# Patient Record
Sex: Female | Born: 1980 | Race: Black or African American | Hispanic: No | Marital: Married | Smoking: Never smoker
Health system: Southern US, Community
[De-identification: ages and names within clinical notes are randomized; demographics above are authoritative.]

## PROBLEM LIST (undated history)

## (undated) ENCOUNTER — Inpatient Hospital Stay (HOSPITAL_COMMUNITY): Payer: Self-pay

## (undated) DIAGNOSIS — R51 Headache: Secondary | ICD-10-CM

## (undated) DIAGNOSIS — F419 Anxiety disorder, unspecified: Secondary | ICD-10-CM

## (undated) DIAGNOSIS — F329 Major depressive disorder, single episode, unspecified: Secondary | ICD-10-CM

## (undated) DIAGNOSIS — I10 Essential (primary) hypertension: Secondary | ICD-10-CM

## (undated) DIAGNOSIS — N159 Renal tubulo-interstitial disease, unspecified: Secondary | ICD-10-CM

## (undated) DIAGNOSIS — F32A Depression, unspecified: Secondary | ICD-10-CM

## (undated) DIAGNOSIS — F909 Attention-deficit hyperactivity disorder, unspecified type: Secondary | ICD-10-CM

## (undated) DIAGNOSIS — Z87442 Personal history of urinary calculi: Secondary | ICD-10-CM

## (undated) DIAGNOSIS — D649 Anemia, unspecified: Secondary | ICD-10-CM

## (undated) DIAGNOSIS — R519 Headache, unspecified: Secondary | ICD-10-CM

## (undated) DIAGNOSIS — N2889 Other specified disorders of kidney and ureter: Secondary | ICD-10-CM

## (undated) DIAGNOSIS — O99340 Other mental disorders complicating pregnancy, unspecified trimester: Secondary | ICD-10-CM

## (undated) HISTORY — DX: Other mental disorders complicating pregnancy, unspecified trimester: O99.340

## (undated) HISTORY — DX: Depression, unspecified: F32.A

## (undated) HISTORY — PX: FEMUR FRACTURE SURGERY: SHX633

---
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
## 2001-09-07 ENCOUNTER — Emergency Department (HOSPITAL_COMMUNITY): Admission: EM | Admit: 2001-09-07 | Discharge: 2001-09-07 | Payer: Self-pay | Admitting: Emergency Medicine

## 2003-04-11 ENCOUNTER — Emergency Department (HOSPITAL_COMMUNITY): Admission: EM | Admit: 2003-04-11 | Discharge: 2003-04-11 | Payer: Self-pay | Admitting: Emergency Medicine

## 2004-09-01 ENCOUNTER — Emergency Department (HOSPITAL_COMMUNITY): Admission: EM | Admit: 2004-09-01 | Discharge: 2004-09-01 | Payer: Self-pay | Admitting: Emergency Medicine

## 2004-09-03 ENCOUNTER — Inpatient Hospital Stay (HOSPITAL_COMMUNITY): Admission: EM | Admit: 2004-09-03 | Discharge: 2004-09-05 | Payer: Self-pay | Admitting: Emergency Medicine

## 2006-02-18 ENCOUNTER — Ambulatory Visit: Payer: Self-pay | Admitting: Internal Medicine

## 2006-02-25 ENCOUNTER — Ambulatory Visit: Payer: Self-pay | Admitting: Internal Medicine

## 2006-02-27 ENCOUNTER — Ambulatory Visit: Payer: Self-pay | Admitting: Internal Medicine

## 2006-03-06 ENCOUNTER — Ambulatory Visit: Payer: Self-pay | Admitting: *Deleted

## 2006-04-10 ENCOUNTER — Ambulatory Visit: Payer: Self-pay | Admitting: Obstetrics & Gynecology

## 2006-04-14 ENCOUNTER — Ambulatory Visit (HOSPITAL_COMMUNITY): Admission: RE | Admit: 2006-04-14 | Discharge: 2006-04-14 | Payer: Self-pay | Admitting: Obstetrics and Gynecology

## 2006-04-24 ENCOUNTER — Ambulatory Visit: Payer: Self-pay | Admitting: Internal Medicine

## 2006-05-16 ENCOUNTER — Ambulatory Visit: Payer: Self-pay | Admitting: Family Medicine

## 2006-05-16 ENCOUNTER — Other Ambulatory Visit: Admission: RE | Admit: 2006-05-16 | Discharge: 2006-05-16 | Payer: Self-pay | Admitting: Obstetrics and Gynecology

## 2006-05-16 ENCOUNTER — Encounter (INDEPENDENT_AMBULATORY_CARE_PROVIDER_SITE_OTHER): Payer: Self-pay | Admitting: Specialist

## 2006-05-30 ENCOUNTER — Ambulatory Visit: Payer: Self-pay | Admitting: Obstetrics and Gynecology

## 2006-10-15 ENCOUNTER — Ambulatory Visit: Payer: Self-pay | Admitting: Internal Medicine

## 2006-10-16 ENCOUNTER — Encounter (INDEPENDENT_AMBULATORY_CARE_PROVIDER_SITE_OTHER): Payer: Self-pay | Admitting: Internal Medicine

## 2006-12-03 ENCOUNTER — Encounter (INDEPENDENT_AMBULATORY_CARE_PROVIDER_SITE_OTHER): Payer: Self-pay | Admitting: *Deleted

## 2007-03-19 DIAGNOSIS — N159 Renal tubulo-interstitial disease, unspecified: Secondary | ICD-10-CM

## 2007-03-19 HISTORY — DX: Renal tubulo-interstitial disease, unspecified: N15.9

## 2010-08-03 NOTE — Consult Note (Signed)
NAME:  [name], [name]                ACCOUNT NO.:  [id number]   MEDICAL RECORD NO.:  [id number]          PATIENT TYPE:  INP   LOCATION:  1429                         FACILITY:  [facility]   PHYSICIAN:  Karol T. Lazarus Salines, M.D. DATE OF BIRTH:  March 20, 1980   DATE OF CONSULTATION:  09/03/2004  DATE OF DISCHARGE:                                   CONSULTATION   CHIEF COMPLAINT:  Severe sore throat.   HISTORY OF PRESENT ILLNESS:  A 30 year old black female presented to the  emergency room Friday, Sunday and again today with severe progressive sore  throat.  She was recommended for ibuprofen and hydration, was given  steroids, but today when she could no longer swallow anything, she was  finally admitted to the hospital and given IV Unasyn.  She has had no prior  antibiotic treatment. She has never had tonsillitis before to her knowledge.  The diagnosis of tonsillitis was not made.  Monospot was negative.  Strep  screen negative x2.  White blood cell count was 19,000 today.  She is  breathing with slight difficulty.  Voice is basically clear.  Swallowing is  very painful and she basically is spitting out all of her secretions.  She  denies referred otalgia.  She does have some tender swelling in her right  upper neck.  She has had fevers as high as 102 degrees F.  No known immune  compromise including diabetes, HIV.  She is not pregnant.   ALLERGIES:  BACTRIM.   PAST MEDICAL HISTORY:  She had a broken nose and a broken femur with a rod  placed.  No current active medical conditions.   SOCIAL HISTORY:  Not reviewed.   FAMILY HISTORY:  Noncontributory.   REVIEW OF SYSTEMS:  Noncontributory.   PHYSICAL EXAMINATION:  GENERAL:  This is a tired, mildly distressed-  appearing, young adult, black female.  Mental status is appropriate.  She is  speaking reluctantly, apparently secondary to discomfort.  HEENT:  The head is atraumatic and neck is supple. Cranial nerves grossly  intact. Ear canals are  clear with normal aerated drums.  Anterior nose is  clear and noncongested.  Oral cavity is reasonably moist with teeth in good  repair.  Oropharynx shows 2+ tonsils with confluent exudate on both sides.  There was no obvious asymmetry of the soft palate or tonsils.  The uvula is  non-edematous and in the midline.  Did not exam the nasopharynx of  hypopharynx.  NECK:  Remarkable for 1-2 cm, tender, right jugular and digastric nodes.   X-RAY DATA:  A CT scan of the neck obtained earlier today shows an irregular  enhancing rim/lucent-centered area in the right paratonsillar pharynx.   IMPRESSION:  Necrotizing tonsillitis with a CT scan lucency but no clinical  evidence of a peritonsillar abscess.   PLAN:  I discussed this with the patient and her family.  I would like her  to continue on IV hydration and Unasyn.  We will recheck a CBC tomorrow.  I  have added Chloraseptic spray for discomfort.  If she develops asymmetry,  she  may require incision and drainage.  The patient understands and agrees.       KTW/MEDQ  D:  09/03/2004  T:  09/04/2004  Job:  [id number]   cc:   Ricki Rodriguez, M.D.  [address]  [place] [address]  Fax: [phone]   Sherin Quarry, MD

## 2010-08-03 NOTE — Group Therapy Note (Signed)
NAME:  [name] [name], [name] [name]                ACCOUNT NO.:  [id number]   MEDICAL RECORD NO.:  [id number]          PATIENT TYPE:  WOC   LOCATION:  [facility]                   FACILITY:  [facility]   PHYSICIAN:  Dorthula Perfect, MD     DATE OF BIRTH:  August 22, 1980   DATE OF SERVICE:  04/10/2006                                  CLINIC NOTE   A 30 year old black female, nulliparous, last menstrual period January  13th, is referred by [facility].  She was seen in [facility] in  December with a history of irregular bleeding and pelvic pain.  Their  evaluation included a negative culture for GC and Chlamydia, a Pap smear  that showed atypical squamous cells of undetermined significance  (azygos) and positive HPV high risk.   The patient has had cyclic menstrual periods.  Each period lasts  approximately 6 days and is described as always being heavy.  She does  stain her underwear.  Since November her periods have been really  painful.  In addition, she feels what sounds like pelvic pressure  pretty much every day of the month.  She last had sexual intercourse in  October (condoms were used) and believes that that encounter was  uncomfortable.   This past year she was treated for a kidney infection in January and  later in the year for a urinary tract infection.  She does have urinary  infrequency.  She, however, has no dysuria or nocturia.  She has a  slight brown vaginal discharge.   Her previous Pap smear was 2 years ago and was abnormal.  She did not  follow up.   PHYSICAL EXAM:  Height 5 feet 1 inch, weight 128, blood pressure 115/69.  ABDOMEN:  Flat and soft and nontender.  No masses are felt.  PELVIC:  External genitalia and BUS glands were normal.  VAGINAL:  Normal.  Wall was epithelialized.  CERVIX:  Epithelialized but there was an area from 4-7 o'clock that  appeared to be somewhat inflamed.  UTERUS:  Slightly retroflexed.  It is nodular and what is thought to be  subserous small fibroids  on midline and left side are tender.  OVARIES:  Normal.  No masses were felt.   1. 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  2. Urinary frequency.  3. Pelvic pain.   DISPOSITION:  1. Clean catch urine for routine and culture.  2. Vaginal ultrasound to assess the uterus.  3. To return in the next few weeks for both result and for colposcopy.  4. She is advised to use ibuprofen or Aleve for the cramping.  5. She is advised, if for some reason she does not follow up with this      visit, that she needs Pap smears every 6 months in the next couple      years.  This surprised her.  I stressed the importance.           ______________________________  Dorthula Perfect, MD     ER/MEDQ  D:  04/10/2006  T:  04/10/2006  Job:  [id number] 

## 2010-08-03 NOTE — H&P (Signed)
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  Her past history is quite  unremarkable. She has some chronic seasonal allergies.  About 3 days ago,  she began to experience an illness characterized by sore throat and  headache. Initially, there was some problems with low back and pelvic pain,  but this seems to have resolved. She first came to the emergency room on  Saturday.  At that time, she had a rapid strep screen done which was  negative and a urine pregnancy test which was negative. No treatment was  actually prescribed at that time.  She then returned to the emergency room  on Sunday with persistent complaints of sore throat.  At that time, she was  given 125 mg of IV Solu-Medrol, 4 mg of Zofran, and 2 mg of morphine and was  discharged on oral Lortabs.  Unfortunately, she has continued to have severe  sore throat and is unable to swallow liquids. She is quite lethargic and  feels dehydrated.  As she now has returned to the emergency room the third  time and states that she cannot take anything by mouth, we have decided to  admit her at this time.  Temperature has been as high as 102. She continues  to describe a dull headache. She has not been having any coughing or  wheezing. There is been no vomiting. She has lost her appetite.  There has  been no diarrhea or dysuria. She has had a normal menstrual period about 2  weeks ago, and has had some vaginal spotting during this illness.  Workup  today, in addition to those studies mentioned above, has also included  a  monospot which was negative, a CBC which shows a white count 19,300 with a  left shift, a BMET that is normal, and a second rapid strep screen which was  negative.   PAST MEDICAL HISTORY:   ALLERGIES:  1.  She is allergic to BACTRIM.  2.  She also has a number of seasonal allergies to MOLDS, DUST, ETC.   MEDICATIONS:  Allegra p.r.n.   OPERATIONS:  At the age of 74, she had a femur fracture and required an  intramedullary rod.   MEDICAL ILLNESSES:  Otherwise none.   FAMILY HISTORY:  Her mother has hypertension. The rest the family seems to  be in good health.   SOCIAL HISTORY:  She does not smoke or abuse alcohol or drugs.   REVIEW OF SYSTEMS:  HEAD:  See above. Ear, nose and throat. She denies  earache or sinus pain. Otherwise see above. CHEST:  See above.  CARDIOVASCULAR:  She denies orthopnea, PND, ankle edema or exertional chest  pain. GI:  See above. GU:  See above. GYN see above. NEURO:  There is no  history of seizure or stroke.   PHYSICAL EXAMINATION:  VITAL SIGNS:  Temperature currently is 99.3, blood  pressure is 130/73, pulse 88, respirations 20.  GENERAL:  She is an acutely ill-appearing woman who is quite tired and  speaks with a somewhat hoarse voice.  HEENT:  The pupils were equal and reactive. Tympanic membranes were clear.  Nares were patent.  NECK:  On examination the pharynx and neck, there is diffuse adenopathy  noted, particularly in the right posterior cervical triangle.  The largest  lymph node is probably about a centimeter in diameter. The pharynx has  exudative changes bilaterally.  There is no obvious swelling suggestive of a  abscess.  CHEST:  Clear.  BACK:  No CVA or point tenderness.  CARDIOVASCULAR:  Reveals normal S1-S2 without rubs, murmurs, or gallops.  ABDOMEN:  Benign. Normal bowel sounds without masses, tenderness,  organomegaly.  NEUROLOGIC TESTING:  Examination of the extremities is normal.   IMPRESSION:  1.  Persistent  pharyngitis.  Differential diagnosis would include      mononucleosis secondary to Epstein-Barr virus or cytomegalovirus or a      streptococcal pharyngitis.  It is noteworthy, however, that she has had      two negative strep screens. Although there is no obvious evidence of      epiglottitis or abscess, I think it would be reasonable to obtain a CT      scan of the neck just to be sure that there is no process like this      going on.  2.  Dehydration secondary to inability to swallow.  3.  History of femur fracture with intramedullary rod.  4.  Seasonal allergies.   PLAN:  The plan will be to obtain a real throat culture. Will repeat  monospot and CMV serology.  Will obtain a CT scan of the neck.  We are going  to empirically place her on IV antibiotics with Unasyn.  Will give her pain  medicine, IV fluids, and continue a program of steroids see if this will  help with the swelling.       SY/MEDQ  D:  09/03/2004  T:  09/03/2004  Job:  [id number]

## 2015-03-19 DIAGNOSIS — O139 Gestational [pregnancy-induced] hypertension without significant proteinuria, unspecified trimester: Secondary | ICD-10-CM

## 2015-03-19 HISTORY — DX: Gestational (pregnancy-induced) hypertension without significant proteinuria, unspecified trimester: O13.9

## 2015-03-19 NOTE — L&D Delivery Note (Signed)
Delivery Note At 12:13 AM a viable female was delivered via Vaginal, Spontaneous Delivery (LOA  ).  APGAR: 8,9 ; weight Pending.   Placenta status: Intact, Shultz, . 3V Cord:  Anesthesia:  None Episiotomy:  None Lacerations:  2nd degree vaginal and right labial Suture Repair: 3.0 vicryl Est. Blood Loss (mL):  150  Mom to postpartum.  Baby to Couplet care / Skin to Skin.  Stacie Diette 11/16/2015, 12:53 AM  Patient is a G3P0020 at [redacted]w[redacted]d who was admitted for IOL due to [place].  She progressed with augmentation via cytotec x 1 dose.  I was gloved and present for delivery in its entirety.  Second stage of labor progressed, baby delivered to SVD;  mild decels during second stage noted.  Complications: none  Lacerations: 2nd deg vag; rt labial  EBL: 150cc  SHAW, KIMBERLY, CNM 11:57 PM

## 2015-05-02 ENCOUNTER — Encounter: Payer: Self-pay | Admitting: Obstetrics and Gynecology

## 2015-05-02 ENCOUNTER — Ambulatory Visit (INDEPENDENT_AMBULATORY_CARE_PROVIDER_SITE_OTHER): Payer: BLUE CROSS/BLUE SHIELD | Admitting: Obstetrics and Gynecology

## 2015-05-02 VITALS — BP 111/71 | HR 68 | Ht 61.0 in | Wt 134.0 lb

## 2015-05-02 DIAGNOSIS — Z36 Encounter for antenatal screening of mother: Secondary | ICD-10-CM | POA: Diagnosis not present

## 2015-05-02 DIAGNOSIS — Z124 Encounter for screening for malignant neoplasm of cervix: Secondary | ICD-10-CM | POA: Diagnosis not present

## 2015-05-02 DIAGNOSIS — Z113 Encounter for screening for infections with a predominantly sexual mode of transmission: Secondary | ICD-10-CM

## 2015-05-02 DIAGNOSIS — Z1151 Encounter for screening for human papillomavirus (HPV): Secondary | ICD-10-CM | POA: Diagnosis not present

## 2015-05-02 DIAGNOSIS — Z3480 Encounter for supervision of other normal pregnancy, unspecified trimester: Secondary | ICD-10-CM | POA: Insufficient documentation

## 2015-05-02 DIAGNOSIS — Z3481 Encounter for supervision of other normal pregnancy, first trimester: Secondary | ICD-10-CM

## 2015-05-02 DIAGNOSIS — Z3490 Encounter for supervision of normal pregnancy, unspecified, unspecified trimester: Secondary | ICD-10-CM

## 2015-05-02 NOTE — Patient Instructions (Signed)
First Trimester of Pregnancy The first trimester of pregnancy is from week 1 until the end of week 12 (months 1 through 3). A week after a sperm fertilizes an egg, the egg will implant on the wall of the uterus. This embryo will begin to develop into a baby. Genes from you and your partner are forming the baby. The female genes determine whether the baby is a boy or a girl. At 6-8 weeks, the eyes and face are formed, and the heartbeat can be seen on ultrasound. At the end of 12 weeks, all the baby's organs are formed.  Now that you are pregnant, you will want to do everything you can to have a healthy baby. Two of the most important things are to get good prenatal care and to follow your health care provider's instructions. Prenatal care is all the medical care you receive before the baby's birth. This care will help prevent, find, and treat any problems during the pregnancy and childbirth. BODY CHANGES Your body goes through many changes during pregnancy. The changes vary from woman to woman.   You may gain or lose a couple of pounds at first.  You may feel sick to your stomach (nauseous) and throw up (vomit). If the vomiting is uncontrollable, call your health care provider.  You may tire easily.  You may develop headaches that can be relieved by medicines approved by your health care provider.  You may urinate more often. Painful urination may mean you have a bladder infection.  You may develop heartburn as a result of your pregnancy.  You may develop constipation because certain hormones are causing the muscles that push waste through your intestines to slow down.  You may develop hemorrhoids or swollen, bulging veins (varicose veins).  Your breasts may begin to grow larger and become tender. Your nipples may stick out more, and the tissue that surrounds them (areola) may become darker.  Your gums may bleed and may be sensitive to brushing and flossing.  Dark spots or blotches  (chloasma, mask of pregnancy) may develop on your face. This will likely fade after the baby is born.  Your menstrual periods will stop.  You may have a loss of appetite.  You may develop cravings for certain kinds of food.  You may have changes in your emotions from day to day, such as being excited to be pregnant or being concerned that something may go wrong with the pregnancy and baby.  You may have more vivid and strange dreams.  You may have changes in your hair. These can include thickening of your hair, rapid growth, and changes in texture. Some women also have hair loss during or after pregnancy, or hair that feels dry or thin. Your hair will most likely return to normal after your baby is born. WHAT TO EXPECT AT YOUR PRENATAL VISITS During a routine prenatal visit:  You will be weighed to make sure you and the baby are growing normally.  Your blood pressure will be taken.  Your abdomen will be measured to track your baby's growth.  The fetal heartbeat will be listened to starting around week 10 or 12 of your pregnancy.  Test results from any previous visits will be discussed. Your health care provider may ask you:  How you are feeling.  If you are feeling the baby move.  If you have had any abnormal symptoms, such as leaking fluid, bleeding, severe headaches, or abdominal cramping.  If you are using any tobacco products,   including cigarettes, chewing tobacco, and electronic cigarettes.  If you have any questions. Other tests that may be performed during your first trimester include:  Blood tests to find your blood type and to check for the presence of any previous infections. They will also be used to check for low iron levels (anemia) and Rh antibodies. Later in the pregnancy, blood tests for diabetes will be done along with other tests if problems develop.  Urine tests to check for infections, diabetes, or protein in the urine.  An ultrasound to confirm the  proper growth and development of the baby.  An amniocentesis to check for possible genetic problems.  Fetal screens for spina bifida and Down syndrome.  You may need other tests to make sure you and the baby are doing well.  HIV (human immunodeficiency virus) testing. Routine prenatal testing includes screening for HIV, unless you choose not to have this test. HOME CARE INSTRUCTIONS  Medicines  Follow your health care provider's instructions regarding medicine use. Specific medicines may be either safe or unsafe to take during pregnancy.  Take your prenatal vitamins as directed.  If you develop constipation, try taking a stool softener if your health care provider approves. Diet  Eat regular, well-balanced meals. Choose a variety of foods, such as meat or vegetable-based protein, fish, milk and low-fat dairy products, vegetables, fruits, and whole grain breads and cereals. Your health care provider will help you determine the amount of weight gain that is right for you.  Avoid raw meat and uncooked cheese. These carry germs that can cause birth defects in the baby.  Eating four or five small meals rather than three large meals a day may help relieve nausea and vomiting. If you start to feel nauseous, eating a few soda crackers can be helpful. Drinking liquids between meals instead of during meals also seems to help nausea and vomiting.  If you develop constipation, eat more high-fiber foods, such as fresh vegetables or fruit and whole grains. Drink enough fluids to keep your urine clear or pale yellow. Activity and Exercise  Exercise only as directed by your health care provider. Exercising will help you:  Control your weight.  Stay in shape.  Be prepared for labor and delivery.  Experiencing pain or cramping in the lower abdomen or low back is a good sign that you should stop exercising. Check with your health care provider before continuing normal exercises.  Try to avoid  standing for long periods of time. Move your legs often if you must stand in one place for a long time.  Avoid heavy lifting.  Wear low-heeled shoes, and practice good posture.  You may continue to have sex unless your health care provider directs you otherwise. Relief of Pain or Discomfort  Wear a good support bra for breast tenderness.   Take warm sitz baths to soothe any pain or discomfort caused by hemorrhoids. Use hemorrhoid cream if your health care provider approves.   Rest with your legs elevated if you have leg cramps or low back pain.  If you develop varicose veins in your legs, wear support hose. Elevate your feet for 15 minutes, 3-4 times a day. Limit salt in your diet. Prenatal Care  Schedule your prenatal visits by the twelfth week of pregnancy. They are usually scheduled monthly at first, then more often in the last 2 months before delivery.  Write down your questions. Take them to your prenatal visits.  Keep all your prenatal visits as directed by your   health care provider. Safety  Wear your seat belt at all times when driving.  Make a list of emergency phone numbers, including numbers for family, friends, the hospital, and police and fire departments. General Tips  Ask your health care provider for a referral to a local prenatal education class. Begin classes no later than at the beginning of month 6 of your pregnancy.  Ask for help if you have counseling or nutritional needs during pregnancy. Your health care provider can offer advice or refer you to specialists for help with various needs.  Do not use hot tubs, steam rooms, or saunas.  Do not douche or use tampons or scented sanitary pads.  Do not cross your legs for long periods of time.  Avoid cat litter boxes and soil used by cats. These carry germs that can cause birth defects in the baby and possibly loss of the fetus by miscarriage or stillbirth.  Avoid all smoking, herbs, alcohol, and medicines  not prescribed by your health care provider. Chemicals in these affect the formation and growth of the baby.  Do not use any tobacco products, including cigarettes, chewing tobacco, and electronic cigarettes. If you need help quitting, ask your health care provider. You may receive counseling support and other resources to help you quit.  Schedule a dentist appointment. At home, brush your teeth with a soft toothbrush and be gentle when you floss. SEEK MEDICAL CARE IF:   You have dizziness.  You have mild pelvic cramps, pelvic pressure, or nagging pain in the abdominal area.  You have persistent nausea, vomiting, or diarrhea.  You have a bad smelling vaginal discharge.  You have pain with urination.  You notice increased swelling in your face, hands, legs, or ankles. SEEK IMMEDIATE MEDICAL CARE IF:   You have a fever.  You are leaking fluid from your vagina.  You have spotting or bleeding from your vagina.  You have severe abdominal cramping or pain.  You have rapid weight gain or loss.  You vomit blood or material that looks like coffee grounds.  You are exposed to German measles and have never had them.  You are exposed to fifth disease or chickenpox.  You develop a severe headache.  You have shortness of breath.  You have any kind of trauma, such as from a fall or a car accident.   This information is not intended to replace advice given to you by your health care provider. Make sure you discuss any questions you have with your health care provider.   Document Released: 02/26/2001 Document Revised: 03/25/2014 Document Reviewed: 01/12/2013 Elsevier Interactive Patient Education 2016 [employer]  Contraception Choices Contraception (birth control) is the use of any methods or devices to prevent pregnancy. Below are some methods to help avoid pregnancy. HORMONAL METHODS   Contraceptive implant. This is a thin, plastic tube containing progesterone hormone. It does  not contain estrogen hormone. Your health care provider inserts the tube in the inner part of the upper arm. The tube can remain in place for up to 3 years. After 3 years, the implant must be removed. The implant prevents the ovaries from releasing an egg (ovulation), thickens the cervical mucus to prevent sperm from entering the uterus, and thins the lining of the inside of the uterus.  Progesterone-only injections. These injections are given every 3 months by your health care provider to prevent pregnancy. This synthetic progesterone hormone stops the ovaries from releasing eggs. It also thickens cervical mucus and changes the   uterine lining. This makes it harder for sperm to survive in the uterus.  Birth control pills. These pills contain estrogen and progesterone hormone. They work by preventing the ovaries from releasing eggs (ovulation). They also cause the cervical mucus to thicken, preventing the sperm from entering the uterus. Birth control pills are prescribed by a health care provider.Birth control pills can also be used to treat heavy periods.  Minipill. This type of birth control pill contains only the progesterone hormone. They are taken every day of each month and must be prescribed by your health care provider.  Birth control patch. The patch contains hormones similar to those in birth control pills. It must be changed once a week and is prescribed by a health care provider.  Vaginal ring. The ring contains hormones similar to those in birth control pills. It is left in the vagina for 3 weeks, removed for 1 week, and then a new one is put back in place. The patient must be comfortable inserting and removing the ring from the vagina.A health care provider's prescription is necessary.  Emergency contraception. Emergency contraceptives prevent pregnancy after unprotected sexual intercourse. This pill can be taken right after sex or up to 5 days after unprotected sex. It is most effective  the sooner you take the pills after having sexual intercourse. Most emergency contraceptive pills are available without a prescription. Check with your pharmacist. Do not use emergency contraception as your only form of birth control. BARRIER METHODS   Female condom. This is a thin sheath (latex or rubber) that is worn over the penis during sexual intercourse. It can be used with spermicide to increase effectiveness.  Female condom. This is a soft, loose-fitting sheath that is put into the vagina before sexual intercourse.  Diaphragm. This is a soft, latex, dome-shaped barrier that must be fitted by a health care provider. It is inserted into the vagina, along with a spermicidal jelly. It is inserted before intercourse. The diaphragm should be left in the vagina for 6 to 8 hours after intercourse.  Cervical cap. This is a round, soft, latex or plastic cup that fits over the cervix and must be fitted by a health care provider. The cap can be left in place for up to 48 hours after intercourse.  Sponge. This is a soft, circular piece of polyurethane foam. The sponge has spermicide in it. It is inserted into the vagina after wetting it and before sexual intercourse.  Spermicides. These are chemicals that kill or block sperm from entering the cervix and uterus. They come in the form of creams, jellies, suppositories, foam, or tablets. They do not require a prescription. They are inserted into the vagina with an applicator before having sexual intercourse. The process must be repeated every time you have sexual intercourse. INTRAUTERINE CONTRACEPTION  Intrauterine device (IUD). This is a T-shaped device that is put in a woman's uterus during a menstrual period to prevent pregnancy. There are 2 types:  Copper IUD. This type of IUD is wrapped in copper wire and is placed inside the uterus. Copper makes the uterus and fallopian tubes produce a fluid that kills sperm. It can stay in place for 10  years.  Hormone IUD. This type of IUD contains the hormone progestin (synthetic progesterone). The hormone thickens the cervical mucus and prevents sperm from entering the uterus, and it also thins the uterine lining to prevent implantation of a fertilized egg. The hormone can weaken or kill the sperm that get into the   uterus. It can stay in place for 3-5 years, depending on which type of IUD is used. PERMANENT METHODS OF CONTRACEPTION  Female tubal ligation. This is when the woman's fallopian tubes are surgically sealed, tied, or blocked to prevent the egg from traveling to the uterus.  Hysteroscopic sterilization. This involves placing a small coil or insert into each fallopian tube. Your doctor uses a technique called hysteroscopy to do the procedure. The device causes scar tissue to form. This results in permanent blockage of the fallopian tubes, so the sperm cannot fertilize the egg. It takes about 3 months after the procedure for the tubes to become blocked. You must use another form of birth control for these 3 months.  Female sterilization. This is when the female has the tubes that carry sperm tied off (vasectomy).This blocks sperm from entering the vagina during sexual intercourse. After the procedure, the man can still ejaculate fluid (semen). NATURAL PLANNING METHODS  Natural family planning. This is not having sexual intercourse or using a barrier method (condom, diaphragm, cervical cap) on days the woman could become pregnant.  Calendar method. This is keeping track of the length of each menstrual cycle and identifying when you are fertile.  Ovulation method. This is avoiding sexual intercourse during ovulation.  Symptothermal method. This is avoiding sexual intercourse during ovulation, using a thermometer and ovulation symptoms.  Post-ovulation method. This is timing sexual intercourse after you have ovulated. Regardless of which type or method of contraception you choose, it is  important that you use condoms to protect against the transmission of sexually transmitted infections (STIs). Talk with your health care provider about which form of contraception is most appropriate for you.   This information is not intended to replace advice given to you by your health care provider. Make sure you discuss any questions you have with your health care provider.   Document Released: 03/04/2005 Document Revised: 03/09/2013 Document Reviewed: 08/27/2012 Elsevier Interactive Patient Education 2016 [employer]  Breastfeeding Deciding to breastfeed is one of the best choices you can make for you and your baby. A change in hormones during pregnancy causes your breast tissue to grow and increases the number and size of your milk ducts. These hormones also allow proteins, sugars, and fats from your blood supply to make breast milk in your milk-producing glands. Hormones prevent breast milk from being released before your baby is born as well as prompt milk flow after birth. Once breastfeeding has begun, thoughts of your baby, as well as his or her sucking or crying, can stimulate the release of milk from your milk-producing glands.  BENEFITS OF BREASTFEEDING For Your Baby  Your first milk (colostrum) helps your baby's digestive system function better.  There are antibodies in your milk that help your baby fight off infections.  Your baby has a lower incidence of asthma, allergies, and sudden infant death syndrome.  The nutrients in breast milk are better for your baby than infant formulas and are designed uniquely for your baby's needs.  Breast milk improves your baby's brain development.  Your baby is less likely to develop other conditions, such as childhood obesity, asthma, or type 2 diabetes mellitus. For You  Breastfeeding helps to create a very special bond between you and your baby.  Breastfeeding is convenient. Breast milk is always available at the correct temperature  and costs nothing.  Breastfeeding helps to burn calories and helps you lose the weight gained during pregnancy.  Breastfeeding makes your uterus contract to its   prepregnancy size faster and slows bleeding (lochia) after you give birth.   Breastfeeding helps to lower your risk of developing type 2 diabetes mellitus, osteoporosis, and breast or ovarian cancer later in life. SIGNS THAT YOUR BABY IS HUNGRY Early Signs of Hunger  Increased alertness or activity.  Stretching.  Movement of the head from side to side.  Movement of the head and opening of the mouth when the corner of the mouth or cheek is stroked (rooting).  Increased sucking sounds, smacking lips, cooing, sighing, or squeaking.  Hand-to-mouth movements.  Increased sucking of fingers or hands. Late Signs of Hunger  Fussing.  Intermittent crying. Extreme Signs of Hunger Signs of extreme hunger will require calming and consoling before your baby will be able to breastfeed successfully. Do not wait for the following signs of extreme hunger to occur before you initiate breastfeeding:  Restlessness.  A loud, strong cry.  Screaming. BREASTFEEDING BASICS Breastfeeding Initiation  Find a comfortable place to sit or lie down, with your neck and back well supported.  Place a pillow or rolled up blanket under your baby to bring him or her to the level of your breast (if you are seated). Nursing pillows are specially designed to help support your arms and your baby while you breastfeed.  Make sure that your baby's abdomen is facing your abdomen.  Gently massage your breast. With your fingertips, massage from your chest wall toward your nipple in a circular motion. This encourages milk flow. You may need to continue this action during the feeding if your milk flows slowly.  Support your breast with 4 fingers underneath and your thumb above your nipple. Make sure your fingers are well away from your nipple and your baby's  mouth.  Stroke your baby's lips gently with your finger or nipple.  When your baby's mouth is open wide enough, quickly bring your baby to your breast, placing your entire nipple and as much of the colored area around your nipple (areola) as possible into your baby's mouth.  More areola should be visible above your baby's upper lip than below the lower lip.  Your baby's tongue should be between his or her lower gum and your breast.  Ensure that your baby's mouth is correctly positioned around your nipple (latched). Your baby's lips should create a seal on your breast and be turned out (everted).  It is common for your baby to suck about 2-3 minutes in order to start the flow of breast milk. Latching Teaching your baby how to latch on to your breast properly is very important. An improper latch can cause nipple pain and decreased milk supply for you and poor weight gain in your baby. Also, if your baby is not latched onto your nipple properly, he or she may swallow some air during feeding. This can make your baby fussy. Burping your baby when you switch breasts during the feeding can help to get rid of the air. However, teaching your baby to latch on properly is still the best way to prevent fussiness from swallowing air while breastfeeding. Signs that your baby has successfully latched on to your nipple:  Silent tugging or silent sucking, without causing you pain.  Swallowing heard between every 3-4 sucks.  Muscle movement above and in front of his or her ears while sucking. Signs that your baby has not successfully latched on to nipple:  Sucking sounds or smacking sounds from your baby while breastfeeding.  Nipple pain. If you think your baby has   not latched on correctly, slip your finger into the corner of your baby's mouth to break the suction and place it between your baby's gums. Attempt breastfeeding initiation again. Signs of Successful Breastfeeding Signs from your baby:  A  gradual decrease in the number of sucks or complete cessation of sucking.  Falling asleep.  Relaxation of his or her body.  Retention of a small amount of milk in his or her mouth.  Letting go of your breast by himself or herself. Signs from you:  Breasts that have increased in firmness, weight, and size 1-3 hours after feeding.  Breasts that are softer immediately after breastfeeding.  Increased milk volume, as well as a change in milk consistency and color by the fifth day of breastfeeding.  Nipples that are not sore, cracked, or bleeding. Signs That Your Baby is Getting Enough Milk  Wetting at least 3 diapers in a 24-hour period. The urine should be clear and pale yellow by age 5 days.  At least 3 stools in a 24-hour period by age 5 days. The stool should be soft and yellow.  At least 3 stools in a 24-hour period by age 7 days. The stool should be seedy and yellow.  No loss of weight greater than 10% of birth weight during the first 3 days of age.  Average weight gain of 4-7 ounces (113-198 g) per week after age 4 days.  Consistent daily weight gain by age 5 days, without weight loss after the age of 2 weeks. After a feeding, your baby may spit up a small amount. This is common. BREASTFEEDING FREQUENCY AND DURATION Frequent feeding will help you make more milk and can prevent sore nipples and breast engorgement. Breastfeed when you feel the need to reduce the fullness of your breasts or when your baby shows signs of hunger. This is called "breastfeeding on demand." Avoid introducing a pacifier to your baby while you are working to establish breastfeeding (the first 4-6 weeks after your baby is born). After this time you may choose to use a pacifier. Research has shown that pacifier use during the first year of a baby's life decreases the risk of sudden infant death syndrome (SIDS). Allow your baby to feed on each breast as long as he or she wants. Breastfeed until your baby is  finished feeding. When your baby unlatches or falls asleep while feeding from the first breast, offer the second breast. Because newborns are often sleepy in the first few weeks of life, you may need to awaken your baby to get him or her to feed. Breastfeeding times will vary from baby to baby. However, the following rules can serve as a guide to help you ensure that your baby is properly fed:  Newborns (babies 4 weeks of age or younger) may breastfeed every 1-3 hours.  Newborns should not go longer than 3 hours during the day or 5 hours during the night without breastfeeding.  You should breastfeed your baby a minimum of 8 times in a 24-hour period until you begin to introduce solid foods to your baby at around 6 months of age. BREAST MILK PUMPING Pumping and storing breast milk allows you to ensure that your baby is exclusively fed your breast milk, even at times when you are unable to breastfeed. This is especially important if you are going back to work while you are still breastfeeding or when you are not able to be present during feedings. Your lactation consultant can give you guidelines on   how long it is safe to store breast milk. A breast pump is a machine that allows you to pump milk from your breast into a sterile bottle. The pumped breast milk can then be stored in a refrigerator or freezer. Some breast pumps are operated by hand, while others use electricity. Ask your lactation consultant which type will work best for you. Breast pumps can be purchased, but some hospitals and breastfeeding support groups lease breast pumps on a monthly basis. A lactation consultant can teach you how to hand express breast milk, if you prefer not to use a pump. CARING FOR YOUR BREASTS WHILE YOU BREASTFEED Nipples can become dry, cracked, and sore while breastfeeding. The following recommendations can help keep your breasts moisturized and healthy:  Avoid using soap on your nipples.  Wear a supportive bra.  Although not required, special nursing bras and tank tops are designed to allow access to your breasts for breastfeeding without taking off your entire bra or top. Avoid wearing underwire-style bras or extremely tight bras.  Air dry your nipples for 3-4minutes after each feeding.  Use only cotton bra pads to absorb leaked breast milk. Leaking of breast milk between feedings is normal.  Use lanolin on your nipples after breastfeeding. Lanolin helps to maintain your skin's normal moisture barrier. If you use pure lanolin, you do not need to wash it off before feeding your baby again. Pure lanolin is not toxic to your baby. You may also hand express a few drops of breast milk and gently massage that milk into your nipples and allow the milk to air dry. In the first few weeks after giving birth, some women experience extremely full breasts (engorgement). Engorgement can make your breasts feel heavy, warm, and tender to the touch. Engorgement peaks within 3-5 days after you give birth. The following recommendations can help ease engorgement:  Completely empty your breasts while breastfeeding or pumping. You may want to start by applying warm, moist heat (in the shower or with warm water-soaked hand towels) just before feeding or pumping. This increases circulation and helps the milk flow. If your baby does not completely empty your breasts while breastfeeding, pump any extra milk after he or she is finished.  Wear a snug bra (nursing or regular) or tank top for 1-2 days to signal your body to slightly decrease milk production.  Apply ice packs to your breasts, unless this is too uncomfortable for you.  Make sure that your baby is latched on and positioned properly while breastfeeding. If engorgement persists after 48 hours of following these recommendations, contact your health care provider or a lactation consultant. OVERALL HEALTH CARE RECOMMENDATIONS WHILE BREASTFEEDING  Eat healthy foods.  Alternate between meals and snacks, eating 3 of each per day. Because what you eat affects your breast milk, some of the foods may make your baby more irritable than usual. Avoid eating these foods if you are sure that they are negatively affecting your baby.  Drink milk, fruit juice, and water to satisfy your thirst (about 10 glasses a day).  Rest often, relax, and continue to take your prenatal vitamins to prevent fatigue, stress, and anemia.  Continue breast self-awareness checks.  Avoid chewing and smoking tobacco. Chemicals from cigarettes that pass into breast milk and exposure to secondhand smoke may harm your baby.  Avoid alcohol and drug use, including marijuana. Some medicines that may be harmful to your baby can pass through breast milk. It is important to ask your health care provider   before taking any medicine, including all over-the-counter and prescription medicine as well as vitamin and herbal supplements. It is possible to become pregnant while breastfeeding. If birth control is desired, ask your health care provider about options that will be safe for your baby. SEEK MEDICAL CARE IF:  You feel like you want to stop breastfeeding or have become frustrated with breastfeeding.  You have painful breasts or nipples.  Your nipples are cracked or bleeding.  Your breasts are red, tender, or warm.  You have a swollen area on either breast.  You have a fever or chills.  You have nausea or vomiting.  You have drainage other than breast milk from your nipples.  Your breasts do not become full before feedings by the fifth day after you give birth.  You feel sad and depressed.  Your baby is too sleepy to eat well.  Your baby is having trouble sleeping.   Your baby is wetting less than 3 diapers in a 24-hour period.  Your baby has less than 3 stools in a 24-hour period.  Your baby's skin or the white part of his or her eyes becomes yellow.   Your baby is not gaining  weight by 5 days of age. SEEK IMMEDIATE MEDICAL CARE IF:  Your baby is overly tired (lethargic) and does not want to wake up and feed.  Your baby develops an unexplained fever.   This information is not intended to replace advice given to you by your health care provider. Make sure you discuss any questions you have with your health care provider.   Document Released: 03/04/2005 Document Revised: 11/23/2014 Document Reviewed: 08/26/2012 Elsevier Interactive Patient Education 2016 [employer]  

## 2015-05-02 NOTE — Progress Notes (Signed)
   Subjective:    [name] [name] is a G3P0020 [redacted]w[redacted]d being seen today for her first obstetrical visit.  Her obstetrical history is significant for 2 first trimester miscarriage. Patient does intend to breast feed. Pregnancy history fully reviewed.  Patient reports nausea and vomiting.  Filed Vitals:   05/02/15 0935 05/02/15 0936  BP: 111/71   Pulse: 68   Height:  5\' 1"  (1.549 m)  Weight: 134 lb (60.782 kg)     HISTORY: OB History  Gravida Para Term Preterm AB SAB TAB Ectopic Multiple Living  3    2 2     0    # Outcome Date GA Lbr Len/2nd Weight Sex Delivery Anes PTL Lv  3 Current           2 SAB           1 SAB              History reviewed. No pertinent past medical history. Past Surgical History  Procedure Laterality Date  . Leg surgery Left 1999    steel rod in left femur bone    Family History  Problem Relation Age of Onset  . Hyperlipidemia Mother   . Heart disease Maternal Aunt   . Heart disease Maternal Uncle   . Heart disease Paternal Aunt   . Heart disease Maternal Grandfather   . Heart disease Paternal Grandfather      Exam    Uterus:     Pelvic Exam:    Perineum: Normal Perineum   Vulva: normal   Vagina:  normal mucosa, normal discharge   pH:    Cervix: closed and long   Adnexa: normal adnexa and no mass, fullness, tenderness   Bony Pelvis: gynecoid  System: Breast:  normal appearance, no masses or tenderness   Skin: normal coloration and turgor, no rashes    Neurologic: oriented, no focal deficits   Extremities: normal strength, tone, and muscle mass   HEENT extra ocular movement intact   Mouth/Teeth mucous membranes moist, pharynx normal without lesions and dental hygiene good   Neck supple and no masses   Cardiovascular: regular rate and rhythm   Respiratory:  chest clear, no wheezing, crepitations, rhonchi, normal symmetric air entry   Abdomen: soft, non-tender; bowel sounds normal; no masses,  no organomegaly   Urinary:        Assessment:    Pregnancy: [id number] Patient Active Problem List   Diagnosis Date Noted  . Supervision of normal pregnancy, antepartum 05/02/2015        Plan:     Initial labs drawn. Prenatal vitamins. Problem list reviewed and updated. Genetic Screening discussed First Screen: declined.  Ultrasound discussed; fetal survey: requested.  Follow up in 4 weeks. 50% of 30 min visit spent on counseling and coordination of care.     [name] [name] 05/02/2015

## 2015-05-02 NOTE — Progress Notes (Signed)
Bedside ultrasound today measures [redacted]w[redacted]d fetus with heartbeat.

## 2015-05-02 NOTE — Addendum Note (Signed)
Addended by: Lin Landsman C on: 05/02/2015 02:54 PM   Modules accepted: Orders

## 2015-05-03 LAB — CYTOLOGY - PAP

## 2015-05-05 ENCOUNTER — Other Ambulatory Visit (INDEPENDENT_AMBULATORY_CARE_PROVIDER_SITE_OTHER): Payer: BLUE CROSS/BLUE SHIELD | Admitting: *Deleted

## 2015-05-05 DIAGNOSIS — Z36 Encounter for antenatal screening of mother: Secondary | ICD-10-CM

## 2015-05-05 LAB — CULTURE, OB URINE: Colony Count: 2000

## 2015-05-05 NOTE — Progress Notes (Signed)
Pt here today for Prenatal Labs.

## 2015-05-06 ENCOUNTER — Encounter: Payer: Self-pay | Admitting: Obstetrics and Gynecology

## 2015-05-06 DIAGNOSIS — R8271 Bacteriuria: Secondary | ICD-10-CM | POA: Insufficient documentation

## 2015-05-06 LAB — SICKLE CELL SCREEN: Sickle Cell Screen: NEGATIVE

## 2015-05-08 LAB — PRENATAL PROFILE (SOLSTAS)
Antibody Screen: NEGATIVE
Basophils Absolute: 0 10*3/uL (ref 0.0–0.1)
Basophils Relative: 0 % (ref 0–1)
Eosinophils Absolute: 0.1 10*3/uL (ref 0.0–0.7)
Eosinophils Relative: 1 % (ref 0–5)
HCT: 33.2 % — ABNORMAL LOW (ref 36.0–46.0)
HIV 1&2 Ab, 4th Generation: NONREACTIVE
Hemoglobin: 11.1 g/dL — ABNORMAL LOW (ref 12.0–15.0)
Hepatitis B Surface Ag: NEGATIVE
Lymphocytes Relative: 21 % (ref 12–46)
Lymphs Abs: 1.3 10*3/uL (ref 0.7–4.0)
MCH: 29.8 pg (ref 26.0–34.0)
MCHC: 33.4 g/dL (ref 30.0–36.0)
MCV: 89 fL (ref 78.0–100.0)
MPV: 9.6 fL (ref 8.6–12.4)
Monocytes Absolute: 0.4 10*3/uL (ref 0.1–1.0)
Monocytes Relative: 7 % (ref 3–12)
Neutro Abs: 4.5 10*3/uL (ref 1.7–7.7)
Neutrophils Relative %: 71 % (ref 43–77)
Platelets: 287 10*3/uL (ref 150–400)
RBC: 3.73 MIL/uL — ABNORMAL LOW (ref 3.87–5.11)
RDW: 13.1 % (ref 11.5–15.5)
Rh Type: POSITIVE
Rubella: 3.04 Index — ABNORMAL HIGH (ref <0.90)
WBC: 6.3 10*3/uL (ref 4.0–10.5)

## 2015-05-09 LAB — CYSTIC FIBROSIS DIAGNOSTIC STUDY

## 2015-05-24 ENCOUNTER — Ambulatory Visit (INDEPENDENT_AMBULATORY_CARE_PROVIDER_SITE_OTHER): Payer: Medicaid Other | Admitting: Certified Nurse Midwife

## 2015-05-24 VITALS — BP 130/78 | HR 93 | Wt 135.0 lb

## 2015-05-24 DIAGNOSIS — G43909 Migraine, unspecified, not intractable, without status migrainosus: Secondary | ICD-10-CM | POA: Insufficient documentation

## 2015-05-24 DIAGNOSIS — Z3481 Encounter for supervision of other normal pregnancy, first trimester: Secondary | ICD-10-CM

## 2015-05-24 DIAGNOSIS — G43911 Migraine, unspecified, intractable, with status migrainosus: Secondary | ICD-10-CM

## 2015-05-24 DIAGNOSIS — R350 Frequency of micturition: Secondary | ICD-10-CM | POA: Diagnosis not present

## 2015-05-24 DIAGNOSIS — R319 Hematuria, unspecified: Secondary | ICD-10-CM

## 2015-05-24 DIAGNOSIS — Z3482 Encounter for supervision of other normal pregnancy, second trimester: Secondary | ICD-10-CM

## 2015-05-24 LAB — POCT URINALYSIS DIPSTICK
Bilirubin, UA: NEGATIVE
Glucose, UA: NEGATIVE
Ketones, UA: NEGATIVE
Nitrite, UA: NEGATIVE
Spec Grav, UA: 1.015
Urobilinogen, UA: NEGATIVE
pH, UA: 7

## 2015-05-24 MED ORDER — BUTALBITAL-APAP-CAFF-COD 50-325-40-30 MG PO CAPS: 1.0000 | ORAL_CAPSULE | ORAL | Status: DC | PRN

## 2015-05-24 NOTE — Progress Notes (Signed)
Pt has h/o migraines and feels lots of urinary pressure

## 2015-05-24 NOTE — Progress Notes (Signed)
Subjective:  [name] [name] is a 35 y.o. G3P0020 at [redacted]w[redacted]d being seen today for ongoing prenatal care.  She is currently monitored for the following issues for this low-risk pregnancy and has Supervision of normal pregnancy, antepartum; GBS bacteriuria; Hematuria of undiagnosed cause; and Migraine headache on her problem list.  Patient reports headache.  Contractions: Not present. Vag. Bleeding: None.  Movement: Absent. Denies leaking of fluid.   The following portions of the patient's history were reviewed and updated as appropriate: allergies, current medications, past family history, past medical history, past social history, past surgical history and problem list. Problem list updated.  Objective:   Filed Vitals:   05/24/15 1423  BP: 130/78  Pulse: 93  Weight: 135 lb (61.236 kg)    Fetal Status: Fetal Heart Rate (bpm): 154   Movement: Absent     General:  Alert, oriented and cooperative. Patient is in no acute distress.  Skin: Skin is warm and dry. No rash noted.   Cardiovascular: Normal heart rate noted  Respiratory: Normal respiratory effort, no problems with respiration noted  Abdomen: Soft, gravid, appropriate for gestational age. Pain/Pressure: Absent     Pelvic: Vag. Bleeding: None Vag D/C Character: Thin   Cervical exam deferred        Extremities: Normal range of motion.  Edema: None  Mental Status: Normal mood and affect. Normal behavior. Normal judgment and thought content.   Urinalysis: Urine Protein: Negative Urine Glucose: Negative  Assessment and Plan:  Pregnancy: G3P0020 at [redacted]w[redacted]d  1. Urine frequency  - POCT urinalysis dipstick - CULTURE, URINE COMPREHENSIVE  2. Hematuria of undiagnosed cause   3. Supervision of normal pregnancy, antepartum, first trimester    4. Intractable migraine with status migrainosus, unspecified migraine type   Preterm labor symptoms and general obstetric precautions including but not limited to vaginal bleeding, contractions,  leaking of fluid and fetal movement were reviewed in detail with the patient. Please refer to After Visit Summary for other counseling recommendations.  Return in about 4 weeks (around 06/21/2015).   Larey Days, CNM

## 2015-05-24 NOTE — Patient Instructions (Signed)
General Headache Without Cause A headache is pain or discomfort felt around the head or neck area. There are many causes and types of headaches. In some cases, the cause may not be found.  HOME CARE  Managing Pain  Take over-the-counter and prescription medicines only as told by your doctor.  Lie down in a dark, quiet room when you have a headache.  If directed, apply ice to the head and neck area:  Put ice in a plastic bag.  Place a towel between your skin and the bag.  Leave the ice on for 20 minutes, 2-3 times per day.  Use a heating pad or hot shower to apply heat to the head and neck area as told by your doctor.  Keep lights dim if bright lights bother you or make your headaches worse. Eating and Drinking  Eat meals on a regular schedule.  Lessen how much alcohol you drink.  Lessen how much caffeine you drink, or stop drinking caffeine. General Instructions  Keep all follow-up visits as told by your doctor. This is important.  Keep a journal to find out if certain things bring on headaches. For example, write down:  What you eat and drink.  How much sleep you get.  Any change to your diet or medicines.  Relax by getting a massage or doing other relaxing activities.  Lessen stress.  Sit up straight. Do not tighten (tense) your muscles.  Do not use tobacco products. This includes cigarettes, chewing tobacco, or e-cigarettes. If you need help quitting, ask your doctor.  Exercise regularly as told by your doctor.  Get enough sleep. This often means 7-9 hours of sleep. GET HELP IF:  Your symptoms are not helped by medicine.  You have a headache that feels different than the other headaches.  You feel sick to your stomach (nauseous) or you throw up (vomit).  You have a fever. GET HELP RIGHT AWAY IF:   Your headache becomes really bad.  You keep throwing up.  You have a stiff neck.  You have trouble seeing.  You have trouble speaking.  You have  pain in the eye or ear.  Your muscles are weak or you lose muscle control.  You lose your balance or have trouble walking.  You feel like you will pass out (faint) or you pass out.  You have confusion.   This information is not intended to replace advice given to you by your health care provider. Make sure you discuss any questions you have with your health care provider.   Document Released: 12/12/2007 Document Revised: 11/23/2014 Document Reviewed: 06/27/2014 Elsevier Interactive Patient Education 2016 [place]. Pregnancy and Urinary Tract Infection A urinary tract infection (UTI) is a bacterial infection of the urinary tract. Infection of the urinary tract can include the ureters, kidneys (pyelonephritis), bladder (cystitis), and urethra (urethritis). All pregnant women should be screened for bacteria in the urinary tract. Identifying and treating a UTI will decrease the risk of preterm labor and developing more serious infections in both the mother and baby. CAUSES Bacteria germs cause almost all UTIs.  RISK FACTORS Many factors can increase your chances of getting a UTI during pregnancy. These include:  Having a short urethra.  Poor toilet and hygiene habits.  Sexual intercourse.  Blockage of urine along the urinary tract.  Problems with the pelvic muscles or nerves.  Diabetes.  Obesity.  Bladder problems after having several children.  Previous history of UTI. SIGNS AND SYMPTOMS   Pain, burning,  or a stinging feeling when urinating.  Suddenly feeling the need to urinate right away (urgency).  Loss of bladder control (urinary incontinence).  Frequent urination, more than is common with pregnancy.  Lower abdominal or back discomfort.  Cloudy urine.  Blood in the urine (hematuria).  Fever. When the kidneys are infected, the symptoms may be:  Back pain.  Flank pain on the right side more so than the  left.  Fever.  Chills.  Nausea.  Vomiting. DIAGNOSIS  A urinary tract infection is usually diagnosed through urine tests. Additional tests and procedures are sometimes done. These may include:  Ultrasound exam of the kidneys, ureters, bladder, and urethra.  Looking in the bladder with a lighted tube (cystoscopy). TREATMENT Typically, UTIs can be treated with antibiotic medicines.  HOME CARE INSTRUCTIONS   Only take over-the-counter or prescription medicines as directed by your health care provider. If you were prescribed antibiotics, take them as directed. Finish them even if you start to feel better.  Drink enough fluids to keep your urine clear or pale yellow.  Do not have sexual intercourse until the infection is gone and your health care provider says it is okay.  Make sure you are tested for UTIs throughout your pregnancy. These infections often come back. Preventing a UTI in the Future  Practice good toilet habits. Always wipe from front to back. Use the tissue only once.  Do not hold your urine. Empty your bladder as soon as possible when the urge comes.  Do not douche or use deodorant sprays.  Wash with soap and warm water around the genital area and the anus.  Empty your bladder before and after sexual intercourse.  Wear underwear with a cotton crotch.  Avoid caffeine and carbonated drinks. They can irritate the bladder.  Drink cranberry juice or take cranberry pills. This may decrease the risk of getting a UTI.  Do not drink alcohol.  Keep all your appointments and tests as scheduled. SEEK MEDICAL CARE IF:   Your symptoms get worse.  You are still having fevers 2 or more days after treatment begins.  You have a rash.  You feel that you are having problems with medicines prescribed.  You have abnormal vaginal discharge. SEEK IMMEDIATE MEDICAL CARE IF:   You have back or flank pain.  You have chills.  You have blood in your urine.  You have  nausea and vomiting.  You have contractions of your uterus.  You have a gush of fluid from the vagina. MAKE SURE YOU:  Understand these instructions.   Will watch your condition.   Will get help right away if you are not doing well or get worse.    This information is not intended to replace advice given to you by your health care provider. Make sure you discuss any questions you have with your health care provider.   Document Released: 06/29/2010 Document Revised: 12/23/2012 Document Reviewed: 10/01/2012 Elsevier Interactive Patient Education [employer].

## 2015-05-24 NOTE — Progress Notes (Signed)
Increase in headaches and feels like she has UTI

## 2015-05-26 LAB — CULTURE, URINE COMPREHENSIVE
Colony Count: NO GROWTH
Organism ID, Bacteria: NO GROWTH

## 2015-05-30 ENCOUNTER — Encounter: Payer: BLUE CROSS/BLUE SHIELD | Admitting: Family Medicine

## 2015-06-14 ENCOUNTER — Ambulatory Visit (INDEPENDENT_AMBULATORY_CARE_PROVIDER_SITE_OTHER): Payer: Self-pay | Admitting: Certified Nurse Midwife

## 2015-06-14 ENCOUNTER — Telehealth: Payer: Self-pay | Admitting: *Deleted

## 2015-06-14 VITALS — BP 103/62 | HR 103 | Wt 132.0 lb

## 2015-06-14 DIAGNOSIS — N898 Other specified noninflammatory disorders of vagina: Secondary | ICD-10-CM

## 2015-06-14 DIAGNOSIS — Z3482 Encounter for supervision of other normal pregnancy, second trimester: Secondary | ICD-10-CM

## 2015-06-14 NOTE — Telephone Encounter (Signed)
Pt is [redacted] wks pregnant, c/o dark brown discharge and abdominal discomfort, not really sure how the pain feels and is very anxious and sounded tearful over the phone.  Reassured her that brown discharge is usually fine as long as she is not experiencing any bright red bleeding and the pain could be associated with RLP or a growth spurt.  Pt is going to come in to be seen by the provider to make sure there is no problems at this time.

## 2015-06-14 NOTE — Patient Instructions (Signed)

## 2015-06-14 NOTE — Progress Notes (Signed)
Pt experienced a vomiting episode at home and at the same time stated a brownish liquid discharge expelled, having some cramping as well.

## 2015-06-15 LAB — WET PREP, GENITAL
Trich, Wet Prep: NONE SEEN
Yeast Wet Prep HPF POC: NONE SEEN

## 2015-06-20 ENCOUNTER — Encounter: Payer: Self-pay | Admitting: Obstetrics and Gynecology

## 2015-06-29 ENCOUNTER — Ambulatory Visit (HOSPITAL_COMMUNITY)
Admission: RE | Admit: 2015-06-29 | Discharge: 2015-06-29 | Disposition: A | Discharge status: Home / Self Care | Payer: Medicaid Other | Source: Ambulatory Visit | Attending: Certified Nurse Midwife | Admitting: Certified Nurse Midwife

## 2015-06-29 ENCOUNTER — Other Ambulatory Visit: Payer: Self-pay | Admitting: Certified Nurse Midwife

## 2015-06-29 DIAGNOSIS — O09522 Supervision of elderly multigravida, second trimester: Secondary | ICD-10-CM | POA: Diagnosis not present

## 2015-06-29 DIAGNOSIS — Z36 Encounter for antenatal screening of mother: Secondary | ICD-10-CM | POA: Insufficient documentation

## 2015-06-29 DIAGNOSIS — Z3A18 18 weeks gestation of pregnancy: Secondary | ICD-10-CM | POA: Diagnosis not present

## 2015-06-29 DIAGNOSIS — Z3482 Encounter for supervision of other normal pregnancy, second trimester: Secondary | ICD-10-CM

## 2015-06-29 DIAGNOSIS — Z3689 Encounter for other specified antenatal screening: Secondary | ICD-10-CM

## 2015-07-04 ENCOUNTER — Telehealth: Payer: Self-pay | Admitting: *Deleted

## 2015-07-04 NOTE — Telephone Encounter (Signed)
Pt is currently [redacted] wks pregnant c/o abdominal discomfort on the right side of her abdomen.  Denies any vaginal bleeding or leaking. Informed pt that pain could be related to RLP or discomfort due to small uterine fibroid noted on [place] that was performed a few days ago.  Instructed to take Tylenol and drink some fluids to properly hydrate.  If pain continues to worsen or she experiences any vaginal bleeding to call the office or go to [facility] for evaluation.

## 2015-07-11 ENCOUNTER — Ambulatory Visit (INDEPENDENT_AMBULATORY_CARE_PROVIDER_SITE_OTHER): Payer: Medicaid Other | Admitting: Obstetrics & Gynecology

## 2015-07-11 VITALS — BP 110/58 | HR 81 | Wt 139.0 lb

## 2015-07-11 DIAGNOSIS — Z3482 Encounter for supervision of other normal pregnancy, second trimester: Secondary | ICD-10-CM

## 2015-07-11 NOTE — Patient Instructions (Signed)
Return to clinic for any scheduled appointments or obstetric concerns, or go to [facility] for evaluation  

## 2015-07-11 NOTE — Progress Notes (Signed)
Subjective:  [name] [name] is a 35 y.o. G3P0020 at [redacted]w[redacted]d being seen today for ongoing prenatal care.  She is currently monitored for the following issues for this low-risk pregnancy and has Supervision of normal pregnancy, antepartum; GBS bacteriuria; Hematuria of undiagnosed cause; and Migraine headache on her problem list.  Patient reports feeling wet. Denies leaking of fluid.   Contractions: Not present. Vag. Bleeding: None.  Movement: Absent.  Interested in Microbiologist.  The following portions of the patient's history were reviewed and updated as appropriate: allergies, current medications, past family history, past medical history, past social history, past surgical history and problem list. Problem list updated.  Objective:   Filed Vitals:   07/11/15 1438  BP: 110/58  Pulse: 81  Weight: 139 lb (63.05 kg)    Fetal Status: Fetal Heart Rate (bpm): 155 Fundal Height: 20 cm Movement: Absent     General:  Alert, oriented and cooperative. Patient is in no acute distress.  Skin: Skin is warm and dry. No rash noted.   Cardiovascular: Normal heart rate noted  Respiratory: Normal respiratory effort, no problems with respiration noted  Abdomen: Soft, gravid, appropriate for gestational age. Pain/Pressure: Absent     Pelvic: Vag. Bleeding: None Vag D/C Character: Thin Nitrazine negative Cervical exam deferred        Extremities: Normal range of motion.  Edema: None  Mental Status: Normal mood and affect. Normal behavior. Normal judgment and thought content.   Urinalysis: Urine Protein: Negative Urine Glucose: Negative  Assessment and Plan:  Pregnancy: G3P0020 at [redacted]w[redacted]d  1. Supervision of normal pregnancy, antepartum, second trimester Normal anatomy scan. Reassured about lack of ROM, likely discharge. Preterm labor symptoms and general obstetric precautions including but not limited to vaginal bleeding, contractions, leaking of fluid and fetal movement were reviewed in detail with the  patient. Please refer to After Visit Summary for other counseling recommendations.  Return in about 4 weeks (around 08/08/2015) for OB Visit.  Will meet with CNM to discuss waterbirth.    Osborne Oman, MD

## 2015-07-17 ENCOUNTER — Encounter: Payer: Self-pay | Admitting: *Deleted

## 2015-07-20 ENCOUNTER — Telehealth: Payer: Self-pay | Admitting: *Deleted

## 2015-07-20 NOTE — Telephone Encounter (Signed)
Pt c/o of sore throat and small red bumps on top of mouth and back of tongue similar to a rash.  Pt states she can eat and drink with no difficulty and denies any fever at this time.  Instructed to take Tylenol and do salt water gargles multiple times throughout the day.  Pt instructed to call back if she starts to run a fever or has any other symptoms arise.

## 2015-07-21 NOTE — Telephone Encounter (Signed)
Any exposure to any sick contacts? Rash on anywhere else on her body?  If symptoms persist; she can come in to office or ER for reevaluation.

## 2015-08-10 ENCOUNTER — Encounter: Payer: Medicaid Other | Admitting: Certified Nurse Midwife

## 2015-08-17 ENCOUNTER — Ambulatory Visit (INDEPENDENT_AMBULATORY_CARE_PROVIDER_SITE_OTHER): Payer: Medicaid Other | Admitting: Advanced Practice Midwife

## 2015-08-17 VITALS — BP 109/66 | HR 78 | Wt 144.0 lb

## 2015-08-17 DIAGNOSIS — Z3482 Encounter for supervision of other normal pregnancy, second trimester: Secondary | ICD-10-CM

## 2015-08-17 DIAGNOSIS — K029 Dental caries, unspecified: Secondary | ICD-10-CM

## 2015-08-17 NOTE — Patient Instructions (Addendum)
Considering Waterbirth? Guide for patients at [facility]  Why consider waterbirth?  . Gentle birth for babies . Less pain medicine used in labor . May allow for passive descent/less pushing . May reduce perineal tears  . More mobility and instinctive maternal position changes . Increased maternal relaxation . Reduced blood pressure in labor  Is waterbirth safe? What are the risks of infection, drowning or other complications?  . Infection: o Very low risk (3.7 % for tub vs 4.8% for bed) o 7 in 8000 waterbirths with documented infection o Poorly cleaned equipment most common cause o Slightly lower group B strep transmission rate  . Drowning o Maternal:  - Very low risk   - Related to seizures or fainting o Newborn:  - Very low risk. No evidence of increased risk of respiratory problems in multiple large studies - Physiological protection from breathing under water - Avoid underwater birth if there are any fetal complications - Once baby's head is out of the water, keep it out.  . Birth complication o Some reports of cord trauma, but risk decreased by bringing baby to surface gradually o No evidence of increased risk of shoulder dystocia. Mothers can usually change positions faster in water than in a bed, possibly aiding the maneuvers to free the shoulder.   Am I a candidate for waterbirth?  Yes, if you are: . Full-term (37 weeks or greater)  . Have had an uncomplicated pregnancy and labor  No, if you have: Marland Kitchen Preterm birth less than 37 weeks . Thick, particulate meconium stained fluid . Maternal fever over 101 . Heavy bleeding or signs of placental abruption . Pre-eclampsia  . Any abnormal fetal heart rate pattern . Breech presentation . Twins  . Very large baby . Active communicable infection (this does NOT include group B strep) . Significant limitation to mobility  Please remember that birth is unpredictable. Under certain unforeseeable  circumstances your provider may advise against giving birth in the tub. These decisions will be made on a case-by-case basis and with the safety of you and your baby as our highest priority.  Requirements for patients planning waterbirth  . Ask your midwife if you will be a candidate for waterbirth. . Attend the Barney Drain at [employer] at [phone] or [phone] for dates and times. The class is free and we strongly encourage you to bring your support person. You will receive a certificate of participation to show to your midwife or doctor. . Supplies needed for [facility] and [facility] patients: o Single-use disposable tub liner (birthpoolinabox.com  REGULAR size) o New garden hose labeled "lead-free", "suitable for drinking water", "non-toxic" OR "water potable" o Garden hose to remove the dirty water o Faucet adaptor to attach hose to faucet         o Electric drain pump to remove water (We recommend 792 gallon per hour or greater pump.)  o Fish net o Bathing suit top (optional) o Long-handled mirror (optional)  [employer] sells tubs for $120 if you would rather purchase your own tub   Thinking About Waterbirth???  You must attend a Doren Custard class at [facility]  3rd Wednesday of every month from 7-9pm  [employer] by calling [phone] or online at [email or web address]  Bring [place] the certificate from the class  Waterbirth supplies needed for [facility] patients:  Our practice has a Heritage manager in a Box tub at the hospital that you can  borrow  You will need to purchase an accessory kit that has all needed supplies through [facility] [phone]) or online $175.00  Or you can purchase the supplies separately: o Single-use disposable tub liner for Birth Pool in a Box (REGULAR size) o New garden hose labeled "lead-free",  "suitable for drinking water", o Electric drain pump to remove water (We recommend 792 gallon per hour or greater pump.)  o  "non-toxic" OR "water potable" o Garden hose to remove the dirty water o Fish net o Bathing suit top (optional) o Long-handled mirror (optional)  [employer] sells tubs for ~ $120 if you would rather purchase your own tub.  They also sell accessories, liners.    Www.waterbirthsolutions.com for tub purchases and supplies  The Labor Ladies (www.thelaborladies.com) $275 for tub rental/set-up & take down/kit   [employer] information regarding doulas (labor support) who provide pool rentals:  IdentityList.se.htm   The Labor Ladies (www.thelaborladies.com)  IdentityList.se.htm   Cline Cools, Doula ([facility]) [phone]  Things that would prevent you from having a waterbirth:  Premature, <37wks  Previous cesarean birth  Presence of thick meconium-stained fluid  Multiple gestation (Twins, triplets, etc.)  Uncontrolled diabetes or gestational diabetes requiring medication  Hypertension  Heavy vaginal bleeding  Non-reassuring fetal heart rate  Active infection (MRSA, etc.)  If your labor has to be induced and induction method requires continuous monitoring of the baby's heart rate  Other risks/issues identified by your obstetrical provider       Tdap Vaccine (Tetanus, Diphtheria and Pertussis): What You Need to Know 1. Why get vaccinated? Tetanus, diphtheria and pertussis are very serious diseases. Tdap vaccine can protect [place] from these diseases. And, Tdap vaccine given to pregnant women can protect newborn babies against pertussis. TETANUS (Lockjaw) is rare in the [place] today. It causes painful muscle tightening and stiffness, usually all over the body.  It can lead to tightening of muscles in the head and neck so you can't open your mouth, swallow, or sometimes even  breathe. Tetanus kills about 1 out of 10 people who are infected even after receiving the best medical care. DIPHTHERIA is also rare in the [place] today. It can cause a thick coating to form in the back of the throat.  It can lead to breathing problems, heart failure, paralysis, and death. PERTUSSIS (Whooping Cough) causes severe coughing spells, which can cause difficulty breathing, vomiting and disturbed sleep.  It can also lead to weight loss, incontinence, and rib fractures. Up to 2 in 100 adolescents and 5 in 100 adults with pertussis are hospitalized or have complications, which could include pneumonia or death. These diseases are caused by bacteria. Diphtheria and pertussis are spread from person to person through secretions from coughing or sneezing. Tetanus enters the body through cuts, scratches, or wounds. Before vaccines, as many as 200,000 cases of diphtheria, 200,000 cases of pertussis, and hundreds of cases of tetanus, were reported in the [place] each year. Since vaccination began, reports of cases for tetanus and diphtheria have dropped by about 99% and for pertussis by about 80%. 2. Tdap vaccine Tdap vaccine can protect adolescents and adults from tetanus, diphtheria, and pertussis. One dose of Tdap is routinely given at age 8 or 77. People who did not get Tdap at that age should get it as soon as possible. Tdap is especially important for healthcare professionals and anyone having close contact with a baby younger than 12 months. Pregnant women should get a  dose of Tdap during every pregnancy, to protect the newborn from pertussis. Infants are most at risk for severe, life-threatening complications from pertussis. Another vaccine, called Td, protects against tetanus and diphtheria, but not pertussis. A Td booster should be given every 10 years. Tdap may be given as one of these boosters if you have never gotten Tdap before. Tdap may also be given after a severe cut or  burn to prevent tetanus infection. Your doctor or the person giving you the vaccine can give you more information. Tdap may safely be given at the same time as other vaccines. 3. Some people should not get this vaccine  A person who has ever had a life-threatening allergic reaction after a previous dose of any diphtheria, tetanus or pertussis containing vaccine, OR has a severe allergy to any part of this vaccine, should not get Tdap vaccine. Tell the person giving the vaccine about any severe allergies.  Anyone who had coma or long repeated seizures within 7 days after a childhood dose of DTP or DTaP, or a previous dose of Tdap, should not get Tdap, unless a cause other than the vaccine was found. They can still get Td.  Talk to your doctor if you:  have seizures or another nervous system problem,  had severe pain or swelling after any vaccine containing diphtheria, tetanus or pertussis,  ever had a condition called Guillain-Barr Syndrome (GBS),  aren't feeling well on the day the shot is scheduled. 4. Risks With any medicine, including vaccines, there is a chance of side effects. These are usually mild and go away on their own. Serious reactions are also possible but are rare. Most people who get Tdap vaccine do not have any problems with it. Mild problems following Tdap (Did not interfere with activities)  Pain where the shot was given (about 3 in 4 adolescents or 2 in 3 adults)  Redness or swelling where the shot was given (about 1 person in 5)  Mild fever of at least 100.24F (up to about 1 in 25 adolescents or 1 in 100 adults)  Headache (about 3 or 4 people in 10)  Tiredness (about 1 person in 3 or 4)  Nausea, vomiting, diarrhea, stomach ache (up to 1 in 4 adolescents or 1 in 10 adults)  Chills, sore joints (about 1 person in 10)  Body aches (about 1 person in 3 or 4)  Rash, swollen glands (uncommon) Moderate problems following Tdap (Interfered with activities, but did  not require medical attention)  Pain where the shot was given (up to 1 in 5 or 6)  Redness or swelling where the shot was given (up to about 1 in 16 adolescents or 1 in 12 adults)  Fever over 102F (about 1 in 100 adolescents or 1 in 250 adults)  Headache (about 1 in 7 adolescents or 1 in 10 adults)  Nausea, vomiting, diarrhea, stomach ache (up to 1 or 3 people in 100)  Swelling of the entire arm where the shot was given (up to about 1 in 500). Severe problems following Tdap (Unable to perform usual activities; required medical attention)  Swelling, severe pain, bleeding and redness in the arm where the shot was given (rare). Problems that could happen after any vaccine:  People sometimes faint after a medical procedure, including vaccination. Sitting or lying down for about 15 minutes can help prevent fainting, and injuries caused by a fall. Tell your doctor if you feel dizzy, or have vision changes or ringing in the ears.  Some people get severe pain in the shoulder and have difficulty moving the arm where a shot was given. This happens very rarely.  Any medication can cause a severe allergic reaction. Such reactions from a vaccine are very rare, estimated at fewer than 1 in a million doses, and would happen within a few minutes to a few hours after the vaccination. As with any medicine, there is a very remote chance of a vaccine causing a serious injury or death. The safety of vaccines is always being monitored. For more information, visit: http://www.aguilar.org/ 5. What if there is a serious problem? What should I look for?  Look for anything that concerns you, such as signs of a severe allergic reaction, very high fever, or unusual behavior.  Signs of a severe allergic reaction can include hives, swelling of the face and throat, difficulty breathing, a fast heartbeat, dizziness, and weakness. These would usually start a few minutes to a few hours after the vaccination. What  should I do?  If you think it is a severe allergic reaction or other emergency that can't wait, call 9-1-1 or get the person to the nearest hospital. Otherwise, call your doctor.  Afterward, the reaction should be reported to the Vaccine Adverse Event Reporting System (VAERS). Your doctor might file this report, or you can do it yourself through the VAERS web site at www.vaers.SamedayNews.es, or by calling 223-082-0232. VAERS does not give medical advice.  6. The National Vaccine Injury Compensation Program The [employer] Vaccine Injury Compensation Program (VICP) is a federal program that was created to compensate people who may have been injured by certain vaccines. Persons who believe they may have been injured by a vaccine can learn about the program and about filing a claim by calling [phone] or visiting the [place] website at GoldCloset.com.ee. There is a time limit to file a claim for compensation. 7. How can I learn more?  Ask your doctor. He or she can give you the vaccine package insert or suggest other sources of information.  Call your local or state health department.  Contact the Centers for Disease Control and Prevention (CDC):  Call 845-479-0097 (1-800-CDC-INFO) or  Visit CDC's website at http://hunter.com/ CDC Tdap Vaccine VIS (05/11/13)   This information is not intended to replace advice given to you by your health care provider. Make sure you discuss any questions you have with your health care provider.   Document Released: 09/03/2011 Document Revised: 03/25/2014 Document Reviewed: 06/16/2013 Elsevier Interactive Patient Education [employer].

## 2015-08-17 NOTE — Progress Notes (Signed)
Subjective:  [name] [name] is a 35 y.o. G3P0020 at [redacted]w[redacted]d being seen today for ongoing prenatal care.  She is currently monitored for the following issues for this low-risk pregnancy and has Supervision of normal pregnancy, antepartum; GBS bacteriuria; Hematuria of undiagnosed cause; and Migraine headache on her problem list.  Patient reports no complaints.  Contractions: Not present. Vag. Bleeding: None.  Movement: Present. Denies leaking of fluid.   The following portions of the patient's history were reviewed and updated as appropriate: allergies, current medications, past family history, past medical history, past social history, past surgical history and problem list. Problem list updated.  Objective:   Filed Vitals:   08/17/15 1427  BP: 109/66  Pulse: 78  Weight: 144 lb (65.318 kg)    Fetal Status: Fetal Heart Rate (bpm): 156   Movement: Present     General:  Alert, oriented and cooperative. Patient is in no acute distress.  Skin: Skin is warm and dry. No rash noted.   Cardiovascular: Normal heart rate noted  Respiratory: Normal respiratory effort, no problems with respiration noted  Abdomen: Soft, gravid, appropriate for gestational age. Pain/Pressure: Absent     Pelvic: Vag. Bleeding: None Vag D/C Character: Thin   Cervical exam deferred        Extremities: Normal range of motion.  Edema: None  Mental Status: Normal mood and affect. Normal behavior. Normal judgment and thought content.   Urinalysis: Urine Protein: Trace Urine Glucose: Negative  Assessment and Plan:  Pregnancy: G3P0020 at [redacted]w[redacted]d  1. Supervision of normal pregnancy, antepartum, second trimester   Preterm labor symptoms and general obstetric precautions including but not limited to vaginal bleeding, contractions, leaking of fluid and fetal movement were reviewed in detail with the patient. Please refer to After Visit Summary for other counseling recommendations.  Return in about 3 weeks (around 09/07/2015) for  ROB/GTT.   Manya Silvas, CNM

## 2015-08-17 NOTE — Addendum Note (Signed)
Addended by: Ricka Burdock on: 08/17/2015 03:37 PM   Modules accepted: Orders

## 2015-08-18 ENCOUNTER — Encounter: Payer: Medicaid Other | Admitting: Obstetrics and Gynecology

## 2015-09-05 ENCOUNTER — Ambulatory Visit (INDEPENDENT_AMBULATORY_CARE_PROVIDER_SITE_OTHER): Payer: Medicaid Other | Admitting: Obstetrics and Gynecology

## 2015-09-05 VITALS — BP 110/68 | HR 65 | Wt 144.0 lb

## 2015-09-05 DIAGNOSIS — Z3482 Encounter for supervision of other normal pregnancy, second trimester: Secondary | ICD-10-CM

## 2015-09-05 DIAGNOSIS — Z36 Encounter for antenatal screening of mother: Secondary | ICD-10-CM | POA: Diagnosis not present

## 2015-09-05 NOTE — Progress Notes (Signed)
Subjective:  [name] [name] is a 35 y.o. G3P0020 at [redacted]w[redacted]d being seen today for ongoing prenatal care.  She is currently monitored for the following issues for this low-risk pregnancy and has Supervision of normal pregnancy, antepartum; GBS bacteriuria; Hematuria of undiagnosed cause; and Migraine headache on her problem list.  Patient reports no complaints.  Contractions: Not present. Vag. Bleeding: None.  Movement: Present. Denies leaking of fluid.   The following portions of the patient's history were reviewed and updated as appropriate: allergies, current medications, past family history, past medical history, past social history, past surgical history and problem list. Problem list updated.  Objective:   Filed Vitals:   09/05/15 1040  BP: 110/68  Pulse: 65  Weight: 144 lb (65.318 kg)    Fetal Status: Fetal Heart Rate (bpm): 140 Fundal Height: 28 cm Movement: Present     General:  Alert, oriented and cooperative. Patient is in no acute distress.  Skin: Skin is warm and dry. No rash noted.   Cardiovascular: Normal heart rate noted  Respiratory: Normal respiratory effort, no problems with respiration noted  Abdomen: Soft, gravid, appropriate for gestational age. Pain/Pressure: Present     Pelvic: Cervical exam deferred        Extremities: Normal range of motion.  Edema: Trace  Mental Status: Normal mood and affect. Normal behavior. Normal judgment and thought content.   Urinalysis: Urine Protein: Trace Urine Glucose: Negative  Assessment and Plan:  Pregnancy: G3P0020 at [redacted]w[redacted]d  1. Supervision of normal pregnancy, antepartum, second trimester Third trimester labs today - CBC - HIV antibody - Glucose Tolerance, 1 HR (50g) - RPR - Tdap vaccine greater than or equal to 7yo IM- undecided  Preterm labor symptoms and general obstetric precautions including but not limited to vaginal bleeding, contractions, leaking of fluid and fetal movement were reviewed in detail with the  patient. Please refer to After Visit Summary for other counseling recommendations.  No Follow-up on file.   Mora Bellman, MD

## 2015-09-06 LAB — GLUCOSE TOLERANCE, 1 HOUR (50G) W/O FASTING: Glucose, 1 Hr, gestational: 95 mg/dL (ref <140)

## 2015-09-06 LAB — CBC
HEMATOCRIT: 31.5 % — AB (ref 35.0–45.0)
Hemoglobin: 10.4 g/dL — ABNORMAL LOW (ref 11.7–15.5)
MCH: 30.3 pg (ref 27.0–33.0)
MCHC: 33 g/dL (ref 32.0–36.0)
MCV: 91.8 fL (ref 80.0–100.0)
MPV: 10.1 fL (ref 7.5–12.5)
Platelets: 196 10*3/uL (ref 140–400)
RBC: 3.43 MIL/uL — ABNORMAL LOW (ref 3.80–5.10)
RDW: 14.2 % (ref 11.0–15.0)
WBC: 5.6 10*3/uL (ref 3.8–10.8)

## 2015-09-06 LAB — RPR

## 2015-09-06 LAB — HIV ANTIBODY (ROUTINE TESTING W REFLEX): HIV 1&2 Ab, 4th Generation: NONREACTIVE

## 2015-09-22 ENCOUNTER — Encounter (HOSPITAL_COMMUNITY): Payer: Self-pay | Admitting: *Deleted

## 2015-09-22 ENCOUNTER — Ambulatory Visit (INDEPENDENT_AMBULATORY_CARE_PROVIDER_SITE_OTHER): Payer: Medicaid Other | Admitting: Obstetrics & Gynecology

## 2015-09-22 ENCOUNTER — Inpatient Hospital Stay (HOSPITAL_COMMUNITY)
Admission: AD | Admit: 2015-09-22 | Discharge: 2015-09-22 | Disposition: A | Discharge status: Home / Self Care | Payer: Medicaid Other | Source: Ambulatory Visit | Attending: Obstetrics and Gynecology | Admitting: Obstetrics and Gynecology

## 2015-09-22 ENCOUNTER — Inpatient Hospital Stay (HOSPITAL_COMMUNITY): Payer: Medicaid Other

## 2015-09-22 VITALS — BP 120/80 | HR 62 | Wt 143.0 lb

## 2015-09-22 DIAGNOSIS — R8271 Bacteriuria: Secondary | ICD-10-CM

## 2015-09-22 DIAGNOSIS — O4703 False labor before 37 completed weeks of gestation, third trimester: Secondary | ICD-10-CM

## 2015-09-22 DIAGNOSIS — Z3A30 30 weeks gestation of pregnancy: Secondary | ICD-10-CM

## 2015-09-22 DIAGNOSIS — D259 Leiomyoma of uterus, unspecified: Secondary | ICD-10-CM

## 2015-09-22 DIAGNOSIS — O3413 Maternal care for benign tumor of corpus uteri, third trimester: Secondary | ICD-10-CM

## 2015-09-22 DIAGNOSIS — D219 Benign neoplasm of connective and other soft tissue, unspecified: Secondary | ICD-10-CM | POA: Insufficient documentation

## 2015-09-22 DIAGNOSIS — O479 False labor, unspecified: Secondary | ICD-10-CM

## 2015-09-22 DIAGNOSIS — N949 Unspecified condition associated with female genital organs and menstrual cycle: Secondary | ICD-10-CM

## 2015-09-22 DIAGNOSIS — R102 Pelvic and perineal pain: Secondary | ICD-10-CM

## 2015-09-22 DIAGNOSIS — Z3483 Encounter for supervision of other normal pregnancy, third trimester: Secondary | ICD-10-CM

## 2015-09-22 LAB — URINE MICROSCOPIC-ADD ON: RBC / HPF: NONE SEEN RBC/hpf (ref 0–5)

## 2015-09-22 LAB — URINALYSIS, ROUTINE W REFLEX MICROSCOPIC
Bilirubin Urine: NEGATIVE
GLUCOSE, UA: NEGATIVE mg/dL
Hgb urine dipstick: NEGATIVE
Ketones, ur: NEGATIVE mg/dL
Nitrite: NEGATIVE
PROTEIN: NEGATIVE mg/dL
Specific Gravity, Urine: 1.01 (ref 1.005–1.030)
pH: 6 (ref 5.0–8.0)

## 2015-09-22 LAB — FETAL FIBRONECTIN: Fetal Fibronectin: NEGATIVE

## 2015-09-22 MED ORDER — ACETAMINOPHEN 500 MG PO TABS: 1000.0000 mg | ORAL_TABLET | Freq: Four times a day (QID) | ORAL | Status: DC | PRN

## 2015-09-22 MED ORDER — OXYCODONE-ACETAMINOPHEN 5-325 MG PO TABS
1.0000 | ORAL_TABLET | Freq: Four times a day (QID) | ORAL | Status: DC | PRN
  Administered 2015-09-22: 1 via ORAL
  Filled 2015-09-22: qty 1

## 2015-09-22 NOTE — Discharge Instructions (Signed)
Abdominal Pain During Pregnancy Abdominal pain is common in pregnancy. Most of the time, it does not cause harm. There are many causes of abdominal pain. Some causes are more serious than others. Some of the causes of abdominal pain in pregnancy are easily diagnosed. Occasionally, the diagnosis takes time to understand. Other times, the cause is not determined. Abdominal pain can be a sign that something is very wrong with the pregnancy, or the pain may have nothing to do with the pregnancy at all. For this reason, always tell your health care provider if you have any abdominal discomfort. HOME CARE INSTRUCTIONS  Monitor your abdominal pain for any changes. The following actions may help to alleviate any discomfort you are experiencing:  Do not have sexual intercourse or put anything in your vagina until your symptoms go away completely.  Get plenty of rest until your pain improves.  Drink clear fluids if you feel nauseous. Avoid solid food as long as you are uncomfortable or nauseous.  Only take over-the-counter or prescription medicine as directed by your health care provider.  Keep all follow-up appointments with your health care provider. SEEK IMMEDIATE MEDICAL CARE IF:  You are bleeding, leaking fluid, or passing tissue from the vagina.  You have increasing pain or cramping.  You have persistent vomiting.  You have painful or bloody urination.  You have a fever.  You notice a decrease in your baby's movements.  You have extreme weakness or feel faint.  You have shortness of breath, with or without abdominal pain.  You develop a severe headache with abdominal pain.  You have abnormal vaginal discharge with abdominal pain.  You have persistent diarrhea.  You have abdominal pain that continues even after rest, or gets worse. MAKE SURE YOU:   Understand these instructions.  Will watch your condition.  Will get help right away if you are not doing well or get worse.     This information is not intended to replace advice given to you by your health care provider. Make sure you discuss any questions you have with your health care provider.   Document Released: 03/04/2005 Document Revised: 12/23/2012 Document Reviewed: 10/01/2012 Elsevier Interactive Patient Education 2016 Elsevier Inc.  [employer] of the uterus can occur throughout pregnancy. Contractions are not always a sign that you are in labor.  WHAT ARE BRAXTON HICKS CONTRACTIONS?  Contractions that occur before labor are called Braxton Hicks contractions, or false labor. Toward the end of pregnancy (32-34 weeks), these contractions can develop more often and may become more forceful. This is not true labor because these contractions do not result in opening (dilatation) and thinning of the cervix. They are sometimes difficult to tell apart from true labor because these contractions can be forceful and people have different pain tolerances. You should not feel embarrassed if you go to the hospital with false labor. Sometimes, the only way to tell if you are in true labor is for your health care provider to look for changes in the cervix. If there are no prenatal problems or other health problems associated with the pregnancy, it is completely safe to be sent home with false labor and await the onset of true labor. HOW CAN YOU TELL THE DIFFERENCE BETWEEN TRUE AND FALSE LABOR? False Labor  The contractions of false labor are usually shorter and not as hard as those of true labor.   The contractions are usually irregular.   The contractions are often felt in the front of  the lower abdomen and in the groin.   The contractions may go away when you walk around or change positions while lying down.   The contractions get weaker and are shorter lasting as time goes on.   The contractions do not usually become progressively stronger, regular, and closer together as with true  labor.  True Labor  Contractions in true labor last 30-70 seconds, become very regular, usually become more intense, and increase in frequency.   The contractions do not go away with walking.   The discomfort is usually felt in the top of the uterus and spreads to the lower abdomen and low back.   True labor can be determined by your health care provider with an exam. This will show that the cervix is dilating and getting thinner.  WHAT TO REMEMBER  Keep up with your usual exercises and follow other instructions given by your health care provider.   Take medicines as directed by your health care provider.   Keep your regular prenatal appointments.   Eat and drink lightly if you think you are going into labor.   If Braxton Hicks contractions are making you uncomfortable:   Change your position from lying down or resting to walking, or from walking to resting.   Sit and rest in a tub of warm water.   Drink 2-3 glasses of water. Dehydration may cause these contractions.   Do slow and deep breathing several times an hour.  WHEN SHOULD I SEEK IMMEDIATE MEDICAL CARE? Seek immediate medical care if:  Your contractions become stronger, more regular, and closer together.   You have fluid leaking or gushing from your vagina.   You have a fever.   You pass blood-tinged mucus.   You have vaginal bleeding.   You have continuous abdominal pain.   You have low back pain that you never had before.   You feel your baby's head pushing down and causing pelvic pressure.   Your baby is not moving as much as it used to.    This information is not intended to replace advice given to you by your health care provider. Make sure you discuss any questions you have with your health care provider.   Document Released: 03/04/2005 Document Revised: 03/09/2013 Document Reviewed: 12/14/2012 Elsevier Interactive Patient Education 2016 Elsevier Inc. Uterine  Fibroids Uterine fibroids are tissue masses (tumors) that can develop in the womb (uterus). They are also called leiomyomas. This type of tumor is not cancerous (benign) and does not spread to other parts of the body outside of the pelvic area, which is between the hip bones. Occasionally, fibroids may develop in the fallopian tubes, in the cervix, or on the support structures (ligaments) that surround the uterus. You can have one or many fibroids. Fibroids can vary in size, weight, and where they grow in the uterus. Some can become quite large. Most fibroids do not require medical treatment. CAUSES A fibroid can develop when a single uterine cell keeps growing (replicating). Most cells in the human body have a control mechanism that keeps them from replicating without control. SIGNS AND SYMPTOMS Symptoms may include:   Heavy bleeding during your period.  Bleeding or spotting between periods.  Pelvic pain and pressure.  Bladder problems, such as needing to urinate more often (urinary frequency) or urgently.  Inability to reproduce offspring (infertility).  Miscarriages. DIAGNOSIS Uterine fibroids are diagnosed through a physical exam. Your health care provider may feel the lumpy tumors during a pelvic exam. Ultrasonography  and an MRI may be done to determine the size, location, and number of fibroids. TREATMENT Treatment may include:  Watchful waiting. This involves getting the fibroid checked by your health care provider to see if it grows or shrinks. Follow your health care provider's recommendations for how often to have this checked.  Hormone medicines. These can be taken by mouth or given through an intrauterine device (IUD).  Surgery.  Removing the fibroids (myomectomy) or the uterus (hysterectomy).  Removing blood supply to the fibroids (uterine artery embolization). If fibroids interfere with your fertility and you want to become pregnant, your health care provider may  recommend having the fibroids removed.  HOME CARE INSTRUCTIONS  Keep all follow-up visits as directed by your health care provider. This is important.  Take medicines only as directed by your health care provider.  If you were prescribed a hormone treatment, take the hormone medicines exactly as directed.  Do not take aspirin, because it can cause bleeding.  Ask your health care provider about taking iron pills and increasing the amount of dark green, leafy vegetables in your diet. These actions can help to boost your blood iron levels, which may be affected by heavy menstrual bleeding.  Pay close attention to your period and tell your health care provider about any changes, such as:  Increased blood flow that requires you to use more pads or tampons than usual per month.  A change in the number of days that your period lasts per month.  A change in symptoms that are associated with your period, such as abdominal cramping or back pain. SEEK MEDICAL CARE IF:  You have pelvic pain, back pain, or abdominal cramps that cannot be controlled with medicines.  You have an increase in bleeding between and during periods.  You soak tampons or pads in a half hour or less.  You feel lightheaded, extra tired, or weak. SEEK IMMEDIATE MEDICAL CARE IF:  You faint.  You have a sudden increase in pelvic pain.   This information is not intended to replace advice given to you by your health care provider. Make sure you discuss any questions you have with your health care provider.   Document Released: 03/01/2000 Document Revised: 03/25/2014 Document Reviewed: 08/31/2013 Elsevier Interactive Patient Education [employer].

## 2015-09-22 NOTE — MAU Provider Note (Signed)
MAU HISTORY AND PHYSICAL  Chief Complaint:  Pelvic pain  [name] [name] is a 35 y.o.  [id number] with IUP at [redacted]w[redacted]d presenting for the above.  Began last night at 10 pm. crampy pain, sharp pain. Comes and goes. More on right side but throughout. Sometimes shoots to groin. Sometimes shoots back. No upper abdominal pain. No vaginal bleeding or lof. Positive fetal movement. No vomiting. No diarrhea. No fever. Nothing in vagina past 24 hours.   History reviewed. No pertinent past medical history.  Past Surgical History  Procedure Laterality Date  . Leg surgery Left 1999    steel rod in left femur bone     Family History  Problem Relation Age of Onset  . Hyperlipidemia Mother   . Heart disease Maternal Aunt   . Heart disease Maternal Uncle   . Heart disease Paternal Aunt   . Heart disease Maternal Grandfather   . Heart disease Paternal Grandfather     Social History  Substance Use Topics  . Smoking status: Never Smoker   . Smokeless tobacco: Never Used  . Alcohol Use: Yes     Comment: wine     Allergies  Allergen Reactions  . Bactrim [Sulfamethoxazole-Trimethoprim] Hives and Swelling  . Penicillins     fever    Prescriptions prior to admission  Medication Sig Dispense Refill Last Dose  . calcium carbonate (TUMS EX) 750 MG chewable tablet Chew 2 tablets by mouth 2 (two) times daily.     Marland Kitchen acetaminophen (TYLENOL) 500 MG tablet Take 1,000 mg by mouth every 8 (eight) hours as needed.   Taking  . butalbital-acetaminophen-caffeine (FIORICET/CODEINE) 50-325-40-30 MG capsule Take 1 capsule by mouth every 4 (four) hours as needed for headache. (Patient not taking: Reported on 07/11/2015) 30 capsule 0 Not Taking  . methylphenidate 36 MG PO CR tablet Take by mouth.     . Prenatal Vit-Fe Fumarate-FA (MULTIVITAMIN-PRENATAL) 27-0.8 MG TABS tablet Take 1 tablet by mouth daily at 12 noon.   Taking    Review of Systems - Negative except for what is mentioned in HPI.  Physical Exam  Blood  pressure 120/74, pulse 72, temperature 98.4 F (36.9 C), temperature source Oral, resp. rate 18, height 5' 0.5" (1.537 m), weight 144 lb (65.318 kg), last menstrual period 02/19/2015, SpO2 100 %. GENERAL: Well-developed, well-nourished female in no acute distress.  LUNGS: Clear to auscultation bilaterally.  HEART: Regular rate and rhythm. ABDOMEN: Soft, nontender, tender mobile nodule over LUS, gravid.  EXTREMITIES: Nontender, no edema, 2+ distal pulses. Cervical Exam: closed/thick/high FHT:  140/mod/+a/-d Contractions: Every 3 min   Labs: No results found for this or any previous visit (from the past 24 hour(s)).  Imaging Studies:  No results found.  Assessment: [name] [name] is  35 y.o. G3P0020 at [redacted]w[redacted]d presents with pelvic pain. Shooting pain sounds like round ligament pain. Is contracting, but cervix closed and ffn negative - think braxton hicks. No bleeding or uterine tenderness or fetal heart rate abnormalities to suggest abruption. Limited u/s obtained to eval tender nodule over LUS - this appears to be a fibroid. NST reactive for gestational age.  Plan: - d/c home with abdominal pain in pregnancy return precautions and ob f/u as scheduled - push hydration - preterm labor and abruption return precautions  [name] [name] 7/7/20175:50 AM

## 2015-09-22 NOTE — Progress Notes (Signed)
Subjective:  [name] [name] is a 35 y.o. G3P0020 at [redacted]w[redacted]d being seen today for ongoing prenatal care.  She is currently monitored for the following issues for this low-risk pregnancy and has Supervision of normal pregnancy, antepartum; GBS bacteriuria; Hematuria of undiagnosed cause; Migraine headache; and Uterine fibroids affecting pregnancy in third trimester on her problem list.  Patient reports fibroid pain and frequent contractions for which she was seen in MAU last night. Her cervix was closed, FFN was negative, RNST. Limited OB scan showed normal fluid, cephalic presentation, 5 cm degenerating LUS fibroid..  Contractions: Irregular. Vag. Bleeding: None.  Movement: Present. Denies leaking of fluid.   The following portions of the patient's history were reviewed and updated as appropriate: allergies, current medications, past family history, past medical history, past social history, past surgical history and problem list. Problem list updated.  Objective:   Filed Vitals:   09/22/15 1034  BP: 120/80  Pulse: 62  Weight: 143 lb (64.864 kg)    Fetal Status: Fetal Heart Rate (bpm): 115 Fundal Height: 31 cm Movement: Present     General:  Alert, oriented and cooperative. Patient is in no acute distress.  Skin: Skin is warm and dry. No rash noted.   Cardiovascular: Normal heart rate noted  Respiratory: Normal respiratory effort, no problems with respiration noted  Abdomen: Soft, gravid, appropriate for gestational age. Pain/Pressure: Present     Pelvic:  Cervical exam deferred        Extremities: Normal range of motion.  Edema: Trace  Mental Status: Normal mood and affect. Normal behavior. Normal judgment and thought content.   Urinalysis: Urine Protein: Negative Urine Glucose: Negative  Assessment and Plan:  Pregnancy: G3P0020 at [redacted]w[redacted]d  1. Uterine fibroids affecting pregnancy in third trimester Patient urged to take Tylenol prn for now. If pain worsen, may consider Tylenol #3 or  Tramadol.  Will continue to observe. - acetaminophen (TYLENOL) 500 MG tablet; Take 2 tablets (1,000 mg total) by mouth every 6 (six) hours as needed for moderate pain.  Dispense: 60 tablet; Refill: 2  2. Supervision of normal pregnancy, antepartum, third trimester Preterm labor symptoms and general obstetric precautions including but not limited to vaginal bleeding, contractions, leaking of fluid and fetal movement were reviewed in detail with the patient. Please refer to After Visit Summary for other counseling recommendations.  Return in about 2 weeks (around 10/06/2015) for OB Visit.   Osborne Oman, MD

## 2015-09-22 NOTE — Patient Instructions (Signed)
Return to clinic for any scheduled appointments or obstetric concerns, or go to [facility] for evaluation  

## 2015-09-22 NOTE — MAU Note (Signed)
Pt reports she has been in pain abd and radiates down into her vagina. Denies bleeding.

## 2015-10-06 ENCOUNTER — Ambulatory Visit (INDEPENDENT_AMBULATORY_CARE_PROVIDER_SITE_OTHER): Payer: Medicaid Other | Admitting: Obstetrics and Gynecology

## 2015-10-06 VITALS — BP 117/77 | HR 74 | Wt 146.0 lb

## 2015-10-06 DIAGNOSIS — O3413 Maternal care for benign tumor of corpus uteri, third trimester: Secondary | ICD-10-CM

## 2015-10-06 DIAGNOSIS — O09513 Supervision of elderly primigravida, third trimester: Secondary | ICD-10-CM

## 2015-10-06 DIAGNOSIS — Z3483 Encounter for supervision of other normal pregnancy, third trimester: Secondary | ICD-10-CM

## 2015-10-06 DIAGNOSIS — D259 Leiomyoma of uterus, unspecified: Secondary | ICD-10-CM

## 2015-10-06 DIAGNOSIS — O09519 Supervision of elderly primigravida, unspecified trimester: Secondary | ICD-10-CM | POA: Insufficient documentation

## 2015-10-06 DIAGNOSIS — R8271 Bacteriuria: Secondary | ICD-10-CM

## 2015-10-06 NOTE — Progress Notes (Signed)
Prenatal Visit Note Date: 10/06/2015 Clinic: [facility]  Subjective:  [name] [name] is a 35 y.o. [phone] at [redacted]w[redacted]d being seen today for ongoing prenatal care.  She is currently monitored for the following issues for this low-risk pregnancy and has Supervision of normal pregnancy, antepartum; GBS bacteriuria; Hematuria of undiagnosed cause; Migraine headache; Uterine fibroids affecting pregnancy in third trimester; and AMA (advanced maternal age) primigravida 35+ on her problem list.  Patient reports no complaints.   Contractions: Irregular. Vag. Bleeding: None.  Movement: Present. Denies leaking of fluid.   The following portions of the patient's history were reviewed and updated as appropriate: allergies, current medications, past family history, past medical history, past social history, past surgical history and problem list. Problem list updated.  Objective:   Filed Vitals:   10/06/15 1055  BP: 117/77  Pulse: 74  Weight: 146 lb (66.225 kg)    Fetal Status: Fetal Heart Rate (bpm): 127   Movement: Present     General:  Alert, oriented and cooperative. Patient is in no acute distress.  Skin: Skin is warm and dry. No rash noted.   Cardiovascular: Normal heart rate noted  Respiratory: Normal respiratory effort, no problems with respiration noted  Abdomen: Soft, gravid, appropriate for gestational age. Pain/Pressure: Present     Pelvic:  Cervical exam deferred        Extremities: Normal range of motion.  Edema: Trace  Mental Status: Normal mood and affect. Normal behavior. Normal judgment and thought content.   Urinalysis: Urine Protein: Negative Urine Glucose: Negative  Assessment and Plan:  Pregnancy: G3P0020 at [redacted]w[redacted]d  1. AMA (advanced maternal age) primigravida 52+, third trimester -pt declined testing  2. Uterine fibroids affecting pregnancy in third trimester -5cm in the LUS. Appears anterior with placenta being posterior on 7/7 MAU  visit  3. Supervision of normal pregnancy, antepartum, third trimester Routine care. Doesn't want a BTL  4. GBS bacteriuria tx in labor  Preterm labor symptoms and general obstetric precautions including but not limited to vaginal bleeding, contractions, leaking of fluid and fetal movement were reviewed in detail with the patient. Please refer to After Visit Summary for other counseling recommendations.   RTC 2wks   Aletha Halim, MD

## 2015-10-18 ENCOUNTER — Ambulatory Visit (INDEPENDENT_AMBULATORY_CARE_PROVIDER_SITE_OTHER): Payer: Medicaid Other | Admitting: Obstetrics and Gynecology

## 2015-10-18 VITALS — BP 130/80 | HR 58 | Wt 144.0 lb

## 2015-10-18 DIAGNOSIS — R8271 Bacteriuria: Secondary | ICD-10-CM

## 2015-10-18 DIAGNOSIS — Z3483 Encounter for supervision of other normal pregnancy, third trimester: Secondary | ICD-10-CM

## 2015-10-18 NOTE — Progress Notes (Signed)
Prenatal Visit Note Date: 10/18/2015 Clinic: [facility]  Subjective:  [name] [name] is a 35 y.o. G3P0020 at [redacted]w[redacted]d being seen today for ongoing prenatal care.  She is currently monitored for the following issues for this low-risk pregnancy and has Supervision of normal pregnancy, antepartum; GBS bacteriuria; Hematuria of undiagnosed cause; Migraine headache; Uterine fibroids affecting pregnancy in third trimester; and AMA (advanced maternal age) primigravida 35+ on her problem list.  Patient reports no complaints.   Contractions: Irritability. Vag. Bleeding: None.  Movement: Present. Denies leaking of fluid.   The following portions of the patient's history were reviewed and updated as appropriate: allergies, current medications, past family history, past medical history, past social history, past surgical history and problem list. Problem list updated.  Objective:   Vitals:   10/18/15 1019  BP: 130/80  Pulse: (!) 58  Weight: 144 lb (65.3 kg)    Fetal Status: Fetal Heart Rate (bpm): 132   Movement: Present     General:  Alert, oriented and cooperative. Patient is in no acute distress.  Skin: Skin is warm and dry. No rash noted.   Cardiovascular: Normal heart rate noted  Respiratory: Normal respiratory effort, no problems with respiration noted  Abdomen: Soft, gravid, appropriate for gestational age. Pain/Pressure: Present     Pelvic:  Cervical exam deferred        Extremities: Normal range of motion.  Edema: Trace  Mental Status: Normal mood and affect. Normal behavior. Normal judgment and thought content.   Urinalysis:      Assessment and Plan:  Pregnancy: G3P0020 at [redacted]w[redacted]d  1. AMA (advanced maternal age) primigravida 14+, third trimester -pt declined testing  2. Uterine fibroids affecting pregnancy in third trimester -5cm in the LUS. Appears anterior with placenta being posterior on 7/7 MAU visit  3. Supervision of normal pregnancy,  antepartum, third trimester Routine care. Doesn't want a BTL  4. GBS bacteriuria tx in labor. Forgot that she had bacteruria so patient will need treatment. [facility] tell [facility] at [facility] that if RV swab is + that can do clinda testing. If negative, then may have to do vanc (PCN she states caused hives and she said that she had ?throat swelling with some PCN drug in the past)  Preterm labor symptoms and general obstetric precautions including but not limited to vaginal bleeding, contractions, leaking of fluid and fetal movement were reviewed in detail with the patient. Please refer to After Visit Summary for other counseling recommendations.  Return in about 2 weeks (around 11/01/2015).   Aletha Halim, MD

## 2015-11-01 ENCOUNTER — Other Ambulatory Visit (HOSPITAL_COMMUNITY)
Admission: RE | Admit: 2015-11-01 | Discharge: 2015-11-01 | Disposition: A | Discharge status: Home / Self Care | Payer: Medicaid Other | Source: Ambulatory Visit | Attending: Family Medicine | Admitting: Family Medicine

## 2015-11-01 ENCOUNTER — Ambulatory Visit (INDEPENDENT_AMBULATORY_CARE_PROVIDER_SITE_OTHER): Payer: Medicaid Other | Admitting: Family Medicine

## 2015-11-01 VITALS — BP 121/69 | HR 54 | Wt 145.0 lb

## 2015-11-01 DIAGNOSIS — Z113 Encounter for screening for infections with a predominantly sexual mode of transmission: Secondary | ICD-10-CM

## 2015-11-01 DIAGNOSIS — Z3483 Encounter for supervision of other normal pregnancy, third trimester: Secondary | ICD-10-CM

## 2015-11-01 LAB — OB RESULTS CONSOLE GBS: STREP GROUP B AG: POSITIVE

## 2015-11-01 LAB — OB RESULTS CONSOLE GC/CHLAMYDIA: Gonorrhea: NEGATIVE

## 2015-11-01 NOTE — Progress Notes (Signed)
Subjective:  [name] [name] is a 35 y.o. G3P0020 at [redacted]w[redacted]d being seen today for ongoing prenatal care.  She is currently monitored for the following issues for this low-risk pregnancy and has Supervision of normal pregnancy, antepartum; GBS bacteriuria; Hematuria of undiagnosed cause; Migraine headache; Uterine fibroids affecting pregnancy in third trimester; and AMA (advanced maternal age) primigravida 35+ on her problem list.  Patient reports no complaints.  Contractions: Irritability. Vag. Bleeding: None.  Movement: Present. Denies leaking of fluid.   The following portions of the patient's history were reviewed and updated as appropriate: allergies, current medications, past family history, past medical history, past social history, past surgical history and problem list. Problem list updated.  Objective:   Vitals:   11/01/15 1118  BP: 121/69  Pulse: (!) 54  Weight: 145 lb (65.8 kg)    Fetal Status: Fetal Heart Rate (bpm): 128 Fundal Height: 34 cm Movement: Present  Presentation: Vertex  General:  Alert, oriented and cooperative. Patient is in no acute distress.  Skin: Skin is warm and dry. No rash noted.   Cardiovascular: Normal heart rate noted  Respiratory: Normal respiratory effort, no problems with respiration noted  Abdomen: Soft, gravid, appropriate for gestational age. Pain/Pressure: Present     Pelvic:  Cervical exam performed Dilation: Closed Effacement (%): Thick Station: -3  Extremities: Normal range of motion.  Edema: None  Mental Status: Normal mood and affect. Normal behavior. Normal judgment and thought content.   Urinalysis: Urine Protein: Negative Urine Glucose: Negative  Assessment and Plan:  Pregnancy: G3P0020 at [redacted]w[redacted]d  1. Supervision of normal pregnancy, antepartum, third trimester  - GC/Chlamydia probe amp ([place])not at [facility] - Culture, Grp B Strep w/Rflx Suscept  Term labor symptoms and general obstetric precautions including but not limited to  vaginal bleeding, contractions, leaking of fluid and fetal movement were reviewed in detail with the patient. Please refer to After Visit Summary for other counseling recommendations.  Return in 1 week (on 11/08/2015).   Donnamae Jude, MD

## 2015-11-01 NOTE — Patient Instructions (Signed)
Third Trimester of Pregnancy The third trimester is from week 29 through week 42, months 7 through 9. The third trimester is a time when the fetus is growing rapidly. At the end of the ninth month, the fetus is about 20 inches in length and weighs 6-10 pounds.  BODY CHANGES Your body goes through many changes during pregnancy. The changes vary from woman to woman.   Your weight will continue to increase. You can expect to gain 25-35 pounds (11-16 kg) by the end of the pregnancy.  You may begin to get stretch marks on your hips, abdomen, and breasts.  You may urinate more often because the fetus is moving lower into your pelvis and pressing on your bladder.  You may develop or continue to have heartburn as a result of your pregnancy.  You may develop constipation because certain hormones are causing the muscles that push waste through your intestines to slow down.  You may develop hemorrhoids or swollen, bulging veins (varicose veins).  You may have pelvic pain because of the weight gain and pregnancy hormones relaxing your joints between the bones in your pelvis. Backaches may result from overexertion of the muscles supporting your posture.  You may have changes in your hair. These can include thickening of your hair, rapid growth, and changes in texture. Some women also have hair loss during or after pregnancy, or hair that feels dry or thin. Your hair will most likely return to normal after your baby is born.  Your breasts will continue to grow and be tender. A yellow discharge may leak from your breasts called colostrum.  Your belly button may stick out.  You may feel short of breath because of your expanding uterus.  You may notice the fetus "dropping," or moving lower in your abdomen.  You may have a bloody mucus discharge. This usually occurs a few days to a week before labor begins.  Your cervix becomes thin and soft (effaced) near your due date. WHAT TO EXPECT AT YOUR  PRENATAL EXAMS  You will have prenatal exams every 2 weeks until week 36. Then, you will have weekly prenatal exams. During a routine prenatal visit:  You will be weighed to make sure you and the fetus are growing normally.  Your blood pressure is taken.  Your abdomen will be measured to track your baby's growth.  The fetal heartbeat will be listened to.  Any test results from the previous visit will be discussed.  You may have a cervical check near your due date to see if you have effaced. At around 36 weeks, your caregiver will check your cervix. At the same time, your caregiver will also perform a test on the secretions of the vaginal tissue. This test is to determine if a type of bacteria, Group B streptococcus, is present. Your caregiver will explain this further. Your caregiver may ask you:  What your birth plan is.  How you are feeling.  If you are feeling the baby move.  If you have had any abnormal symptoms, such as leaking fluid, bleeding, severe headaches, or abdominal cramping.  If you are using any tobacco products, including cigarettes, chewing tobacco, and electronic cigarettes.  If you have any questions. Other tests or screenings that may be performed during your third trimester include:  Blood tests that check for low iron levels (anemia).  Fetal testing to check the health, activity level, and growth of the fetus. Testing is done if you have certain medical conditions or if   there are problems during the pregnancy.  HIV (human immunodeficiency virus) testing. If you are at high risk, you may be screened for HIV during your third trimester of pregnancy. FALSE LABOR You may feel small, irregular contractions that eventually go away. These are called Braxton Hicks contractions, or false labor. Contractions may last for hours, days, or even weeks before true labor sets in. If contractions come at regular intervals, intensify, or become painful, it is best to be seen  by your caregiver.  SIGNS OF LABOR   Menstrual-like cramps.  Contractions that are 5 minutes apart or less.  Contractions that start on the top of the uterus and spread down to the lower abdomen and back.  A sense of increased pelvic pressure or back pain.  A watery or bloody mucus discharge that comes from the vagina. If you have any of these signs before the 37th week of pregnancy, call your caregiver right away. You need to go to the hospital to get checked immediately. HOME CARE INSTRUCTIONS   Avoid all smoking, herbs, alcohol, and unprescribed drugs. These chemicals affect the formation and growth of the baby.  Do not use any tobacco products, including cigarettes, chewing tobacco, and electronic cigarettes. If you need help quitting, ask your health care provider. You may receive counseling support and other resources to help you quit.  Follow your caregiver's instructions regarding medicine use. There are medicines that are either safe or unsafe to take during pregnancy.  Exercise only as directed by your caregiver. Experiencing uterine cramps is a good sign to stop exercising.  Continue to eat regular, healthy meals.  Wear a good support bra for breast tenderness.  Do not use hot tubs, steam rooms, or saunas.  Wear your seat belt at all times when driving.  Avoid raw meat, uncooked cheese, cat litter boxes, and soil used by cats. These carry germs that can cause birth defects in the baby.  Take your prenatal vitamins.  Take 1500-2000 mg of calcium daily starting at the 20th week of pregnancy until you deliver your baby.  Try taking a stool softener (if your caregiver approves) if you develop constipation. Eat more high-fiber foods, such as fresh vegetables or fruit and whole grains. Drink plenty of fluids to keep your urine clear or pale yellow.  Take warm sitz baths to soothe any pain or discomfort caused by hemorrhoids. Use hemorrhoid cream if your caregiver  approves.  If you develop varicose veins, wear support hose. Elevate your feet for 15 minutes, 3-4 times a day. Limit salt in your diet.  Avoid heavy lifting, wear low heal shoes, and practice good posture.  Rest a lot with your legs elevated if you have leg cramps or low back pain.  Visit your dentist if you have not gone during your pregnancy. Use a soft toothbrush to brush your teeth and be gentle when you floss.  A sexual relationship may be continued unless your caregiver directs you otherwise.  Do not travel far distances unless it is absolutely necessary and only with the approval of your caregiver.  Take prenatal classes to understand, practice, and ask questions about the labor and delivery.  Make a trial run to the hospital.  Pack your hospital bag.  Prepare the baby's nursery.  Continue to go to all your prenatal visits as directed by your caregiver. SEEK MEDICAL CARE IF:  You are unsure if you are in labor or if your water has broken.  You have dizziness.  You have   mild pelvic cramps, pelvic pressure, or nagging pain in your abdominal area.  You have persistent nausea, vomiting, or diarrhea.  You have a bad smelling vaginal discharge.  You have pain with urination. SEEK IMMEDIATE MEDICAL CARE IF:   You have a fever.  You are leaking fluid from your vagina.  You have spotting or bleeding from your vagina.  You have severe abdominal cramping or pain.  You have rapid weight loss or gain.  You have shortness of breath with chest pain.  You notice sudden or extreme swelling of your face, hands, ankles, feet, or legs.  You have not felt your baby move in over an hour.  You have severe headaches that do not go away with medicine.  You have vision changes.   This information is not intended to replace advice given to you by your health care provider. Make sure you discuss any questions you have with your health care provider.   Document Released:  02/26/2001 Document Revised: 03/25/2014 Document Reviewed: 05/05/2012 Elsevier Interactive Patient Education 2016 [employer]  Breastfeeding Deciding to breastfeed is one of the best choices you can make for you and your baby. A change in hormones during pregnancy causes your breast tissue to grow and increases the number and size of your milk ducts. These hormones also allow proteins, sugars, and fats from your blood supply to make breast milk in your milk-producing glands. Hormones prevent breast milk from being released before your baby is born as well as prompt milk flow after birth. Once breastfeeding has begun, thoughts of your baby, as well as his or her sucking or crying, can stimulate the release of milk from your milk-producing glands.  BENEFITS OF BREASTFEEDING For Your Baby  Your first milk (colostrum) helps your baby's digestive system function better.  There are antibodies in your milk that help your baby fight off infections.  Your baby has a lower incidence of asthma, allergies, and sudden infant death syndrome.  The nutrients in breast milk are better for your baby than infant formulas and are designed uniquely for your baby's needs.  Breast milk improves your baby's brain development.  Your baby is less likely to develop other conditions, such as childhood obesity, asthma, or type 2 diabetes mellitus. For You  Breastfeeding helps to create a very special bond between you and your baby.  Breastfeeding is convenient. Breast milk is always available at the correct temperature and costs nothing.  Breastfeeding helps to burn calories and helps you lose the weight gained during pregnancy.  Breastfeeding makes your uterus contract to its prepregnancy size faster and slows bleeding (lochia) after you give birth.   Breastfeeding helps to lower your risk of developing type 2 diabetes mellitus, osteoporosis, and breast or ovarian cancer later in life. SIGNS THAT YOUR BABY IS  HUNGRY Early Signs of Hunger  Increased alertness or activity.  Stretching.  Movement of the head from side to side.  Movement of the head and opening of the mouth when the corner of the mouth or cheek is stroked (rooting).  Increased sucking sounds, smacking lips, cooing, sighing, or squeaking.  Hand-to-mouth movements.  Increased sucking of fingers or hands. Late Signs of Hunger  Fussing.  Intermittent crying. Extreme Signs of Hunger Signs of extreme hunger will require calming and consoling before your baby will be able to breastfeed successfully. Do not wait for the following signs of extreme hunger to occur before you initiate breastfeeding:  Restlessness.  A loud, strong cry.  Screaming.   BREASTFEEDING BASICS Breastfeeding Initiation  Find a comfortable place to sit or lie down, with your neck and back well supported.  Place a pillow or rolled up blanket under your baby to bring him or her to the level of your breast (if you are seated). Nursing pillows are specially designed to help support your arms and your baby while you breastfeed.  Make sure that your baby's abdomen is facing your abdomen.  Gently massage your breast. With your fingertips, massage from your chest wall toward your nipple in a circular motion. This encourages milk flow. You may need to continue this action during the feeding if your milk flows slowly.  Support your breast with 4 fingers underneath and your thumb above your nipple. Make sure your fingers are well away from your nipple and your baby's mouth.  Stroke your baby's lips gently with your finger or nipple.  When your baby's mouth is open wide enough, quickly bring your baby to your breast, placing your entire nipple and as much of the colored area around your nipple (areola) as possible into your baby's mouth.  More areola should be visible above your baby's upper lip than below the lower lip.  Your baby's tongue should be between his  or her lower gum and your breast.  Ensure that your baby's mouth is correctly positioned around your nipple (latched). Your baby's lips should create a seal on your breast and be turned out (everted).  It is common for your baby to suck about 2-3 minutes in order to start the flow of breast milk. Latching Teaching your baby how to latch on to your breast properly is very important. An improper latch can cause nipple pain and decreased milk supply for you and poor weight gain in your baby. Also, if your baby is not latched onto your nipple properly, he or she may swallow some air during feeding. This can make your baby fussy. Burping your baby when you switch breasts during the feeding can help to get rid of the air. However, teaching your baby to latch on properly is still the best way to prevent fussiness from swallowing air while breastfeeding. Signs that your baby has successfully latched on to your nipple:  Silent tugging or silent sucking, without causing you pain.  Swallowing heard between every 3-4 sucks.  Muscle movement above and in front of his or her ears while sucking. Signs that your baby has not successfully latched on to nipple:  Sucking sounds or smacking sounds from your baby while breastfeeding.  Nipple pain. If you think your baby has not latched on correctly, slip your finger into the corner of your baby's mouth to break the suction and place it between your baby's gums. Attempt breastfeeding initiation again. Signs of Successful Breastfeeding Signs from your baby:  A gradual decrease in the number of sucks or complete cessation of sucking.  Falling asleep.  Relaxation of his or her body.  Retention of a small amount of milk in his or her mouth.  Letting go of your breast by himself or herself. Signs from you:  Breasts that have increased in firmness, weight, and size 1-3 hours after feeding.  Breasts that are softer immediately after  breastfeeding.  Increased milk volume, as well as a change in milk consistency and color by the fifth day of breastfeeding.  Nipples that are not sore, cracked, or bleeding. Signs That Your Baby is Getting Enough Milk  Wetting at least 3 diapers in a 24-hour period.   The urine should be clear and pale yellow by age 5 days.  At least 3 stools in a 24-hour period by age 5 days. The stool should be soft and yellow.  At least 3 stools in a 24-hour period by age 7 days. The stool should be seedy and yellow.  No loss of weight greater than 10% of birth weight during the first 3 days of age.  Average weight gain of 4-7 ounces (113-198 g) per week after age 4 days.  Consistent daily weight gain by age 5 days, without weight loss after the age of 2 weeks. After a feeding, your baby may spit up a small amount. This is common. BREASTFEEDING FREQUENCY AND DURATION Frequent feeding will help you make more milk and can prevent sore nipples and breast engorgement. Breastfeed when you feel the need to reduce the fullness of your breasts or when your baby shows signs of hunger. This is called "breastfeeding on demand." Avoid introducing a pacifier to your baby while you are working to establish breastfeeding (the first 4-6 weeks after your baby is born). After this time you may choose to use a pacifier. Research has shown that pacifier use during the first year of a baby's life decreases the risk of sudden infant death syndrome (SIDS). Allow your baby to feed on each breast as long as he or she wants. Breastfeed until your baby is finished feeding. When your baby unlatches or falls asleep while feeding from the first breast, offer the second breast. Because newborns are often sleepy in the first few weeks of life, you may need to awaken your baby to get him or her to feed. Breastfeeding times will vary from baby to baby. However, the following rules can serve as a guide to help you ensure that your baby is  properly fed:  Newborns (babies 4 weeks of age or younger) may breastfeed every 1-3 hours.  Newborns should not go longer than 3 hours during the day or 5 hours during the night without breastfeeding.  You should breastfeed your baby a minimum of 8 times in a 24-hour period until you begin to introduce solid foods to your baby at around 6 months of age. BREAST MILK PUMPING Pumping and storing breast milk allows you to ensure that your baby is exclusively fed your breast milk, even at times when you are unable to breastfeed. This is especially important if you are going back to work while you are still breastfeeding or when you are not able to be present during feedings. Your lactation consultant can give you guidelines on how long it is safe to store breast milk. A breast pump is a machine that allows you to pump milk from your breast into a sterile bottle. The pumped breast milk can then be stored in a refrigerator or freezer. Some breast pumps are operated by hand, while others use electricity. Ask your lactation consultant which type will work best for you. Breast pumps can be purchased, but some hospitals and breastfeeding support groups lease breast pumps on a monthly basis. A lactation consultant can teach you how to hand express breast milk, if you prefer not to use a pump. CARING FOR YOUR BREASTS WHILE YOU BREASTFEED Nipples can become dry, cracked, and sore while breastfeeding. The following recommendations can help keep your breasts moisturized and healthy:  Avoid using soap on your nipples.  Wear a supportive bra. Although not required, special nursing bras and tank tops are designed to allow access to your   breasts for breastfeeding without taking off your entire bra or top. Avoid wearing underwire-style bras or extremely tight bras.  Air dry your nipples for 3-4minutes after each feeding.  Use only cotton bra pads to absorb leaked breast milk. Leaking of breast milk between feedings  is normal.  Use lanolin on your nipples after breastfeeding. Lanolin helps to maintain your skin's normal moisture barrier. If you use pure lanolin, you do not need to wash it off before feeding your baby again. Pure lanolin is not toxic to your baby. You may also hand express a few drops of breast milk and gently massage that milk into your nipples and allow the milk to air dry. In the first few weeks after giving birth, some women experience extremely full breasts (engorgement). Engorgement can make your breasts feel heavy, warm, and tender to the touch. Engorgement peaks within 3-5 days after you give birth. The following recommendations can help ease engorgement:  Completely empty your breasts while breastfeeding or pumping. You may want to start by applying warm, moist heat (in the shower or with warm water-soaked hand towels) just before feeding or pumping. This increases circulation and helps the milk flow. If your baby does not completely empty your breasts while breastfeeding, pump any extra milk after he or she is finished.  Wear a snug bra (nursing or regular) or tank top for 1-2 days to signal your body to slightly decrease milk production.  Apply ice packs to your breasts, unless this is too uncomfortable for you.  Make sure that your baby is latched on and positioned properly while breastfeeding. If engorgement persists after 48 hours of following these recommendations, contact your health care provider or a lactation consultant. OVERALL HEALTH CARE RECOMMENDATIONS WHILE BREASTFEEDING  Eat healthy foods. Alternate between meals and snacks, eating 3 of each per day. Because what you eat affects your breast milk, some of the foods may make your baby more irritable than usual. Avoid eating these foods if you are sure that they are negatively affecting your baby.  Drink milk, fruit juice, and water to satisfy your thirst (about 10 glasses a day).  Rest often, relax, and continue to take  your prenatal vitamins to prevent fatigue, stress, and anemia.  Continue breast self-awareness checks.  Avoid chewing and smoking tobacco. Chemicals from cigarettes that pass into breast milk and exposure to secondhand smoke may harm your baby.  Avoid alcohol and drug use, including marijuana. Some medicines that may be harmful to your baby can pass through breast milk. It is important to ask your health care provider before taking any medicine, including all over-the-counter and prescription medicine as well as vitamin and herbal supplements. It is possible to become pregnant while breastfeeding. If birth control is desired, ask your health care provider about options that will be safe for your baby. SEEK MEDICAL CARE IF:  You feel like you want to stop breastfeeding or have become frustrated with breastfeeding.  You have painful breasts or nipples.  Your nipples are cracked or bleeding.  Your breasts are red, tender, or warm.  You have a swollen area on either breast.  You have a fever or chills.  You have nausea or vomiting.  You have drainage other than breast milk from your nipples.  Your breasts do not become full before feedings by the fifth day after you give birth.  You feel sad and depressed.  Your baby is too sleepy to eat well.  Your baby is having trouble sleeping.     Your baby is wetting less than 3 diapers in a 24-hour period.  Your baby has less than 3 stools in a 24-hour period.  Your baby's skin or the white part of his or her eyes becomes yellow.   Your baby is not gaining weight by 5 days of age. SEEK IMMEDIATE MEDICAL CARE IF:  Your baby is overly tired (lethargic) and does not want to wake up and feed.  Your baby develops an unexplained fever.   This information is not intended to replace advice given to you by your health care provider. Make sure you discuss any questions you have with your health care provider.   Document Released: 03/04/2005  Document Revised: 11/23/2014 Document Reviewed: 08/26/2012 Elsevier Interactive Patient Education 2016 [employer]  

## 2015-11-02 LAB — GC/CHLAMYDIA PROBE AMP (~~LOC~~) NOT AT ARMC
CHLAMYDIA, DNA PROBE: NEGATIVE
NEISSERIA GONORRHEA: NEGATIVE

## 2015-11-05 LAB — CULTURE, STREPTOCOCCUS GRP B W/SUSCEPT

## 2015-11-08 ENCOUNTER — Ambulatory Visit (INDEPENDENT_AMBULATORY_CARE_PROVIDER_SITE_OTHER): Payer: Medicaid Other | Admitting: Family Medicine

## 2015-11-08 VITALS — BP 122/76 | HR 61 | Wt 143.0 lb

## 2015-11-08 DIAGNOSIS — O09513 Supervision of elderly primigravida, third trimester: Secondary | ICD-10-CM

## 2015-11-08 DIAGNOSIS — R8271 Bacteriuria: Secondary | ICD-10-CM

## 2015-11-08 DIAGNOSIS — Z3483 Encounter for supervision of other normal pregnancy, third trimester: Secondary | ICD-10-CM

## 2015-11-08 DIAGNOSIS — O99820 Streptococcus B carrier state complicating pregnancy: Secondary | ICD-10-CM

## 2015-11-08 NOTE — Progress Notes (Signed)
Subjective:  [name] [name] is a 35 y.o. G3P0020 at [redacted]w[redacted]d being seen today for ongoing prenatal care.  She is currently monitored for the following issues for this high-risk pregnancy and has Supervision of normal pregnancy, antepartum; GBS bacteriuria; Hematuria of undiagnosed cause; Migraine headache; Uterine fibroids affecting pregnancy in third trimester; and AMA (advanced maternal age) primigravida 35+ on her problem list.  Patient reports no complaints.  Contractions: Irregular. Vag. Bleeding: None.  Movement: Present. Denies leaking of fluid.   The following portions of the patient's history were reviewed and updated as appropriate: allergies, current medications, past family history, past medical history, past social history, past surgical history and problem list. Problem list updated.  Objective:   Vitals:   11/08/15 1112  BP: 122/76  Pulse: 61  Weight: 143 lb (64.9 kg)    Fetal Status: Fetal Heart Rate (bpm): 132   Movement: Present     General:  Alert, oriented and cooperative. Patient is in no acute distress.  Skin: Skin is warm and dry. No rash noted.   Cardiovascular: Normal heart rate noted  Respiratory: Normal respiratory effort, no problems with respiration noted  Abdomen: Soft, gravid, appropriate for gestational age. Pain/Pressure: Present     Pelvic:  Cervical exam performed        Extremities: Normal range of motion.  Edema: None  Mental Status: Normal mood and affect. Normal behavior. Normal judgment and thought content.   Urinalysis:      Assessment and Plan:  Pregnancy: G3P0020 at [redacted]w[redacted]d  1. Supervision of normal pregnancy, antepartum, third trimester Up [name] date GBS results reviewed Signed water birth consent today, attended class, planning on labor ladies for tub  2. GBS bacteriuria Clinda-resistant  Will need vancomycin in labor  3. AMA (advanced maternal age) primigravida 4+, third trimester Declined screening  Term labor symptoms and general  obstetric precautions including but not limited [name] vaginal bleeding, contractions, leaking of fluid and fetal movement were reviewed in detail with the patient. Please refer [name] After Visit Summary for other counseling recommendations.   Return in about 1 week (around 11/15/2015) for Routine prenatal care.   Caren Macadam, MD

## 2015-11-08 NOTE — Patient Instructions (Addendum)
Start eating more vegetables/leafy greens Start Colace 100mg  BID IF you need it, start Miralax 1 capful daily.     Natural Childbirth Natural childbirth is going through labor and delivery without any drugs to relieve pain. You also do not use fetal monitors, have a cesarean delivery, or get a sugical cut to enlarge the vaginal opening (episiotomy). With the help of a birthing professional (midwife), you will direct your own labor and delivery as you choose. Many women chose natural childbirth because they feel more in control and in touch with their labor and delivery. They are also concerned about the medications affecting themselves and the baby. Pregnant women with a high risk pregnancy should not attempt natural childbirth. It is better to deliver the infant in a hospital if an emergency situation arises. Sometimes, the caregiver has to intervene for the health and safety of the mother and infant. TWO TECHNIQUES FOR NATURAL CHILDBIRTH:   The Lamaze method. This method teaches women that having a baby is normal, healthy, and natural. It also teaches the mother to take a neutral position regarding pain medication and anesthesia and to make an informed decision if and when it is right for them.  The Hulan Fray (also called husband coached birth). This method teaches the father to be the birth coach and stresses a natural approach. It also encourages exercise and a balanced diet with good nutrition. The exercises teach relaxation and deep breathing techniques. However, there are also classes to prepare the parents for an emergency situation that may occur. METHODS OF DEALING WITH LABOR PAIN AND DELIVERY:  Meditation.  Yoga.  Hypnosis.  Acupuncture.  Massage.  Changing positions (walking, rocking, showering, leaning on birth balls).  Lying in warm water or a jacuzzi.  Find an activity that keeps your mind off of the labor pain.  Listen to soft music.  Visual imagery (focus on  a particular object). BEFORE GOING INTO LABOR  Be sure you and your spouse/partner are in agreement to have natural childbirth.  Decide if your caregiver or a midwife will deliver your baby.  Decide if you will have your baby in the hospital, birthing center, or at home.  If you have children, make plans to have someone to take care of them when you go to the hospital.  Know the distance and the time it takes to go to the delivery center. Make a dry run to be sure.  Have a bag packed with a night gown, bathrobe, and toiletries ready to take when you go into labor.  Keep phone numbers of your family and friends handy if you need to call someone when you go into labor.  Your spouse or partner should go to all the teaching classes.  Talk with your caregiver about the possibility of a medical emergency and what will happen if that occurs. ADVANTAGES OF NATURAL CHILDBIRTH  You are in control of your labor and delivery.  It is safe.  There are no medications or anesthetics that may affect you and the fetus.  There are no invasive procedures such as an episiotomy.  You and your partner will work together, which can increase your bond.  Meditation, yoga, massage, and breathing exercises can be learned while pregnant and help you when you are in labor and at delivery.  In most delivery centers, the family and friends can be involved in the labor and delivery process. DISADVANTAGES OF NATURAL CHILDBIRTH  You will experience pain during your labor and delivery.  The  methods of helping relieve your labor pains may not work for you.  You may feel embarrassed, disappointed, and like a failure if you decide to change your mind during labor and not have natural childbirth. AFTER THE DELIVERY  You will be very tired.  You will be uncomfortable because of your uterus contracting. You will feel soreness around the vagina.  You may feel cold and shaky.This is a natural reaction.  You  will be excited, overwhelmed, accomplished, and proud to be a mother. HOME CARE INSTRUCTIONS   Follow the advice and instructions of your caregiver.  Follow the instructions of your natural childbirth instructor (Lamaze or [place]).   This information is not intended to replace advice given to you by your health care provider. Make sure you discuss any questions you have with your health care provider.   Document Released: 02/15/2008 Document Revised: 05/27/2011 Document Reviewed: 11/09/2012 Elsevier Interactive Patient Education [employer].

## 2015-11-14 ENCOUNTER — Ambulatory Visit (INDEPENDENT_AMBULATORY_CARE_PROVIDER_SITE_OTHER): Payer: Medicaid Other | Admitting: Obstetrics & Gynecology

## 2015-11-14 VITALS — BP 130/87 | HR 60 | Wt 148.0 lb

## 2015-11-14 DIAGNOSIS — O133 Gestational [pregnancy-induced] hypertension without significant proteinuria, third trimester: Secondary | ICD-10-CM | POA: Diagnosis not present

## 2015-11-14 DIAGNOSIS — Z3483 Encounter for supervision of other normal pregnancy, third trimester: Secondary | ICD-10-CM

## 2015-11-14 DIAGNOSIS — O163 Unspecified maternal hypertension, third trimester: Secondary | ICD-10-CM

## 2015-11-14 NOTE — Progress Notes (Signed)
   PRENATAL VISIT NOTE  Subjective:  [name] [name] is a 35 y.o. G3P0020 at [redacted]w[redacted]d being seen today for ongoing prenatal care.  She is currently monitored for the following issues for this low-risk pregnancy and has Supervision of normal pregnancy, antepartum; GBS bacteriuria; Hematuria of undiagnosed cause; Migraine headache; Uterine fibroids affecting pregnancy in third trimester; and AMA (advanced maternal age) primigravida 35+ on her problem list.  Patient reports no complaints.  Contractions: Irregular. Vag. Bleeding: None.  Movement: Present. Denies leaking of fluid.    The following portions of the patient's history were reviewed and updated as appropriate: allergies, current medications, past family history, past medical history, past social history, past surgical history and problem list. Problem list updated.  Objective:   Vitals:   11/14/15 1457 11/14/15 1500  BP: (!) 151/89 130/87  Pulse: (!) 57 60  Weight: 148 lb (67.1 kg)     Fetal Status: Fetal Heart Rate (bpm): 120 Fundal Height: 38 cm Movement: Present  Presentation: Vertex  General:  Alert, oriented and cooperative. Patient is in no acute distress.  Skin: Skin is warm and dry. No rash noted.   Cardiovascular: Normal heart rate noted  Respiratory: Normal respiratory effort, no problems with respiration noted  Abdomen: Soft, gravid, appropriate for gestational age. Pain/Pressure: Present     Pelvic:  Cervical exam performed Dilation: 1 Effacement (%): 50 Station: -3  Extremities: Normal range of motion.  Edema: Trace  Mental Status: Normal mood and affect. Normal behavior. Normal judgment and thought content.   Urinalysis:      Assessment and Plan:  Pregnancy: G3P0020 at [redacted]w[redacted]d  1. Elevated blood pressure complicating pregnancy, antepartum, third trimester Will follow up labs, also return tomorrow for repeat BP check. No headaches, visual symptoms, RUQ/epigastric pain.  GHTN/preeclampsia precautions reviewed. -  CBC - Comprehensive metabolic panel - Protein / creatinine ratio, urine - Fetal nonstress test  2. Supervision of normal pregnancy, antepartum, third trimester Term labor symptoms and general obstetric precautions including but not limited to vaginal bleeding, contractions, leaking of fluid and fetal movement were reviewed in detail with the patient. Please refer to After Visit Summary for other counseling recommendations.  Return in about 1 day (around 11/15/2015) for BP check.  Osborne Oman, MD

## 2015-11-14 NOTE — Patient Instructions (Signed)

## 2015-11-15 ENCOUNTER — Encounter (HOSPITAL_COMMUNITY): Payer: Self-pay | Admitting: *Deleted

## 2015-11-15 ENCOUNTER — Inpatient Hospital Stay (HOSPITAL_COMMUNITY)
Admission: AD | Admit: 2015-11-15 | Discharge: 2015-11-18 | DRG: 775 | Disposition: A | Discharge status: Home / Self Care | Payer: Medicaid Other | Source: Ambulatory Visit | Attending: Obstetrics & Gynecology | Admitting: Obstetrics & Gynecology

## 2015-11-15 ENCOUNTER — Ambulatory Visit: Payer: Medicaid Other | Admitting: *Deleted

## 2015-11-15 VITALS — BP 141/91 | HR 67

## 2015-11-15 DIAGNOSIS — D259 Leiomyoma of uterus, unspecified: Secondary | ICD-10-CM | POA: Diagnosis present

## 2015-11-15 DIAGNOSIS — R8271 Bacteriuria: Secondary | ICD-10-CM

## 2015-11-15 DIAGNOSIS — O99824 Streptococcus B carrier state complicating childbirth: Secondary | ICD-10-CM | POA: Diagnosis present

## 2015-11-15 DIAGNOSIS — Z8249 Family history of ischemic heart disease and other diseases of the circulatory system: Secondary | ICD-10-CM

## 2015-11-15 DIAGNOSIS — O134 Gestational [pregnancy-induced] hypertension without significant proteinuria, complicating childbirth: Principal | ICD-10-CM | POA: Diagnosis present

## 2015-11-15 DIAGNOSIS — Z880 Allergy status to penicillin: Secondary | ICD-10-CM | POA: Diagnosis not present

## 2015-11-15 DIAGNOSIS — O139 Gestational [pregnancy-induced] hypertension without significant proteinuria, unspecified trimester: Secondary | ICD-10-CM | POA: Diagnosis present

## 2015-11-15 DIAGNOSIS — O3413 Maternal care for benign tumor of corpus uteri, third trimester: Secondary | ICD-10-CM | POA: Diagnosis present

## 2015-11-15 DIAGNOSIS — Z3A38 38 weeks gestation of pregnancy: Secondary | ICD-10-CM

## 2015-11-15 DIAGNOSIS — O163 Unspecified maternal hypertension, third trimester: Secondary | ICD-10-CM

## 2015-11-15 DIAGNOSIS — R319 Hematuria, unspecified: Secondary | ICD-10-CM

## 2015-11-15 DIAGNOSIS — O133 Gestational [pregnancy-induced] hypertension without significant proteinuria, third trimester: Secondary | ICD-10-CM

## 2015-11-15 DIAGNOSIS — Z3483 Encounter for supervision of other normal pregnancy, third trimester: Secondary | ICD-10-CM

## 2015-11-15 HISTORY — DX: Headache, unspecified: R51.9

## 2015-11-15 HISTORY — DX: Headache: R51

## 2015-11-15 HISTORY — DX: Essential (primary) hypertension: I10

## 2015-11-15 LAB — CBC
HEMATOCRIT: 33.5 % — AB (ref 35.0–45.0)
HEMATOCRIT: 33.2 % — AB (ref 36.0–46.0)
HEMOGLOBIN: 11.4 g/dL — AB (ref 12.0–15.0)
Hemoglobin: 11.1 g/dL — ABNORMAL LOW (ref 11.7–15.5)
MCH: 30.2 pg (ref 27.0–33.0)
MCH: 30.1 pg (ref 26.0–34.0)
MCHC: 33.1 g/dL (ref 32.0–36.0)
MCHC: 34.3 g/dL (ref 30.0–36.0)
MCV: 91 fL (ref 80.0–100.0)
MCV: 87.6 fL (ref 78.0–100.0)
MPV: 11.4 fL (ref 7.5–12.5)
Platelets: 161 10*3/uL (ref 140–400)
Platelets: 161 10*3/uL (ref 150–400)
RBC: 3.68 MIL/uL — AB (ref 3.80–5.10)
RBC: 3.79 MIL/uL — AB (ref 3.87–5.11)
RDW: 13.6 % (ref 11.0–15.0)
RDW: 13.1 % (ref 11.5–15.5)
WBC: 6.7 10*3/uL (ref 3.8–10.8)
WBC: 7.1 10*3/uL (ref 4.0–10.5)

## 2015-11-15 LAB — COMPREHENSIVE METABOLIC PANEL
ALBUMIN: 3.4 g/dL — AB (ref 3.5–5.0)
ALK PHOS: 89 U/L (ref 33–115)
ALT: 8 U/L (ref 6–29)
ALT: 13 U/L — AB (ref 14–54)
AST: 18 U/L (ref 10–30)
AST: 22 U/L (ref 15–41)
Albumin: 3.4 g/dL — ABNORMAL LOW (ref 3.6–5.1)
Alkaline Phosphatase: 89 U/L (ref 38–126)
Anion gap: 6 (ref 5–15)
BUN: 7 mg/dL (ref 7–25)
BUN: 8 mg/dL (ref 6–20)
CALCIUM: 9.1 mg/dL (ref 8.6–10.2)
CHLORIDE: 104 mmol/L (ref 98–110)
CHLORIDE: 104 mmol/L (ref 101–111)
CO2: 24 mmol/L (ref 20–31)
CO2: 25 mmol/L (ref 22–32)
Calcium: 9.2 mg/dL (ref 8.9–10.3)
Creat: 0.88 mg/dL (ref 0.50–1.10)
Creatinine, Ser: 0.77 mg/dL (ref 0.44–1.00)
GFR calc Af Amer: >60 mL/min (ref >60)
GFR calc non Af Amer: >60 mL/min (ref >60)
GLUCOSE: 69 mg/dL (ref 65–99)
Glucose, Bld: 88 mg/dL (ref 65–99)
POTASSIUM: 4.5 mmol/L (ref 3.5–5.3)
POTASSIUM: 3.7 mmol/L (ref 3.5–5.1)
SODIUM: 135 mmol/L (ref 135–145)
Sodium: 138 mmol/L (ref 135–146)
TOTAL PROTEIN: 6.1 g/dL (ref 6.1–8.1)
Total Bilirubin: 0.3 mg/dL (ref 0.2–1.2)
Total Bilirubin: 0.6 mg/dL (ref 0.3–1.2)
Total Protein: 6.7 g/dL (ref 6.5–8.1)

## 2015-11-15 LAB — TYPE AND SCREEN
ABO/RH(D): B POS
Antibody Screen: NEGATIVE

## 2015-11-15 LAB — ABO/RH: ABO/RH(D): B POS

## 2015-11-15 LAB — PROTEIN / CREATININE RATIO, URINE
Creatinine, Urine: 42 mg/dL
Protein Creatinine Ratio: 0.24 mg/mg{Cre} — ABNORMAL HIGH (ref 0.00–0.15)
Total Protein, Urine: 10 mg/dL

## 2015-11-15 MED ORDER — ONDANSETRON HCL 4 MG/2ML IJ SOLN
4.0000 mg | Freq: Four times a day (QID) | INTRAMUSCULAR | Status: DC | PRN
  Administered 2015-11-15: 4 mg via INTRAVENOUS
  Filled 2015-11-15: qty 2

## 2015-11-15 MED ORDER — OXYCODONE-ACETAMINOPHEN 5-325 MG PO TABS: 1.0000 | ORAL_TABLET | ORAL | Status: DC | PRN

## 2015-11-15 MED ORDER — ACETAMINOPHEN 325 MG PO TABS: 650.0000 mg | ORAL_TABLET | ORAL | Status: DC | PRN

## 2015-11-15 MED ORDER — OXYTOCIN BOLUS FROM INFUSION
500.0000 mL | Freq: Once | INTRAVENOUS | Status: AC
  Administered 2015-11-16: 500 mL via INTRAVENOUS

## 2015-11-15 MED ORDER — LIDOCAINE HCL (PF) 1 % IJ SOLN
30.0000 mL | INTRAMUSCULAR | Status: DC | PRN
  Administered 2015-11-16: 30 mL via SUBCUTANEOUS
  Filled 2015-11-15: qty 30

## 2015-11-15 MED ORDER — VANCOMYCIN HCL IN DEXTROSE 1-5 GM/200ML-% IV SOLN
1000.0000 mg | Freq: Two times a day (BID) | INTRAVENOUS | Status: DC
  Administered 2015-11-15: 1000 mg via INTRAVENOUS
  Filled 2015-11-15 (×2): qty 200

## 2015-11-15 MED ORDER — OXYTOCIN 40 UNITS IN LACTATED RINGERS INFUSION - SIMPLE MED
2.5000 [IU]/h | INTRAVENOUS | Status: DC
  Filled 2015-11-15: qty 1000

## 2015-11-15 MED ORDER — BUTORPHANOL TARTRATE 2 MG/ML IJ SOLN
2.0000 mg | INTRAMUSCULAR | Status: DC | PRN
  Administered 2015-11-15: 2 mg via INTRAVENOUS
  Filled 2015-11-15: qty 2

## 2015-11-15 MED ORDER — LACTATED RINGERS IV SOLN
INTRAVENOUS | Status: DC
  Administered 2015-11-15 (×2): via INTRAVENOUS

## 2015-11-15 MED ORDER — PROMETHAZINE HCL 25 MG/ML IJ SOLN
12.5000 mg | Freq: Four times a day (QID) | INTRAMUSCULAR | Status: DC | PRN
  Administered 2015-11-15: 12.5 mg via INTRAVENOUS
  Filled 2015-11-15: qty 1

## 2015-11-15 MED ORDER — TERBUTALINE SULFATE 1 MG/ML IJ SOLN
0.2500 mg | Freq: Once | INTRAMUSCULAR | Status: DC | PRN
  Filled 2015-11-15: qty 1

## 2015-11-15 MED ORDER — LACTATED RINGERS IV SOLN: 500.0000 mL | INTRAVENOUS | Status: DC | PRN

## 2015-11-15 MED ORDER — MISOPROSTOL 25 MCG QUARTER TABLET
25.0000 ug | ORAL_TABLET | ORAL | Status: DC | PRN
  Administered 2015-11-15: 25 ug via VAGINAL
  Filled 2015-11-15: qty 1
  Filled 2015-11-15: qty 0.25

## 2015-11-15 MED ORDER — OXYCODONE-ACETAMINOPHEN 5-325 MG PO TABS
2.0000 | ORAL_TABLET | ORAL | Status: DC | PRN
  Administered 2015-11-16: 2 via ORAL
  Filled 2015-11-15: qty 2

## 2015-11-15 MED ORDER — SOD CITRATE-CITRIC ACID 500-334 MG/5ML PO SOLN
30.0000 mL | ORAL | Status: DC | PRN
  Administered 2015-11-15: 30 mL via ORAL
  Filled 2015-11-15: qty 15

## 2015-11-15 NOTE — Anesthesia Pain Management Evaluation Note (Signed)
  CRNA Pain Management Visit Note  Patient: [name] [name], 35 y.o., female  "Hello I am a member of the anesthesia team at [facility]. We have an anesthesia team available at all times to provide care throughout the hospital, including epidural management and anesthesia for C-section. I don't know your plan for the delivery whether it a natural birth, water birth, IV sedation, nitrous supplementation, doula or epidural, but we want to meet your pain goals."   1.Was your pain managed to your expectations on prior hospitalizations?   No prior hospitalizations  2.What is your expectation for pain management during this hospitalization?     Labor support without medications  3.How can we help you reach that goal? Be available if needed  Record the patient's initial score and the patient's pain goal.   Pain: 4  Pain Goal: 8 The [facility] wants you to be able to say your pain was always managed very well.  [name] [name] 11/15/2015

## 2015-11-15 NOTE — Progress Notes (Signed)
[name] [name] is a 35 y.o. female G40P0020 at [redacted]w[redacted]d presenting for IOL due to gestational hypertension. She reported a normal pregnancy up until yesterday when they noticed high blood pressures. The high blood pressures continued today. Since yesterday, she has experienced dizziness, headaches, a little bit of swelling, and blurry vision.   Up until yesterday, she reports no issues with the pregnancy and has received regular prenatal care.   For her delivery, she requests delayed cord clamping. Currently she does not want any pain medication.   She is giving birth to a boy and plans on doing circumcision outpatient. She plans on breastfeeding. No plans for contraception.      OB History    Gravida Para Term Preterm AB Living   3       2 0   SAB TAB Ectopic Multiple Live Births   2             Past Medical History:  Diagnosis Date  . Headache   . Hypertension    Past Surgical History:  Procedure Laterality Date  . LEG SURGERY Left 1999   steel rod in left femur bone    Family History: family history includes Heart disease in her maternal aunt, maternal grandfather, maternal uncle, paternal aunt, and paternal grandfather; Hyperlipidemia in her mother. Social History:  reports that she has never smoked. She has never used smokeless tobacco. She reports that she drinks alcohol. She reports that she does not use drugs.     Maternal Diabetes: No Genetic Screening: Normal Maternal Ultrasounds/Referrals: Normal Fetal Ultrasounds or other Referrals:  None Maternal Substance Abuse:  No Significant Maternal Medications:  None Significant Maternal Lab Results:  Lab values include: Group B Strep positive Other Comments:  None  Review of Systems  Constitutional: Negative for chills, fever and weight loss.  Eyes: Positive for blurred vision.  Cardiovascular: Positive for leg swelling.  Neurological: Positive for dizziness and headaches.   History   Blood pressure (!) 161/92, pulse  (!) 56, temperature 98.2 F (36.8 C), temperature source Oral, resp. rate 18, height 5' 0.5" (1.537 m), weight 67.1 kg (148 lb), last menstrual period 02/19/2015. Exam Physical Exam  Constitutional: She appears well-developed and well-nourished. No distress.    Prenatal labs: ABO, Rh: --/--/B POS (08/30 1400) Antibody: NEG (08/30 1400) Rubella: 3.04 (02/14 1006) RPR: NON REAC (06/20 1125)  HBsAg: NEGATIVE (02/14 1006)  HIV: NONREACTIVE (06/20 1125)  GBS:  POSITIVE  Assessment/Plan: [name] [name] is a 35yo G3P0010 at [redacted]w[redacted]d presenting for IOL due to gestational hypertension.   #Labor: Induction #Pain: None #FWB: Category 1 #ID: GBS+ #MOF: Breast #MOC: None #circ: Outpatient   Demetrios Isaacs 11/15/2015, 3:21 PM   OB FELLOW MEDICAL STUDENT NOTE ATTESTATION  I have seen and examined this patient. Note this is a Careers information officer note and as such does not necessarily reflect the patient's plan of care. Please see H&P note for this date of service.    Katherine Basset, DO OB Fellow 11/15/2015, 8:44 PM

## 2015-11-15 NOTE — Progress Notes (Signed)
Patient ID: [name] [name], female   DOB: [birth year], 35 y.o.   MRN: [id number] Pt standing and kneeling on side of bed, breathing well through contractions SVE: 3/90/+1 intact IV Vanc for GBS ppx Plans natural birth Plan: NSVD  I have seen and examined this patient and I agree with the above. Serita Grammes CNM 9:40 AM 11/16/2015

## 2015-11-15 NOTE — H&P (Signed)
LABOR AND DELIVERY ADMISSION HISTORY AND PHYSICAL NOTE  [name] [name] is a 35 y.o. female G53P0020 with IUP at [redacted]w[redacted]d by LMP c/w 10 wk [place] presenting for IOL for gestational HTN.   Patient was noted to have high blood pressure in the office for the first time in pregnancy yesterday, with BPs in 140s/90s. She returned to office this AM to have a repeat BP check, and was still found to be 140s/90s. Therefore it was determined to have patient be induced for gestational hypertension. She did mention that she noted some blurred vision last night and this morning before the appt, but it has since resolved. Denies headaches, RUQ/epigastric pain, or currently no vision changes.    She reports positive fetal movement. She denies leakage of fluid or vaginal bleeding.  Prenatal History/Complications:  Uncomplicated until yesterday/today, now gestational HTN. Fibroid uterus.  Past Medical History: Past Medical History:  Diagnosis Date  . Headache   . Hypertension     Past Surgical History: Past Surgical History:  Procedure Laterality Date  . LEG SURGERY Left 1999   steel rod in left femur bone     Obstetrical History: OB History    Gravida Para Term Preterm AB Living   3       2 0   SAB TAB Ectopic Multiple Live Births   2              Social History: Social History   Social History  . Marital status: Married    Spouse name: N/A  . Number of children: N/A  . Years of education: N/A   Social History Main Topics  . Smoking status: Never Smoker  . Smokeless tobacco: Never Used  . Alcohol use Yes     Comment: wine   . Drug use: No  . Sexual activity: Yes    Birth control/ protection: None   Other Topics Concern  . None   Social History Narrative  . None    Family History: Family History  Problem Relation Age of Onset  . Hyperlipidemia Mother   . Heart disease Maternal Aunt   . Heart disease Maternal Uncle   . Heart disease Paternal Aunt   . Heart disease Maternal  Grandfather   . Heart disease Paternal Grandfather     Allergies: Allergies  Allergen Reactions  . Bactrim [Sulfamethoxazole-Trimethoprim] Hives and Swelling  . Penicillins     Fever-pt was really sick. Has patient had a PCN reaction causing immediate rash, facial/tongue/throat swelling, SOB or lightheadedness with hypotension: No Has patient had a PCN reaction causing severe rash involving mucus membranes or skin necrosis: No Has patient had a PCN reaction that required hospitalization Yes Has patient had a PCN reaction occurring within the last 10 years: No If all of the above answers are "NO", then may proceed with Cephalosporin use.     Prescriptions Prior to Admission  Medication Sig Dispense Refill Last Dose  . acetaminophen (TYLENOL) 500 MG tablet Take 2 tablets (1,000 mg total) by mouth every 6 (six) hours as needed for moderate pain. 60 tablet 2 Past Month at Unknown time  . Prenatal Vit-Fe Fumarate-FA (MULTIVITAMIN-PRENATAL) 27-0.8 MG TABS tablet Take 1 tablet by mouth daily at 12 noon.   Past Month at Unknown time  . calcium carbonate (TUMS EX) 750 MG chewable tablet Chew 2 tablets by mouth 2 (two) times daily as needed.    10/25/2015     Review of Systems   All systems reviewed and negative  except as stated in HPI  Blood pressure (!) 145/94, pulse (!) 59, temperature 98.2 F (36.8 C), temperature source Oral, resp. rate 20, height 5' 0.5" (1.537 m), weight 148 lb (67.1 kg), last menstrual period 02/19/2015. General appearance: alert, cooperative, appears stated age, no distress and very pleasant Lungs: clear to auscultation bilaterally Heart: regular rate and rhythm Abdomen: soft, non-tender; bowel sounds normal Extremities: No calf swelling or tenderness Presentation: cephalic Fetal monitoring: 130 bpm, mod var, +accels, no decels Uterine activity: Infrequent, is not feeling.     Prenatal labs: ABO, Rh: --/--/B POS (08/30 1400) Antibody: NEG (08/30  1400) Rubella: !Error! RPR: NON REAC (06/20 1125)  HBsAg: NEGATIVE (02/14 1006)  HIV: NONREACTIVE (06/20 1125)  GBS:    1 hr Glucola: 95, normal Genetic screening:  declined Anatomy US: Normal, female, fibroids  Prenatal Transfer Tool  Maternal Diabetes: No Genetic Screening: Declined Maternal Ultrasounds/Referrals: Abnormal:  Findings:   Other: Fibroids Fetal Ultrasounds or other Referrals:  None Maternal Substance Abuse:  No Significant Maternal Medications:  None Significant Maternal Lab Results: None  Results for orders placed or performed during the hospital encounter of 11/15/15 (from the past 24 hour(s))  CBC   Collection Time: 11/15/15  2:00 PM  Result Value Ref Range   WBC 7.1 4.0 - 10.5 K/uL   RBC 3.79 (L) 3.87 - 5.11 MIL/uL   Hemoglobin 11.4 (L) 12.0 - 15.0 g/dL   HCT 33.2 (L) 36.0 - 46.0 %   MCV 87.6 78.0 - 100.0 fL   MCH 30.1 26.0 - 34.0 pg   MCHC 34.3 30.0 - 36.0 g/dL   RDW 13.1 11.5 - 15.5 %   Platelets 161 150 - 400 K/uL  Comprehensive metabolic panel   Collection Time: 11/15/15  2:00 PM  Result Value Ref Range   Sodium 135 135 - 145 mmol/L   Potassium 3.7 3.5 - 5.1 mmol/L   Chloride 104 101 - 111 mmol/L   CO2 25 22 - 32 mmol/L   Glucose, Bld 69 65 - 99 mg/dL   BUN 8 6 - 20 mg/dL   Creatinine, Ser 0.77 0.44 - 1.00 mg/dL   Calcium 9.2 8.9 - 10.3 mg/dL   Total Protein 6.7 6.5 - 8.1 g/dL   Albumin 3.4 (L) 3.5 - 5.0 g/dL   AST 22 15 - 41 U/L   ALT 13 (L) 14 - 54 U/L   Alkaline Phosphatase 89 38 - 126 U/L   Total Bilirubin 0.6 0.3 - 1.2 mg/dL   GFR calc non Af Amer >60 >60 mL/min   GFR calc Af Amer >60 >60 mL/min   Anion gap 6 5 - 15  Type and screen [place]   Collection Time: 11/15/15  2:00 PM  Result Value Ref Range   ABO/RH(D) B POS    Antibody Screen NEG    Sample Expiration 11/18/2015   Protein / creatinine ratio, urine   Collection Time: 11/15/15  2:12 PM  Result Value Ref Range   Creatinine, Urine 42.00 mg/dL   Total  Protein, Urine 10 mg/dL   Protein Creatinine Ratio 0.24 (H) 0.00 - 0.15 mg/mg[Cre]    Patient Active Problem List   Diagnosis Date Noted  . Gestational hypertension 11/15/2015  . AMA (advanced maternal age) primigravida 35+ 10/06/2015  . Uterine fibroids affecting pregnancy in third trimester 09/22/2015  . Hematuria of undiagnosed cause 05/24/2015  . Migraine headache 05/24/2015  . GBS bacteriuria 05/06/2015  . Supervision of normal pregnancy, antepartum 05/02/2015  Assessment: [name] [name] is a 35 y.o. G3P0020 at [redacted]w[redacted]d here for IOL for gestational HTN.  #Labor: IOL, start with cytotec PV #Pain: Epidural when requested #FWB:  Cat I #ID:  GBS Positive - PCN allergy/Clinda resistant, will use Vancomycin #MOF:  Breast #MOC: Condoms #Circ:  Outpatient circ  Katherine Basset, DO 11/15/2015, 4:09 PM

## 2015-11-15 NOTE — Progress Notes (Signed)
Subjective:  [name] [name] is a 35 y.o. female here for BP check and labs.  Current Outpatient Prescriptions  Medication Sig Dispense Refill  . acetaminophen (TYLENOL) 500 MG tablet Take 2 tablets (1,000 mg total) by mouth every 6 (six) hours as needed for moderate pain. 60 tablet 2  . butalbital-acetaminophen-caffeine (FIORICET/CODEINE) 50-325-40-30 MG capsule Take 1 capsule by mouth every 4 (four) hours as needed for headache. 30 capsule 0  . calcium carbonate (TUMS EX) 750 MG chewable tablet Chew 2 tablets by mouth 2 (two) times daily.    . Prenatal Vit-Fe Fumarate-FA (MULTIVITAMIN-PRENATAL) 27-0.8 MG TABS tablet Take 1 tablet by mouth daily at 12 noon.     No current facility-administered medications for this visit.     Hypertension ROS: patient does not perform home BP monitoring, no chest pain on exertion, no dyspnea on exertion and no swelling of ankles.  New concerns: Elevated BP in office yesterday and elevated on recheck today.   Objective:  BP (!) 141/91   Pulse 67   LMP 02/19/2015   Appearance alert, well appearing, and in no distress. .   Assessment:   Elevated blood pressure during pregnancy   Plan:  Report to [facility] for Preeclampsia evaluation  CMP, CBC, Protein/Creatinine sent to lab today

## 2015-11-16 ENCOUNTER — Encounter: Payer: Medicaid Other | Admitting: Obstetrics and Gynecology

## 2015-11-16 ENCOUNTER — Encounter (HOSPITAL_COMMUNITY): Payer: Self-pay

## 2015-11-16 DIAGNOSIS — Z3A38 38 weeks gestation of pregnancy: Secondary | ICD-10-CM

## 2015-11-16 DIAGNOSIS — O134 Gestational [pregnancy-induced] hypertension without significant proteinuria, complicating childbirth: Secondary | ICD-10-CM

## 2015-11-16 LAB — RPR: RPR Ser Ql: NONREACTIVE

## 2015-11-16 LAB — PROTEIN / CREATININE RATIO, URINE
CREATININE, URINE: 71 mg/dL (ref 20–320)
Protein Creatinine Ratio: 85 mg/g creat (ref 21–161)
Total Protein, Urine: 6 mg/dL (ref 5–24)

## 2015-11-16 LAB — CBC
HEMATOCRIT: 29.2 % — AB (ref 36.0–46.0)
HEMOGLOBIN: 10.3 g/dL — AB (ref 12.0–15.0)
MCH: 30.4 pg (ref 26.0–34.0)
MCHC: 35.3 g/dL (ref 30.0–36.0)
MCV: 86.1 fL (ref 78.0–100.0)
Platelets: 156 10*3/uL (ref 150–400)
RBC: 3.39 MIL/uL — AB (ref 3.87–5.11)
RDW: 12.9 % (ref 11.5–15.5)
WBC: 15.9 10*3/uL — ABNORMAL HIGH (ref 4.0–10.5)

## 2015-11-16 MED ORDER — ZOLPIDEM TARTRATE 5 MG PO TABS: 5.0000 mg | ORAL_TABLET | Freq: Every evening | ORAL | Status: DC | PRN

## 2015-11-16 MED ORDER — BENZOCAINE-MENTHOL 20-0.5 % EX AERO
1.0000 "application " | INHALATION_SPRAY | CUTANEOUS | Status: DC | PRN
  Administered 2015-11-16 (×2): 1 via TOPICAL
  Filled 2015-11-16 (×2): qty 56

## 2015-11-16 MED ORDER — ACETAMINOPHEN 325 MG PO TABS
650.0000 mg | ORAL_TABLET | ORAL | Status: DC | PRN
  Administered 2015-11-17: 650 mg via ORAL
  Filled 2015-11-16: qty 2

## 2015-11-16 MED ORDER — TETANUS-DIPHTH-ACELL PERTUSSIS 5-2.5-18.5 LF-MCG/0.5 IM SUSP: 0.5000 mL | Freq: Once | INTRAMUSCULAR | Status: DC

## 2015-11-16 MED ORDER — SENNOSIDES-DOCUSATE SODIUM 8.6-50 MG PO TABS
2.0000 | ORAL_TABLET | ORAL | Status: DC
  Administered 2015-11-16 – 2015-11-17 (×2): 2 via ORAL
  Filled 2015-11-16 (×2): qty 2

## 2015-11-16 MED ORDER — ONDANSETRON HCL 4 MG PO TABS: 4.0000 mg | ORAL_TABLET | ORAL | Status: DC | PRN

## 2015-11-16 MED ORDER — IBUPROFEN 600 MG PO TABS
600.0000 mg | ORAL_TABLET | Freq: Four times a day (QID) | ORAL | Status: DC
  Administered 2015-11-16 – 2015-11-18 (×10): 600 mg via ORAL
  Filled 2015-11-16 (×10): qty 1

## 2015-11-16 MED ORDER — COCONUT OIL OIL: 1.0000 "application " | TOPICAL_OIL | Status: DC | PRN

## 2015-11-16 MED ORDER — DIPHENHYDRAMINE HCL 25 MG PO CAPS: 25.0000 mg | ORAL_CAPSULE | Freq: Four times a day (QID) | ORAL | Status: DC | PRN

## 2015-11-16 MED ORDER — DIBUCAINE 1 % RE OINT
1.0000 "application " | TOPICAL_OINTMENT | RECTAL | Status: DC | PRN
  Filled 2015-11-16: qty 28

## 2015-11-16 MED ORDER — WITCH HAZEL-GLYCERIN EX PADS: 1.0000 "application " | MEDICATED_PAD | CUTANEOUS | Status: DC | PRN

## 2015-11-16 MED ORDER — PRENATAL MULTIVITAMIN CH
1.0000 | ORAL_TABLET | Freq: Every day | ORAL | Status: DC
  Administered 2015-11-16 – 2015-11-18 (×3): 1 via ORAL
  Filled 2015-11-16 (×3): qty 1

## 2015-11-16 MED ORDER — ONDANSETRON 8 MG PO TBDP
8.0000 mg | ORAL_TABLET | Freq: Once | ORAL | Status: AC
  Administered 2015-11-16: 8 mg via ORAL
  Filled 2015-11-16: qty 1

## 2015-11-16 MED ORDER — ONDANSETRON HCL 4 MG/2ML IJ SOLN: 4.0000 mg | INTRAMUSCULAR | Status: DC | PRN

## 2015-11-16 MED ORDER — SIMETHICONE 80 MG PO CHEW: 80.0000 mg | CHEWABLE_TABLET | ORAL | Status: DC | PRN

## 2015-11-16 NOTE — Lactation Note (Signed)
This note was copied from a baby's chart. Lactation Consultation Note Follow up visit at 20 hours of age.  Mom requested assist, she is concerned that baby is not getting enough and not latching well.  Baby is asleep with visitor, but parents report feeding cues and then attempt to latch and baby does not do well. LC encouraged parents as this is normal newborn behavior and also discussed cluster feedings.  Mom is using hand pump with few drops of colostrum noted.  Mom reports giving a few drops by spoon.  LC assisted with STS and football hold for latch.  Baby opens mouth wide flanged lips, holds breast with few sucks and stops.  Stimulation needed to maintain feeding for a few minutes with intermittent sucking.   Baby is then sleepy and content with STS on mom.  Basics reviewed and mom is feeling better to know this is normal newborn behavior.  LC encouraged spoon feeding as needed when baby is awake and to continue STS.  Family at bedside supportive.    Patient Name: [name] [name] Today's Date: 11/16/2015 Reason for consult: Follow-up assessment   Maternal Data Has patient been taught Hand Expression?: Yes Does the patient have breastfeeding experience prior to this delivery?: No  Feeding Feeding Type: Breast Fed Length of feed:  (few sucks)  LATCH Score/Interventions Latch: Repeated attempts needed to sustain latch, nipple held in mouth throughout feeding, stimulation needed to elicit sucking reflex. Intervention(s): Adjust position;Assist with latch;Breast massage;Breast compression  Audible Swallowing: None  Type of Nipple: Everted at rest and after stimulation (semi everted, compressible, mom uses hand pump to evert)  Comfort (Breast/Nipple): Soft / non-tender     Hold (Positioning): Assistance needed to correctly position infant at breast and maintain latch. Intervention(s): Breastfeeding basics reviewed;Support Pillows;Position options;Skin to skin  LATCH Score:  6  Lactation Tools Discussed/Used     Consult Status Consult Status: Follow-up Date: 11/16/15 Follow-up type: In-patient    [name] [name] 11/16/2015, 8:42 PM

## 2015-11-16 NOTE — Lactation Note (Addendum)
This note was copied from a baby's chart. Lactation Consultation Note  Patient Name: [name] [name] Today's Date: 11/16/2015 Reason for consult: Initial assessment (LC encouraged to page with feeding cues )  Baby is 14 hours and presently sleeping in cribs and not showing feeding cues.  LC explained infant's feeding behaviors and potential sluggishness in a 24 hours period of time.  Baby presently sleeping in the crib and not showing feeding cues.  LC recommended to mom to call with feeding cues and by 3:30 - 4 pm, check diaper, change if needed, and place STS. Mother informed of post-discharge support and given phone number to the lactation department, including services for phone call assistance;  out-patient appointments; and breastfeeding support group. List of other breastfeeding resources in the community given in the handout. Encouraged  mother to call for problems or concerns related to breastfeeding.   Maternal Data    Feeding Length of feed: 0 min  LATCH Score/Interventions                Intervention(s): Breastfeeding basics reviewed     Lactation Tools Discussed/Used     Consult Status Consult Status: Follow-up Date: 11/16/15 Follow-up type: In-patient    Myer Haff 11/16/2015, 2:43 PM

## 2015-11-17 NOTE — Plan of Care (Signed)
Problem: Nutritional: Goal: Mother's verbalization of comfort with breastfeeding process will improve Outcome: Progressing Pt denies nipple soreness or difficulties with breastfeeding. Pt reports baby cluster feeding.  Pt reassured that cluster feeding is normal.  Encouraged to call RN for latch.

## 2015-11-17 NOTE — Progress Notes (Signed)
Post Partum Day 1 Subjective: no complaints, voiding, ambulating. +Flatus  Objective: Blood pressure 119/69, pulse (!) 59, temperature 98.6 F (37 C), temperature source Oral, resp. rate 18, height 5' 0.5" (1.537 m), weight 148 lb (67.1 kg), last menstrual period 02/19/2015, unknown if currently breastfeeding.  Physical Exam:  General: alert and cooperative. MAD Lochia: appropriate Uterine Fundus: firm, nontender Incision: n/a DVT Evaluation: No evidence of DVT seen on physical exam. No cords or calf tenderness. No significant calf/ankle edema.   Recent Labs  11/15/15 1400 11/16/15 0809  HGB 11.4* 10.3*  HCT 33.2* 29.2*    Assessment/Plan: Plan for discharge tomorrow   LOS: 2 days   [name] [name] 11/17/2015, 5:37 PM    OB FELLOW POSTPARTUM PROGRESS NOTE ATTESTATION  I have seen and examined this patient and agree with above documentation in the resident's note.   Jacquiline Doe, MD 5:49 PM

## 2015-11-17 NOTE — Progress Notes (Signed)
Post Partum Day 1 Subjective: no complaints, up ad lib, voiding, tolerating PO and + flatus. Has seen lactation and is breastfeeding well, though some concerns because baby has been congested.  Objective: Blood pressure 118/81, pulse 61, temperature 97.8 F (36.6 C), temperature source Oral, resp. rate 18, height 5' 0.5" (1.537 m), weight 67.1 kg (148 lb), last menstrual period 02/19/2015, unknown if currently breastfeeding.  Physical Exam:  General: alert, cooperative and no distress Lochia: appropriate Uterine Fundus: firm DVT Evaluation: No evidence of DVT seen on physical exam.   Recent Labs  11/15/15 1400 11/16/15 0809  HGB 11.4* 10.3*  HCT 33.2* 29.2*    Assessment/Plan: Plan for discharge tomorrow and Breastfeeding. Blood pressures normotensive range for past 24 hours.  LOS: 2 days   Lorenza Evangelist 11/17/2015, 6:45 AM

## 2015-11-17 NOTE — Lactation Note (Signed)
This note was copied from a baby's chart. Lactation Consultation Note  Patient Name: [name] [name] Today's Date: 11/17/2015 Reason for consult: Follow-up assessment  Feeding frequency reviewed w/parents (infant had been sleeping/not fed in 6 hrs). Infant latched w/relative ease. Mom taught how to massage breasts during feeding to increase frequency of suckling/swallowing. Mom comfortable w/latch.   Parents were taught signs/sound of swallowing  Matthias Hughs [facility] 11/17/2015, 10:42 PM

## 2015-11-18 MED ORDER — IBUPROFEN 600 MG PO TABS: 600.0000 mg | ORAL_TABLET | Freq: Four times a day (QID) | ORAL | 1 refills | Status: DC | PRN

## 2015-11-18 NOTE — Lactation Note (Signed)
This note was copied from a baby's chart. Lactation Consultation Note  Patient Name: Boy [place] Dellarocco Today's Date: 11/18/2015 Reason for consult: Follow-up assessment   With this first time mom and term baby, now cluster feeding at 59 hours old. Mom latched baby deeply, and feels discomfort for the first few seconds, but then none after that. Mom denies any questions/concerns at this time, but knows to call lactation as needed.    Maternal Data    Feeding Feeding Type: Breast Fed  LATCH Score/Interventions Latch: Grasps breast easily, tongue down, lips flanged, rhythmical sucking.  Audible Swallowing: Spontaneous and intermittent Intervention(s): Skin to skin;Hand expression Intervention(s): Alternate breast massage  Type of Nipple: Everted at rest and after stimulation  Comfort (Breast/Nipple): Soft / non-tender     Hold (Positioning): Assistance needed to correctly position infant at breast and maintain latch. Intervention(s): Breastfeeding basics reviewed;Skin to skin  LATCH Score: 9  Lactation Tools Discussed/Used     Consult Status Consult Status: Complete Follow-up type: Call as needed    Tonna Corner 11/18/2015, 11:41 AM

## 2015-11-18 NOTE — Discharge Summary (Signed)
OB Discharge Summary     Patient Name: [name] [name] DOB: 29-Aug-1980 MRN: [id number]  Date of admission: 11/15/2015 Delivering MD: Edwena Blow ANN   Date of discharge: 11/18/2015  Admitting diagnosis: ctx Intrauterine pregnancy: [redacted]w[redacted]d     Secondary diagnosis:  Active Problems:   Gestational hypertension  Additional problems: none     Discharge diagnosis: Term Pregnancy Delivered                                                                           NSVD                     Post partum procedures:none  Augmentation: Cytotec  Complications: None  Hospital course:  Induction of Labor With Vaginal Delivery   35 y.o. yo [id number] at [redacted]w[redacted]d was admitted to the hospital 11/15/2015 for induction of labor.  Indication for induction: Gestational hypertension.  Patient had an uncomplicated labor course as follows: Membrane Rupture Time/Date: 9:20 PM ,11/15/2015   Intrapartum Procedures: Episiotomy: None [1]                                         Lacerations:  2nd degree [3]  Patient had delivery of a Viable infant.  Information for the patient's newborn:  Paterica, Errera [name] [id number]  Delivery Method: Vaginal, Spontaneous Delivery (Filed from Delivery Summary)   11/16/2015  Details of delivery can be found in separate delivery note.  Patient had a routine postpartum course. Patient is discharged home 11/18/15.   Physical exam  Vitals:   11/17/15 0625 11/17/15 1746 11/18/15 0514 11/18/15 0650  BP: 119/69 115/66 (!) 133/118 (!) 108/58  Pulse: (!) 59 (!) 55 (!) 54   Resp: 18 18 18    Temp: 98.6 F (37 C) 98.4 F (36.9 C) 97.8 F (36.6 C)   TempSrc: Oral Oral Oral   Weight:      Height:      . Vitals:   11/17/15 [id number] 11/17/15 1746 11/18/15 0514 11/18/15 0650  BP: 119/69 115/66 (!) 133/118 (!) 108/58  Pulse: (!) 59 (!) 55 (!) 54   Resp: 18 18 18    Temp: 98.6 F (37 C) 98.4 F (36.9 C) 97.8 F (36.6 C)   TempSrc: Oral Oral Oral   Weight:      Height:      BP at 0700:  108/58  General: alert, cooperative and no distress Lochia: appropriate Uterine Fundus: firm Incision: N/A DVT Evaluation: No evidence of DVT seen on physical exam. Labs: Lab Results  Component Value Date   WBC 15.9 (H) 11/16/2015   HGB 10.3 (L) 11/16/2015   HCT 29.2 (L) 11/16/2015   MCV 86.1 11/16/2015   PLT 156 11/16/2015   CMP Latest Ref Rng & Units 11/15/2015  Glucose 65 - 99 mg/dL 69  BUN 6 - 20 mg/dL 8  Creatinine 0.44 - 1.00 mg/dL 0.77  Sodium 135 - 145 mmol/L 135  Potassium 3.5 - 5.1 mmol/L 3.7  Chloride 101 - 111 mmol/L 104  CO2 22 - 32 mmol/L 25  Calcium 8.9 - 10.3 mg/dL 9.2  Total Protein  6.5 - 8.1 g/dL 6.7  Total Bilirubin 0.3 - 1.2 mg/dL 0.6  Alkaline Phos 38 - 126 U/L 89  AST 15 - 41 U/L 22  ALT 14 - 54 U/L 13(L)    Discharge instruction: per After Visit Summary and "Baby and Me Booklet".  After visit meds:    Medication List    STOP taking these medications   acetaminophen 500 MG tablet Commonly known as:  TYLENOL   calcium carbonate 750 MG chewable tablet Commonly known as:  TUMS EX     TAKE these medications   ibuprofen 600 MG tablet Commonly known as:  ADVIL,MOTRIN Take 1 tablet (600 mg total) by mouth every 6 (six) hours as needed.   multivitamin-prenatal 27-0.8 MG Tabs tablet Take 1 tablet by mouth daily at 12 noon.       Diet: routine diet  Activity: Advance as tolerated. Pelvic rest for 6 weeks.   Outpatient follow up: 4 days HHN for BP check; 4 wks for PP visit Follow up Appt:Future Appointments Date Time Provider [place]  01/16/2016 11:00 AM Osborne Oman, MD [facility]   Follow up Visit:No Follow-up on file.  Postpartum contraception: Condoms  Newborn Data: Live born female  Birth Weight: 6 lb 8.1 oz (2950 g) APGAR: 8, 9  Baby Feeding: Breast Disposition:home with mother   11/18/2015 Alilah Mcmeans, CNM

## 2015-11-18 NOTE — Discharge Instructions (Signed)

## 2015-11-21 ENCOUNTER — Encounter: Payer: Medicaid Other | Admitting: Family Medicine

## 2015-11-22 ENCOUNTER — Encounter: Payer: Medicaid Other | Admitting: Family Medicine

## 2015-11-23 ENCOUNTER — Encounter: Payer: Medicaid Other | Admitting: Advanced Practice Midwife

## 2015-11-24 ENCOUNTER — Other Ambulatory Visit (INDEPENDENT_AMBULATORY_CARE_PROVIDER_SITE_OTHER): Payer: Medicaid Other | Admitting: *Deleted

## 2015-11-24 DIAGNOSIS — R300 Dysuria: Secondary | ICD-10-CM | POA: Diagnosis not present

## 2015-11-24 LAB — POCT URINALYSIS DIPSTICK
Bilirubin, UA: NEGATIVE
GLUCOSE UA: NEGATIVE
Ketones, UA: NEGATIVE
Nitrite, UA: NEGATIVE
PH UA: 6
SPEC GRAV UA: 1.02
UROBILINOGEN UA: 0.2

## 2015-11-24 MED ORDER — CIPROFLOXACIN HCL 500 MG PO TABS: 500.0000 mg | ORAL_TABLET | Freq: Two times a day (BID) | ORAL | 0 refills | Status: DC

## 2015-11-24 NOTE — Progress Notes (Signed)
Pt came in office c/o dysuria since last, is 7 day postpartum and currently breastfeeding.  UA + leukocytes and blood, will send urine cx.  Called in Cipro to pharmacy, pt has allergy to Bactrim.  Unable to send in Pyridium due to contraindication with breastfeeding.  Encouraged to push fluids and empty bladder often.

## 2015-11-27 ENCOUNTER — Encounter: Payer: Medicaid Other | Admitting: Obstetrics & Gynecology

## 2015-11-27 LAB — URINE CULTURE: Organism ID, Bacteria: 10000

## 2015-12-07 ENCOUNTER — Ambulatory Visit
Admission: RE | Admit: 2015-12-07 | Discharge: 2015-12-07 | Disposition: A | Discharge status: Home / Self Care | Payer: Medicaid Other | Source: Ambulatory Visit | Attending: Pediatrics | Admitting: Pediatrics

## 2015-12-07 NOTE — Lactation Note (Signed)
Lactation Consultation Note  Patient Name: [name] [name] Today's Date: 12/07/2015     Maternal Data  Mother has been complaining about infant "biting" down during latching and it is very painful. Mom said nipples were cracked and bleeding a few weeks ago, but she has learned to accommodate with expressing her milk and bottle feedings. However, she would like to continue nursing baby.   Feeding  "Harrell Gave" is 75wks old and has had no wt gain issues, just latching is causing pain for mom.   LATCH Score/Interventions  Harrell Gave was circumcised today so he was a tad sleepy, but did well eating with his onesie on. Mostly comfort nursing. He took in 52mL from mom's left breast and 63mL from mom's right breast. Oral exam shows that baby has an overbite which is causing his chop-bite reflex during the initial latch-on.                     Lactation Tools Discussed/Used  Nipple shields and comfort hydrogels   Consult Status  PRN Mom is to continue using nipple shield in order to train baby to keep tongue down during feedings as well as continue with suck-training. Growth spurts will help with him latching on better w/o biting down on mom with top gums from overbite.     Marnee Spring 12/07/2015, 2:57 PM

## 2016-01-16 ENCOUNTER — Encounter: Payer: Self-pay | Admitting: Obstetrics & Gynecology

## 2016-01-16 ENCOUNTER — Ambulatory Visit (INDEPENDENT_AMBULATORY_CARE_PROVIDER_SITE_OTHER): Payer: Medicaid Other | Admitting: Obstetrics & Gynecology

## 2016-01-16 NOTE — Progress Notes (Signed)
Post Partum Exam  [name] [name] is a 35 y.o. G56P1021 female who presents for a postpartum visit. She is 8 weeks postpartum following a spontaneous vaginal delivery. I have fully reviewed the prenatal and intrapartum course. The delivery was at 38w 4d gestational weeks.  Anesthesia: none. Postpartum course has been unremarkable. Baby's course has been unremarkable. Baby is feeding by breast. Bleeding no bleeding. Bowel function is normal. Bladder function is normal. Patient is not sexually active. Contraception method is none. Postpartum depression screening:neg  The following portions of the patient's history were reviewed and updated as appropriate: allergies, current medications, past family history, past medical history, past social history, past surgical history and problem list.l  Review of Systems Pertinent items noted in HPI and remainder of comprehensive ROS otherwise negative.  Normal pap and negative HRHPV on 05/02/2015.  Objective:    BP 116/78 mmHg  Pulse 78  Resp 16  Ht 5\' 5"  (1.651 m)  Wt 211 lb (95.709 kg)  BMI 35.11 kg/m2  Breastfeeding? Yes  General:  alert and no distress   Breasts:  inspection negative, no nipple discharge or bleeding, no masses or nodularity palpable  Lungs: clear to auscultation bilaterally  Heart:  regular rate and rhythm  Abdomen: soft, non-tender; bowel sounds normal; no masses,  no organomegaly   Pelvic:  not evaluated        Assessment:  Normal postpartum exam.   Plan:   1. Contraception: none for now.  2. Follow up as needed.     Verita Schneiders, MD, [place] [facility] [place], [facility] for [employer], [place]

## 2016-01-16 NOTE — Patient Instructions (Signed)
Return to clinic for any scheduled appointments or obstetric concerns, or go to [facility] for evaluation  

## 2016-01-31 ENCOUNTER — Telehealth: Payer: Self-pay | Admitting: Obstetrics & Gynecology

## 2016-01-31 DIAGNOSIS — N61 Mastitis without abscess: Secondary | ICD-10-CM

## 2016-01-31 MED ORDER — CLINDAMYCIN HCL 300 MG PO CAPS: 300.0000 mg | ORAL_CAPSULE | Freq: Three times a day (TID) | ORAL | 0 refills | Status: DC

## 2016-01-31 NOTE — Telephone Encounter (Signed)
Pt has some questions. She is 10wks post partum and she thinks she has a yeast infection in her breast. Baby was taken to the doctor and they saw no signs of yeast, but mom has horrible stabbing pains in the breast. Patient was wondering if she has to come in for an office visit or can she use anti fungal cream for seven days. Pt asks if there was any way she could get some advice over the phone since she doesn't have insurance currently. Please advise

## 2016-01-31 NOTE — Telephone Encounter (Signed)
Called pt to go over Sx. She describes breast problem as constant shooting pain that increases when breastfeeding. She states "it feels like baby is sucking glass through my nipples". Spoke with Dr. Ernestina Patches and advised pt possible mastitis v/s yeast but unable to diagnose over the phone. I will send in Clindamycin for her to take the next 7 days and if no better then she would need to come in for eval. Pt expressed understanding.

## 2016-02-14 ENCOUNTER — Ambulatory Visit (INDEPENDENT_AMBULATORY_CARE_PROVIDER_SITE_OTHER): Payer: Medicaid Other | Admitting: Obstetrics and Gynecology

## 2016-02-14 VITALS — BP 138/74 | HR 60 | Wt 141.0 lb

## 2016-02-14 DIAGNOSIS — N61 Mastitis without abscess: Secondary | ICD-10-CM

## 2016-02-14 MED ORDER — MICONAZOLE NITRATE 2 % EX OINT: TOPICAL_OINTMENT | CUTANEOUS | 1 refills | Status: DC

## 2016-02-14 MED ORDER — FLUCONAZOLE 150 MG PO TABS: 150.0000 mg | ORAL_TABLET | Freq: Once | ORAL | 1 refills | Status: AC

## 2016-02-14 NOTE — Progress Notes (Signed)
35 yo presenting today for the evaluation of shooting breast pain. Patient states that she has experienced some lactation hurdles which she overcame but over the past 2 weeks she has experienced shooting breast pain and often feels that there is glass in her breast. Breastfeeding intensifies the pain. She took 7 days of clinda without improvement in her symptoms. The pediatrician do not think that her son has thrush  Past Medical History:  Diagnosis Date  . Headache   . Hypertension    Past Surgical History:  Procedure Laterality Date  . LEG SURGERY Left 1999   steel rod in left femur bone    Family History  Problem Relation Age of Onset  . Hyperlipidemia Mother   . Heart disease Maternal Aunt   . Heart disease Maternal Uncle   . Heart disease Paternal Aunt   . Heart disease Maternal Grandfather   . Heart disease Paternal Grandfather    Social History  Substance Use Topics  . Smoking status: Never Smoker  . Smokeless tobacco: Never Used  . Alcohol use Yes     Comment: wine    ROS See pertinent in HPI  Blood pressure 138/74, pulse 60, weight 141 lb (64 kg), currently breastfeeding.;  GENERAL: Well-developed, well-nourished female in no acute distress.  BREASTS: Symmetric in size. No palpable masses or lymphadenopathy, skin changes, or nipple drainage. Soft. No signs of engorgement or palpable abscess ABDOMEN: Soft, nontender, nondistended. No organomegaly.  A/P 35 yo with mastitis likely of yeast origin - Rx diflucan and miconazole provided - RTC if symptoms persists by Friday

## 2016-05-10 ENCOUNTER — Encounter (HOSPITAL_COMMUNITY): Payer: Self-pay | Admitting: Emergency Medicine

## 2016-05-10 ENCOUNTER — Ambulatory Visit (HOSPITAL_COMMUNITY)
Admission: EM | Admit: 2016-05-10 | Discharge: 2016-05-10 | Disposition: A | Discharge status: Home / Self Care | Payer: Medicaid Other | Attending: Family Medicine | Admitting: Family Medicine

## 2016-05-10 DIAGNOSIS — G43009 Migraine without aura, not intractable, without status migrainosus: Secondary | ICD-10-CM

## 2016-05-10 MED ORDER — SUMATRIPTAN SUCCINATE 6 MG/0.5ML ~~LOC~~ SOLN
SUBCUTANEOUS | Status: AC
  Filled 2016-05-10: qty 0.5

## 2016-05-10 MED ORDER — DEXAMETHASONE SODIUM PHOSPHATE 10 MG/ML IJ SOLN
10.0000 mg | Freq: Once | INTRAMUSCULAR | Status: AC
  Administered 2016-05-10: 10 mg via INTRAMUSCULAR

## 2016-05-10 MED ORDER — SUMATRIPTAN SUCCINATE 50 MG PO TABS: ORAL_TABLET | ORAL | 0 refills | Status: DC

## 2016-05-10 MED ORDER — KETOROLAC TROMETHAMINE 30 MG/ML IJ SOLN
INTRAMUSCULAR | Status: AC
  Filled 2016-05-10: qty 1

## 2016-05-10 MED ORDER — ONDANSETRON HCL 4 MG/2ML IJ SOLN
4.0000 mg | Freq: Once | INTRAMUSCULAR | Status: AC
  Administered 2016-05-10: 4 mg via INTRAVENOUS

## 2016-05-10 MED ORDER — SUMATRIPTAN SUCCINATE 6 MG/0.5ML ~~LOC~~ SOLN
6.0000 mg | Freq: Once | SUBCUTANEOUS | Status: AC
  Administered 2016-05-10: 6 mg via SUBCUTANEOUS

## 2016-05-10 MED ORDER — ONDANSETRON 4 MG PO TBDP: 4.0000 mg | ORAL_TABLET | Freq: Three times a day (TID) | ORAL | 0 refills | Status: DC | PRN

## 2016-05-10 MED ORDER — KETOROLAC TROMETHAMINE 60 MG/2ML IM SOLN
30.0000 mg | Freq: Once | INTRAMUSCULAR | Status: AC
  Administered 2016-05-10: 30 mg via INTRAMUSCULAR

## 2016-05-10 MED ORDER — ONDANSETRON HCL 4 MG/2ML IJ SOLN: 4.0000 mg | Freq: Once | INTRAMUSCULAR | Status: DC

## 2016-05-10 MED ORDER — DEXAMETHASONE SODIUM PHOSPHATE 10 MG/ML IJ SOLN
INTRAMUSCULAR | Status: AC
  Filled 2016-05-10: qty 1

## 2016-05-10 MED ORDER — ONDANSETRON HCL 4 MG/2ML IJ SOLN
INTRAMUSCULAR | Status: AC
  Filled 2016-05-10: qty 2

## 2016-05-10 NOTE — Discharge Instructions (Addendum)
You have been treated for migraine headache in clinic today. You've had an IV, 1 L of normal saline, dexamethasone, Toradol, Zofran, and sumatriptan. I have prescribed a medicine for nausea called Zofran take 1 tablet under the tongue every 8 hours as needed for nausea. I have also prescribed Imitrex take 1 tablet at start of headache then second tablet tablet within 2 hours if needed. Do not take more than 2 tablets in any 24-hour period. I would recommend you follow-up with primary care for further management of your headaches.

## 2016-05-10 NOTE — ED Triage Notes (Signed)
Here for constant HA onset this am.... Reports she woke up fine and then the HA started to progress  Sx also include: vomiting, nauseas  HA increases w/bright light and loud noise  Has had this sx in the past before  Pain is 10/10... Pt has head down and covered and is crying due to pain  Had 600 mg of ibup and 15 min later she vomited.   Husband at bedside... A&O x4... NAD

## 2016-05-10 NOTE — ED Provider Notes (Signed)
CSN: [id number]     Arrival date & time 05/10/16  1404 History   First MD Initiated Contact with Patient 05/10/16 1429     Chief Complaint  Patient presents with  . Headache   (Consider location/radiation/quality/duration/timing/severity/associated sxs/prior Treatment) 36 year old female presents to clinic with a chief complaint of headache. States she woke up this morning having a headache, worse on the right side, and towards the front of her head, she describes the pain as a sharp throbbing sensation. She has nausea, and vomiting, she does have past history of migraines, states this is consistent with previous migraines, she states her headache has gradually been getting worse throughout the day, she reports she took 600 mg of ibuprofen at home, however vomited the medication back up to 15 minutes later. She denies any neck pain, she is able to move her neck without difficulty, she is light sensitive, and noise sensitive.   The history is provided by the patient.    Past Medical History:  Diagnosis Date  . Headache   . Hypertension    Past Surgical History:  Procedure Laterality Date  . LEG SURGERY Left 1999   steel rod in left femur bone    Family History  Problem Relation Age of Onset  . Hyperlipidemia Mother   . Heart disease Maternal Aunt   . Heart disease Maternal Uncle   . Heart disease Paternal Aunt   . Heart disease Maternal Grandfather   . Heart disease Paternal Grandfather    Social History  Substance Use Topics  . Smoking status: Never Smoker  . Smokeless tobacco: Never Used  . Alcohol use Yes     Comment: wine    OB History    Gravida Para Term Preterm AB Living   3 1 1   2 1    SAB TAB Ectopic Multiple Live Births   2     0 1     Review of Systems  Reason unable to perform ROS: As covered in history of present illness.  All other systems reviewed and are negative.   Allergies  Bactrim [sulfamethoxazole-trimethoprim] and Penicillins  Home  Medications   Prior to Admission medications   Medication Sig Start Date End Date Taking? Authorizing Provider  Prenatal Vit-Fe Fumarate-FA (MULTIVITAMIN-PRENATAL) 27-0.8 MG TABS tablet Take 1 tablet by mouth daily at 12 noon.   Yes Historical Provider, MD  ondansetron (ZOFRAN ODT) 4 MG disintegrating tablet Take 1 tablet (4 mg total) by mouth every 8 (eight) hours as needed for nausea or vomiting. 05/10/16   Barnet Glasgow, NP  SUMAtriptan (IMITREX) 50 MG tablet May take 1 tablet at start of headache, may take a second within 2 hours. Do not take more than 2 tablets cinnamon 24-hour period 05/10/16   Barnet Glasgow, NP   Meds Ordered and Administered this Visit   Medications  ketorolac (TORADOL) injection 30 mg (30 mg Intramuscular Given 05/10/16 1518)  dexamethasone (DECADRON) injection 10 mg (10 mg Intramuscular Given 05/10/16 1519)  SUMAtriptan (IMITREX) injection 6 mg (6 mg Subcutaneous Given 05/10/16 1519)  ondansetron (ZOFRAN) injection 4 mg (4 mg Intravenous Given 05/10/16 1519)    BP 149/95   Pulse 78   Temp 97.8 F (36.6 C) (Oral)   Resp 18   SpO2 100%   Breastfeeding? Yes  No data found.   Physical Exam  Constitutional: She is oriented to person, place, and time. She appears well-developed and well-nourished. She appears distressed.  HENT:  Head: Normocephalic and atraumatic.  Right  Ear: External ear normal.  Left Ear: External ear normal.  Eyes: Conjunctivae and EOM are normal. Pupils are equal, round, and reactive to light.  Neck: Normal range of motion. Neck supple. No JVD present.  Cardiovascular: Normal rate and regular rhythm.   Pulmonary/Chest: Effort normal and breath sounds normal. No respiratory distress. She has no wheezes.  Abdominal: Soft. Bowel sounds are normal. There is no tenderness.  Musculoskeletal: She exhibits no edema.  Lymphadenopathy:    She has no cervical adenopathy.  Neurological: She is alert and oriented to person, place, and time.   Skin: Skin is warm. Capillary refill takes less than 2 seconds. She is diaphoretic.  Psychiatric: She has a normal mood and affect.  Nursing note and vitals reviewed.   Urgent Care Course     Procedures (including critical care time)  Labs Review Labs Reviewed - No data to display  Imaging Review No results found.   Visual Acuity Review  Right Eye Distance:   Left Eye Distance:   Bilateral Distance:    Right Eye Near:   Left Eye Near:    Bilateral Near:         MDM   1. Migraine without aura and without status migrainosus, not intractable    You have been treated for migraine headache in clinic today. You've had an IV, 1 L of normal saline, dexamethasone, Toradol, Zofran, and sumatriptan. I have prescribed a medicine for nausea called Zofran take 1 tablet under the tongue every 8 hours as needed for nausea. I have also prescribed Imitrex take 1 tablet at start of headache then second tablet tablet within 2 hours if needed. Do not take more than 2 tablets in any 24-hour period. I would recommend you follow-up with primary care for further management of your headaches.      Barnet Glasgow, NP 05/10/16 [phone]

## 2016-05-22 ENCOUNTER — Encounter (INDEPENDENT_AMBULATORY_CARE_PROVIDER_SITE_OTHER): Payer: Self-pay | Admitting: Physician Assistant

## 2016-05-22 ENCOUNTER — Ambulatory Visit (INDEPENDENT_AMBULATORY_CARE_PROVIDER_SITE_OTHER): Payer: Medicaid Other | Admitting: Physician Assistant

## 2016-05-22 VITALS — BP 123/79 | HR 60 | Temp 98.1°F | Ht 63.0 in | Wt 150.0 lb

## 2016-05-22 DIAGNOSIS — F329 Major depressive disorder, single episode, unspecified: Secondary | ICD-10-CM

## 2016-05-22 DIAGNOSIS — Z79899 Other long term (current) drug therapy: Secondary | ICD-10-CM | POA: Diagnosis not present

## 2016-05-22 DIAGNOSIS — F418 Other specified anxiety disorders: Secondary | ICD-10-CM

## 2016-05-22 DIAGNOSIS — F419 Anxiety disorder, unspecified: Secondary | ICD-10-CM

## 2016-05-22 DIAGNOSIS — G43901 Migraine, unspecified, not intractable, with status migrainosus: Secondary | ICD-10-CM | POA: Diagnosis not present

## 2016-05-22 LAB — POCT URINE PREGNANCY: PREG TEST UR: NEGATIVE

## 2016-05-22 MED ORDER — AMITRIPTYLINE HCL 10 MG PO TABS: 10.0000 mg | ORAL_TABLET | Freq: Every day | ORAL | 2 refills | Status: DC
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
## 2016-05-22 NOTE — Patient Instructions (Addendum)
You may want to consider psychological counseling at your usual psychologist's office. You have been started on Amitriptyline for Migraine headache but it can also be used for depression. Amitriptyline's lactation profile is "probably safe".  Migraine Headache A migraine headache is an intense, throbbing pain on one side or both sides of the head. Migraines may also cause other symptoms, such as nausea, vomiting, and sensitivity to light and noise. What are the causes? Doing or taking certain things may also trigger migraines, such as:  Alcohol.  Smoking.  Medicines, such as:  Medicine used to treat chest pain (nitroglycerine).  Birth control pills.  Estrogen pills.  Certain blood pressure medicines.  Aged cheeses, chocolate, or caffeine.  Foods or drinks that contain nitrates, glutamate, aspartame, or tyramine.  Physical activity. Other things that may trigger a migraine include:  Menstruation.  Pregnancy.  Hunger.  Stress, lack of sleep, too much sleep, or fatigue.  Weather changes. What increases the risk? The following factors may make you more likely to experience migraine headaches:  Age. Risk increases with age.  Family history of migraine headaches.  Being Caucasian.  Depression and anxiety.  Obesity.  Being a woman.  Having a hole in the heart (patent foramen ovale) or other heart problems. What are the signs or symptoms? The main symptom of this condition is pulsating or throbbing pain. Pain may:  Happen in any area of the head, such as on one side or both sides.  Interfere with daily activities.  Get worse with physical activity.  Get worse with exposure to bright lights or loud noises. Other symptoms may include:  Nausea.  Vomiting.  Dizziness.  General sensitivity to bright lights, loud noises, or smells. Before you get a migraine, you may get warning signs that a migraine is developing (aura). An aura may include:  Seeing  flashing lights or having blind spots.  Seeing bright spots, halos, or zigzag lines.  Having tunnel vision or blurred vision.  Having numbness or a tingling feeling.  Having trouble talking.  Having muscle weakness. How is this diagnosed? A migraine headache can be diagnosed based on:  Your symptoms.  A physical exam.  Tests, such as CT scan or MRI of the head. These imaging tests can help rule out other causes of headaches.  Taking fluid from the spine (lumbar puncture) and analyzing it (cerebrospinal fluid analysis, or CSF analysis). How is this treated? A migraine headache is usually treated with medicines that:  Relieve pain.  Relieve nausea.  Prevent migraines from coming back. Treatment may also include:  Acupuncture.  Lifestyle changes like avoiding foods that trigger migraines. Follow these instructions at home: Medicines   Take over-the-counter and prescription medicines only as told by your health care provider.  Do not drive or use heavy machinery while taking prescription pain medicine.  To prevent or treat constipation while you are taking prescription pain medicine, your health care provider may recommend that you:  Drink enough fluid to keep your urine clear or pale yellow.  Take over-the-counter or prescription medicines.  Eat foods that are high in fiber, such as fresh fruits and vegetables, whole grains, and beans.  Limit foods that are high in fat and processed sugars, such as fried and sweet foods. Lifestyle   Avoid alcohol use.  Do not use any products that contain nicotine or tobacco, such as cigarettes and e-cigarettes. If you need help quitting, ask your health care provider.  Get at least 8 hours of sleep every night.  Limit your stress. General instructions    Keep a journal to find out what may trigger your migraine headaches. For example, write down:  What you eat and drink.  How much sleep you get.  Any change to your  diet or medicines.  If you have a migraine:  Avoid things that make your symptoms worse, such as bright lights.  It may help to lie down in a dark, quiet room.  Do not drive or use heavy machinery.  Ask your health care provider what activities are safe for you while you are experiencing symptoms.  Keep all follow-up visits as told by your health care provider. This is important. Contact a health care provider if:  You develop symptoms that are different or more severe than your usual migraine symptoms. Get help right away if:  Your migraine becomes severe.  You have a fever.  You have a stiff neck.  You have vision loss.  Your muscles feel weak or like you cannot control them.  You start to lose your balance often.  You develop trouble walking.  You faint. This information is not intended to replace advice given to you by your health care provider. Make sure you discuss any questions you have with your health care provider. Document Released: 03/04/2005 Document Revised: 09/22/2015 Document Reviewed: 08/21/2015 Elsevier Interactive Patient Education  2017 [employer].

## 2016-05-23 LAB — CBC WITH DIFFERENTIAL/PLATELET
Basophils Absolute: 0 10*3/uL (ref 0.0–0.2)
Basos: 1 %
EOS (ABSOLUTE): 0.1 10*3/uL (ref 0.0–0.4)
Eos: 1 %
HEMOGLOBIN: 12.5 g/dL (ref 11.1–15.9)
Hematocrit: 36.6 % (ref 34.0–46.6)
IMMATURE GRANS (ABS): 0 10*3/uL (ref 0.0–0.1)
IMMATURE GRANULOCYTES: 0 %
LYMPHS: 33 %
Lymphocytes Absolute: 1.4 10*3/uL (ref 0.7–3.1)
MCH: 29.8 pg (ref 26.6–33.0)
MCHC: 34.2 g/dL (ref 31.5–35.7)
MCV: 87 fL (ref 79–97)
MONOCYTES: 6 %
Monocytes Absolute: 0.3 10*3/uL (ref 0.1–0.9)
NEUTROS ABS: 2.5 10*3/uL (ref 1.4–7.0)
NEUTROS PCT: 59 %
PLATELETS: 279 10*3/uL (ref 150–379)
RBC: 4.2 x10E6/uL (ref 3.77–5.28)
RDW: 13.3 % (ref 12.3–15.4)
WBC: 4.2 10*3/uL (ref 3.4–10.8)

## 2016-05-23 LAB — COMPREHENSIVE METABOLIC PANEL
ALBUMIN: 4.6 g/dL (ref 3.5–5.5)
ALT: 15 IU/L (ref 0–32)
AST: 20 IU/L (ref 0–40)
Albumin/Globulin Ratio: 1.8 (ref 1.2–2.2)
Alkaline Phosphatase: 66 IU/L (ref 39–117)
BILIRUBIN TOTAL: 0.3 mg/dL (ref 0.0–1.2)
BUN/Creatinine Ratio: 21 (ref 9–23)
BUN: 16 mg/dL (ref 6–20)
CALCIUM: 9.5 mg/dL (ref 8.7–10.2)
CO2: 24 mmol/L (ref 18–29)
CREATININE: 0.75 mg/dL (ref 0.57–1.00)
Chloride: 102 mmol/L (ref 96–106)
GFR calc Af Amer: 119 mL/min/{1.73_m2} (ref >59)
GFR, EST NON AFRICAN AMERICAN: 104 mL/min/{1.73_m2} (ref >59)
GLUCOSE: 77 mg/dL (ref 65–99)
Globulin, Total: 2.6 g/dL (ref 1.5–4.5)
Potassium: 4.5 mmol/L (ref 3.5–5.2)
Sodium: 142 mmol/L (ref 134–144)
Total Protein: 7.2 g/dL (ref 6.0–8.5)

## 2016-05-23 LAB — TSH: TSH: 1.2 u[IU]/mL (ref 0.450–4.500)

## 2016-05-24 ENCOUNTER — Encounter (INDEPENDENT_AMBULATORY_CARE_PROVIDER_SITE_OTHER): Payer: Self-pay

## 2016-06-19 ENCOUNTER — Ambulatory Visit (INDEPENDENT_AMBULATORY_CARE_PROVIDER_SITE_OTHER): Payer: Medicaid Other | Admitting: Physician Assistant

## 2016-06-19 ENCOUNTER — Encounter (INDEPENDENT_AMBULATORY_CARE_PROVIDER_SITE_OTHER): Payer: Self-pay | Admitting: Physician Assistant

## 2016-06-19 VITALS — BP 156/102 | HR 71 | Temp 98.1°F | Ht 63.0 in | Wt 149.2 lb

## 2016-06-19 DIAGNOSIS — G43809 Other migraine, not intractable, without status migrainosus: Secondary | ICD-10-CM

## 2016-06-19 MED ORDER — AMITRIPTYLINE HCL 25 MG PO TABS: 25.0000 mg | ORAL_TABLET | Freq: Every day | ORAL | 2 refills | Status: DC

## 2016-06-19 MED ORDER — SUMATRIPTAN SUCCINATE 50 MG PO TABS: ORAL_TABLET | ORAL | 2 refills | Status: DC

## 2016-06-19 NOTE — Progress Notes (Signed)
Subjective:  Patient ID: [name] [name], female    DOB: [birth year]  Age: 36 y.o. MRN: [id number]  CC: f/u migraines  HPI [name] Kamp is a 36 y.o. female with a PMH of migraine headaches returns on f/u of migraines. She had 2-3 migraines per week and now reports 3 migraines over the last month. Has been taking Amitriptyline 10mg  qhs as directed. Occasional Sumatriptan 50mg  use which is effective if she has a headache. Denies any other complaints and is overall happy with the treatment thus far.    Review of Systems  Constitutional: Negative for chills, fever and malaise/fatigue.  Eyes: Negative for blurred vision.  Respiratory: Negative for shortness of breath.   Cardiovascular: Negative for chest pain and palpitations.  Gastrointestinal: Negative for abdominal pain and nausea.  Genitourinary: Negative for dysuria and hematuria.  Musculoskeletal: Negative for joint pain and myalgias.  Skin: Negative for rash.  Neurological: Positive for headaches. Negative for tingling.  Psychiatric/Behavioral: Negative for depression. The patient is not nervous/anxious.     Objective:  BP (!) 156/102 (BP Location: Left Arm, Patient Position: Sitting, Cuff Size: Normal)   Pulse 71   Temp 98.1 F (36.7 C) (Oral)   Ht 5\' 3"  (1.6 m)   Wt 149 lb 3.2 oz (67.7 kg)   LMP 02/02/2016   SpO2 94%   Breastfeeding? Yes Comment: has not had a cycle since before getiting pregnant   BMI 26.43 kg/m   BP/Weight 06/19/2016 05/22/2016 0/53/9767  Systolic BP 341 937 902  Diastolic BP 409 79 95  Wt. (Lbs) 149.2 150 -  BMI 26.43 26.57 -      Physical Exam  Constitutional: She is oriented to person, place, and time.  Well developed, overweight, NAD, polite  HENT:  Head: Normocephalic and atraumatic.  Eyes: No scleral icterus.  Cardiovascular: Normal rate, regular rhythm and normal heart sounds.   Pulmonary/Chest: Effort normal and breath sounds normal.  Musculoskeletal: She exhibits no edema.   Neurological: She is alert and oriented to person, place, and time. No cranial nerve deficit. Coordination normal.  Skin: Skin is warm and dry. No rash noted. No erythema. No pallor.  Psychiatric: She has a normal mood and affect. Her behavior is normal. Thought content normal.  Vitals reviewed.    Assessment & Plan:   1. Other migraine without status migrainosus, not intractable - SUMAtriptan (IMITREX) 50 MG tablet; May take 1 tablet at start of headache, may take a second within 2 hours. Do not take more than 2 tablets cinnamon 24-hour period  Dispense: 10 tablet; Refill: 2 - Increase to amitriptyline (ELAVIL) 25 MG tablet; Take 1 tablet (25 mg total) by mouth at bedtime.  Dispense: 30 tablet; Refill: 2   Meds ordered this encounter  Medications  . SUMAtriptan (IMITREX) 50 MG tablet    Sig: May take 1 tablet at start of headache, may take a second within 2 hours. Do not take more than 2 tablets cinnamon 24-hour period    Dispense:  10 tablet    Refill:  2    Order Specific Question:   Supervising Provider    Answer:   Tresa Garter [id number]  . amitriptyline (ELAVIL) 25 MG tablet    Sig: Take 1 tablet (25 mg total) by mouth at bedtime.    Dispense:  30 tablet    Refill:  2    Order Specific Question:   Supervising Provider    Answer:   Tresa Garter [id number]    Follow-up:  Return if symptoms worsen or fail to improve.   Clent Demark PA

## 2016-06-19 NOTE — Progress Notes (Signed)
Pt presents for a F/U for migraines  Pt states she is still having migraines.. She has had three since her last visit

## 2016-06-19 NOTE — Patient Instructions (Addendum)
Migraine Headache A migraine headache is an intense, throbbing pain on one side or both sides of the head. Migraines may also cause other symptoms, such as nausea, vomiting, and sensitivity to light and noise. What are the causes? Doing or taking certain things may also trigger migraines, such as:  Alcohol.  Smoking.  Medicines, such as: ? Medicine used to treat chest pain (nitroglycerine). ? Birth control pills. ? Estrogen pills. ? Certain blood pressure medicines.  Aged cheeses, chocolate, or caffeine.  Foods or drinks that contain nitrates, glutamate, aspartame, or tyramine.  Physical activity.  Other things that may trigger a migraine include:  Menstruation.  Pregnancy.  Hunger.  Stress, lack of sleep, too much sleep, or fatigue.  Weather changes.  What increases the risk? The following factors may make you more likely to experience migraine headaches:  Age. Risk increases with age.  Family history of migraine headaches.  Being Caucasian.  Depression and anxiety.  Obesity.  Being a woman.  Having a hole in the heart (patent foramen ovale) or other heart problems.  What are the signs or symptoms? The main symptom of this condition is pulsating or throbbing pain. Pain may:  Happen in any area of the head, such as on one side or both sides.  Interfere with daily activities.  Get worse with physical activity.  Get worse with exposure to bright lights or loud noises.  Other symptoms may include:  Nausea.  Vomiting.  Dizziness.  General sensitivity to bright lights, loud noises, or smells.  Before you get a migraine, you may get warning signs that a migraine is developing (aura). An aura may include:  Seeing flashing lights or having blind spots.  Seeing bright spots, halos, or zigzag lines.  Having tunnel vision or blurred vision.  Having numbness or a tingling feeling.  Having trouble talking.  Having muscle weakness.  How is this  diagnosed? A migraine headache can be diagnosed based on:  Your symptoms.  A physical exam.  Tests, such as CT scan or MRI of the head. These imaging tests can help rule out other causes of headaches.  Taking fluid from the spine (lumbar puncture) and analyzing it (cerebrospinal fluid analysis, or CSF analysis).  How is this treated? A migraine headache is usually treated with medicines that:  Relieve pain.  Relieve nausea.  Prevent migraines from coming back.  Treatment may also include:  Acupuncture.  Lifestyle changes like avoiding foods that trigger migraines.  Follow these instructions at home: Medicines  Take over-the-counter and prescription medicines only as told by your health care provider.  Do not drive or use heavy machinery while taking prescription pain medicine.  To prevent or treat constipation while you are taking prescription pain medicine, your health care provider may recommend that you: ? Drink enough fluid to keep your urine clear or pale yellow. ? Take over-the-counter or prescription medicines. ? Eat foods that are high in fiber, such as fresh fruits and vegetables, whole grains, and beans. ? Limit foods that are high in fat and processed sugars, such as fried and sweet foods. Lifestyle  Avoid alcohol use.  Do not use any products that contain nicotine or tobacco, such as cigarettes and e-cigarettes. If you need help quitting, ask your health care provider.  Get at least 8 hours of sleep every night.  Limit your stress. General instructions   Keep a journal to find out what may trigger your migraine headaches. For example, write down: ? What you eat and   drink. ? How much sleep you get. ? Any change to your diet or medicines.  If you have a migraine: ? Avoid things that make your symptoms worse, such as bright lights. ? It may help to lie down in a dark, quiet room. ? Do not drive or use heavy machinery. ? Ask your health care provider  what activities are safe for you while you are experiencing symptoms.  Keep all follow-up visits as told by your health care provider. This is important. Contact a health care provider if:  You develop symptoms that are different or more severe than your usual migraine symptoms. Get help right away if:  Your migraine becomes severe.  You have a fever.  You have a stiff neck.  You have vision loss.  Your muscles feel weak or like you cannot control them.  You start to lose your balance often.  You develop trouble walking.  You faint. This information is not intended to replace advice given to you by your health care provider. Make sure you discuss any questions you have with your health care provider. Document Released: 03/04/2005 Document Revised: 09/22/2015 Document Reviewed: 08/21/2015 Elsevier Interactive Patient Education  2017 [employer]   

## 2017-03-06 IMAGING — US US MFM OB LIMITED
1 series · 15 of 27 positions shown · non-contrast
Comparison: none

[Series 1: us mfm ob limited · 15 of 27 slices shown]
[im 1/27]
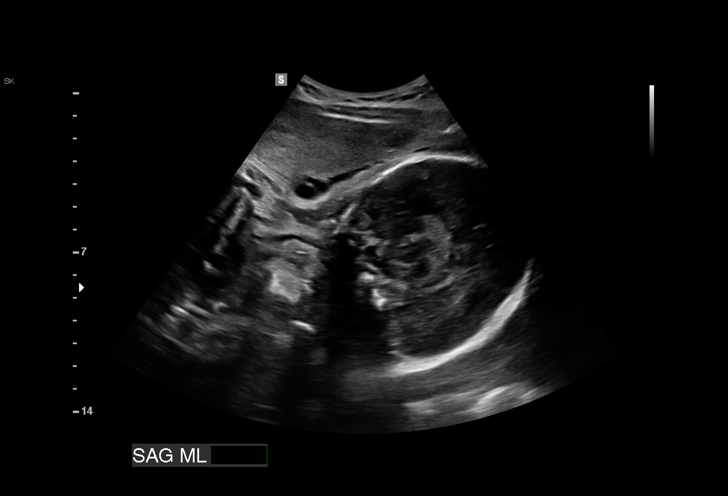
[im 3/27]
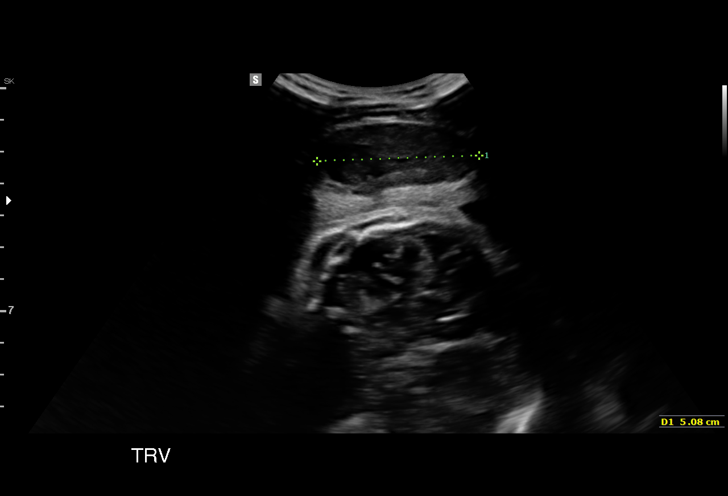
[im 5/27]
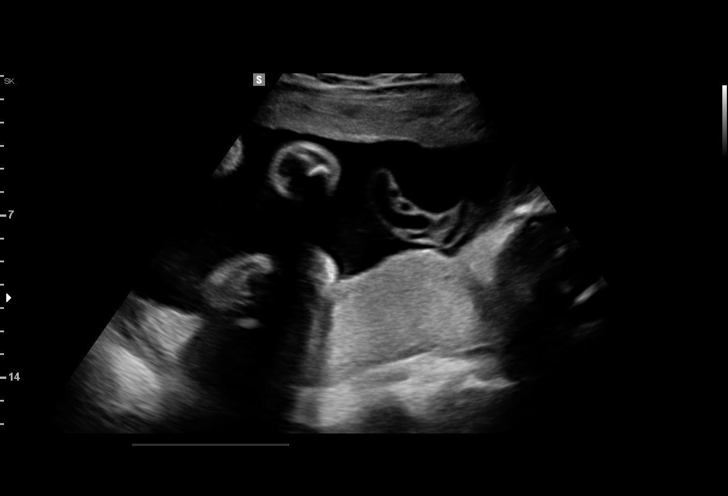
[im 7/27]
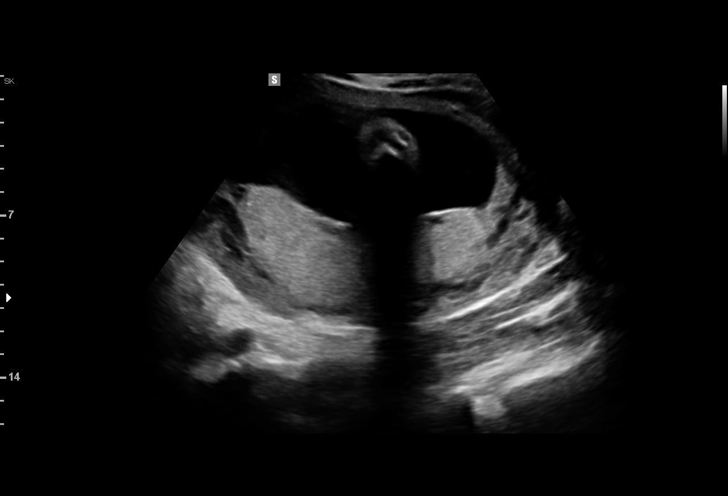
[im 9/27]
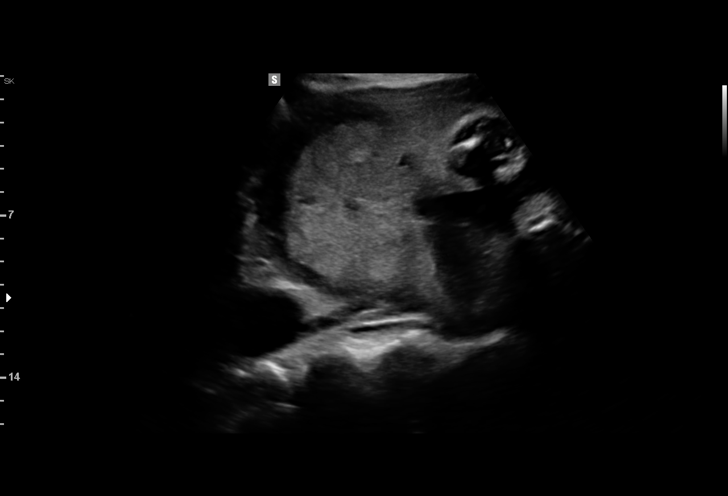
[im 10/27]
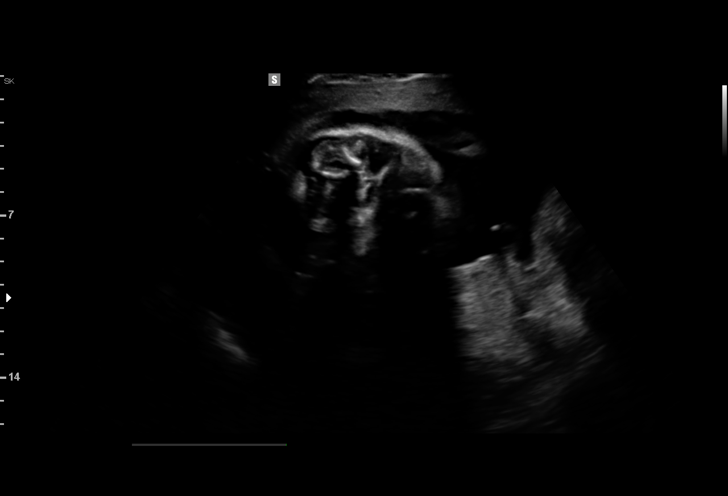
[im 12/27]
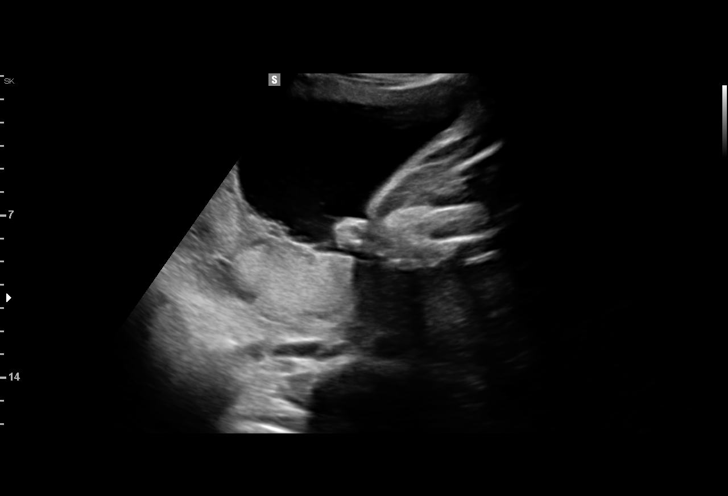
[im 14/27]
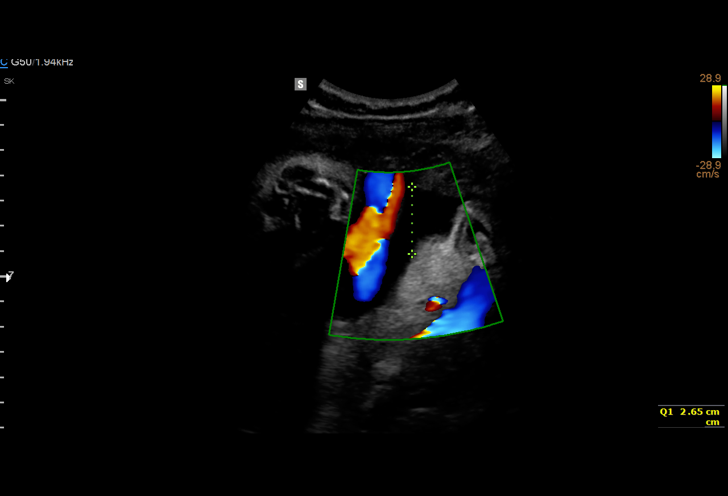
[im 16/27]
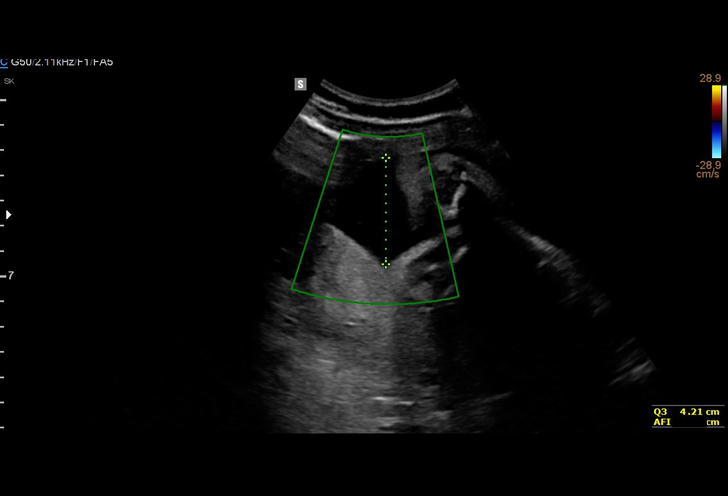
[im 18/27]
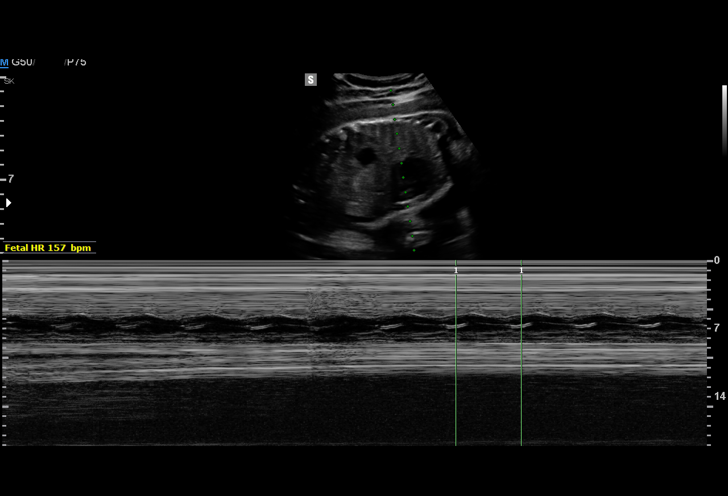
[im 19/27]
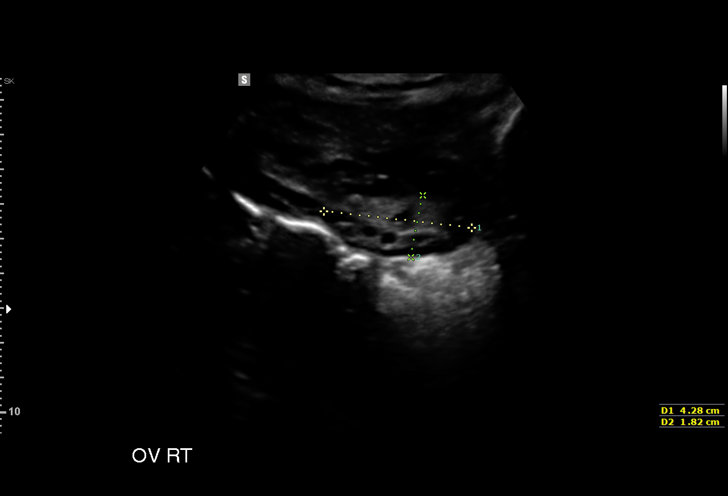
[im 21/27]
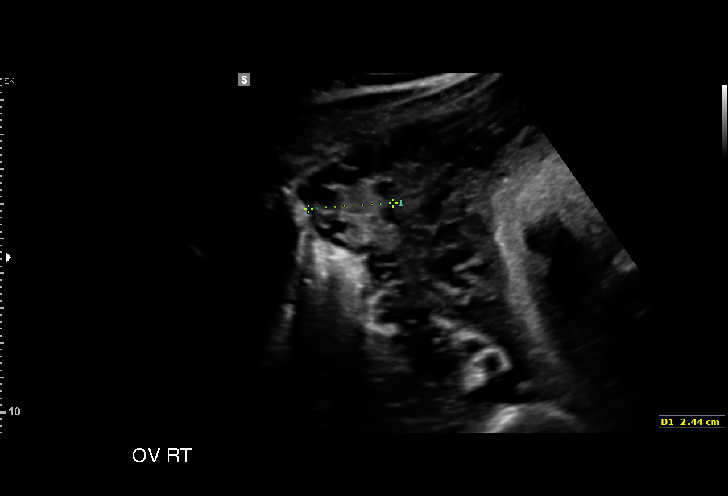
[im 23/27]
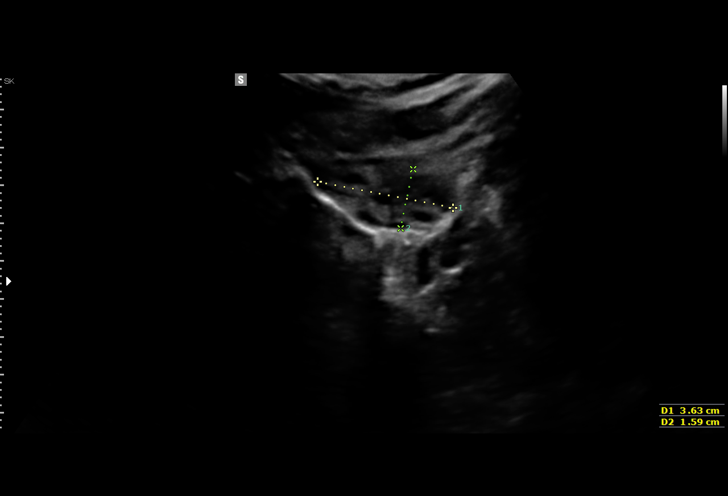
[im 25/27]
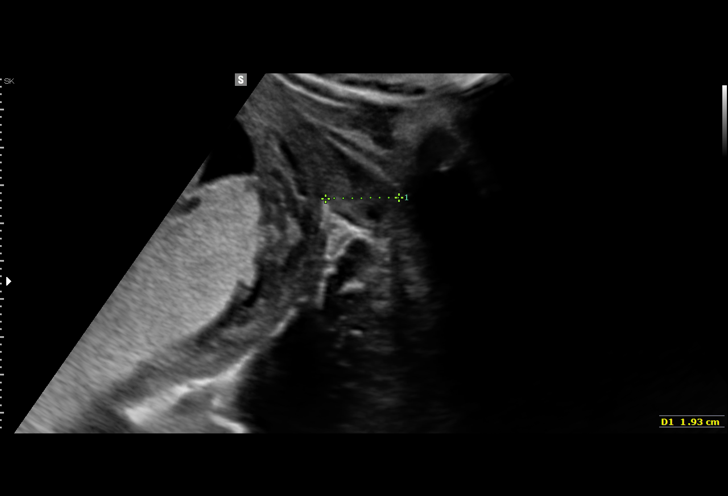
[im 27/27]
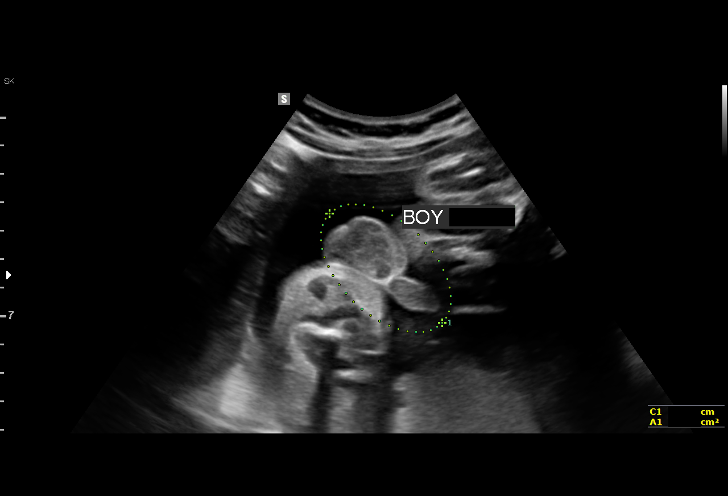

[15 of 27 positions shown; findings below may reference images not displayed]

[REDACTED]
[facility]ANJEN CNM
[facility]

1  IBIFURO MEREDITH                [id number]        [phone]     [id number]
Indications

30 weeks gestation of pregnancy
Abdominal pain in pregnancy
Advanced maternal age multigravida 35+,
second trimester; declined genetic screening
Uterine fibroids affecting pregnancy in third  O34.13,
trimester, antepartum
OB History

Gravidity:    3         Term:   0        Prem:   0        SAB:   2
TOP:          0       Ectopic:  0        Living: 0
Fetal Evaluation

Num Of Fetuses:     1
Fetal Heart         157
Rate(bpm):
Cardiac Activity:   Observed
Presentation:       Cephalic
Placenta:           Posterior, above cervical os

Amniotic Fluid
AFI FV:      Subjectively within normal limits

AFI Sum(cm)     %Tile       Largest Pocket(cm)
16.31           59
RUQ(cm)       RLQ(cm)       LUQ(cm)        LLQ(cm)
2.65

Comment:    No placental abruption identified.
Gestational Age

LMP:           30w 5d        Date:  02/19/15                 EDD:   11/26/15
Best:          30w 5d     Det. By:  LMP  (02/19/15)          EDD:   11/26/15
Cervix Uterus Adnexa

Cervix
Not visualized (advanced GA >91wks)

Left Ovary
Within normal limits.

Right Ovary
Within normal limits.
Myomas

Site                     L(cm)      W(cm)      D(cm)      Location
LUS

Blood Flow                 RI        PI       Comments
Degenerating
Comments

Area of pain corresponds to LUS myoma with degeneration
of central myoma.
Impression

Single IUP at 30w 5d
Limited ultrasound performed due to abdominal pain
A 5.1 x 2.4 x 3.7 cm lower uterine segement myoma is noted
(possibly a degenerating myoma)
Posterior placenta without previa
No ultrasound findings suspicious for placental abruption are
noted
Normal amniotic fluid volume
Recommendations

Follow-up ultrasounds as clinically indicated.

## 2017-04-28 ENCOUNTER — Ambulatory Visit (INDEPENDENT_AMBULATORY_CARE_PROVIDER_SITE_OTHER): Payer: Medicaid Other | Admitting: Physician Assistant

## 2017-04-28 ENCOUNTER — Encounter (INDEPENDENT_AMBULATORY_CARE_PROVIDER_SITE_OTHER): Payer: Self-pay | Admitting: Physician Assistant

## 2017-04-28 ENCOUNTER — Ambulatory Visit (INDEPENDENT_AMBULATORY_CARE_PROVIDER_SITE_OTHER): Payer: Self-pay | Admitting: Physician Assistant

## 2017-04-28 VITALS — BP 119/83 | HR 82 | Temp 97.8°F | Resp 18 | Ht 60.0 in | Wt 164.0 lb

## 2017-04-28 DIAGNOSIS — J0100 Acute maxillary sinusitis, unspecified: Secondary | ICD-10-CM | POA: Diagnosis not present

## 2017-04-28 MED ORDER — AZITHROMYCIN 250 MG PO TABS: ORAL_TABLET | ORAL | 0 refills | Status: DC

## 2017-04-28 MED ORDER — PHENYLEPHRINE HCL 1 % NA SOLN: 1.0000 [drp] | Freq: Four times a day (QID) | NASAL | 0 refills | Status: DC | PRN

## 2017-04-28 MED ORDER — GUAIFENESIN ER 1200 MG PO TB12: 1.0000 | ORAL_TABLET | Freq: Two times a day (BID) | ORAL | 0 refills | Status: AC

## 2017-04-28 NOTE — Progress Notes (Signed)
Subjective:  Patient ID: [name] [name], female    DOB: 1980/08/04  Age: 37 y.o. MRN: [id number]  CC: URI  HPI  [name] Grecco is a 37 y.o. female with a medical history of migraine headaches and HTN presents with one month history of nasal congestion. Says her son became ill and infected her with another virus or bacteria. He is currently on abx. Reports maxillary sinus pressure and occasional cough. No fever, chills, or malaise. Does not endorse any other symptoms or complaints.     Outpatient Medications Prior to Visit  Medication Sig Dispense Refill  . amitriptyline (ELAVIL) 25 MG tablet Take 1 tablet (25 mg total) by mouth at bedtime. 30 tablet 2  . Prenatal Vit-Fe Fumarate-FA (MULTIVITAMIN-PRENATAL) 27-0.8 MG TABS tablet Take 1 tablet by mouth daily at 12 noon.    . SUMAtriptan (IMITREX) 50 MG tablet May take 1 tablet at start of headache, may take a second within 2 hours. Do not take more than 2 tablets cinnamon 24-hour period 10 tablet 2   No facility-administered medications prior to visit.      ROS Review of Systems  Constitutional: Negative for chills, fever and malaise/fatigue.  HENT: Positive for congestion and sinus pain. Negative for ear pain.   Eyes: Negative for blurred vision and pain.  Respiratory: Positive for cough. Negative for shortness of breath.   Cardiovascular: Negative for chest pain and palpitations.  Gastrointestinal: Negative for abdominal pain and nausea.  Genitourinary: Negative for dysuria and hematuria.  Musculoskeletal: Negative for joint pain and myalgias.  Skin: Negative for rash.  Neurological: Negative for tingling and headaches.  Psychiatric/Behavioral: Negative for depression. The patient is not nervous/anxious.     Objective:  There were no vitals taken for this visit.  BP/Weight 06/19/2016 05/22/2016 3/41/9622  Systolic BP 297 989 211  Diastolic BP 941 79 95  Wt. (Lbs) 149.2 150 -  BMI 26.43 26.57 -      Physical Exam   Constitutional: She is oriented to person, place, and time.  Well developed, well nourished, NAD, polite  HENT:  Head: Normocephalic and atraumatic.  Mild TTP on the maxillary sinus bilaterally. Turbinates moderately to severely hypertrophic.  Eyes: Conjunctivae and EOM are normal. No scleral icterus.  Neck: Normal range of motion. Neck supple. No thyromegaly present.  Cardiovascular: Normal rate, regular rhythm and normal heart sounds.  Pulmonary/Chest: Effort normal and breath sounds normal. No respiratory distress. She has no wheezes.  Abdominal: Soft. Bowel sounds are normal. There is no tenderness.  Musculoskeletal: She exhibits no edema.  Lymphadenopathy:    She has cervical adenopathy.  Neurological: She is alert and oriented to person, place, and time. No cranial nerve deficit. Coordination normal.  Skin: Skin is warm and dry. No rash noted. No erythema. No pallor.  Psychiatric: She has a normal mood and affect. Her behavior is normal. Thought content normal.  Vitals reviewed.    Assessment & Plan:    1. Acute non-recurrent maxillary sinusitis - azithromycin (ZITHROMAX) 250 MG tablet; Day one- Take two tablets at once. Day two and thereafter- take one tablet daily  Dispense: 6 tablet; Refill: 0 - Guaifenesin (MUCINEX MAXIMUM STRENGTH) 1200 MG TB12; Take 1 tablet (1,200 mg total) by mouth 2 (two) times daily for 5 days.  Dispense: 10 tablet; Refill: 0 - phenylephrine (NEO-SYNEPHRINE) 1 % nasal spray; Place 1 drop into both nostrils every 6 (six) hours as needed for congestion.  Dispense: 30 mL; Refill: 0   Meds ordered this encounter  Medications  . azithromycin (ZITHROMAX) 250 MG tablet    Sig: Day one- Take two tablets at once. Day two and thereafter- take one tablet daily    Dispense:  6 tablet    Refill:  0    Order Specific Question:   Supervising Provider    Answer:   Tresa Garter [id number]  . Guaifenesin (MUCINEX MAXIMUM STRENGTH) 1200 MG TB12    Sig:  Take 1 tablet (1,200 mg total) by mouth 2 (two) times daily for 5 days.    Dispense:  10 tablet    Refill:  0    Order Specific Question:   Supervising Provider    Answer:   Tresa Garter [id number]  . phenylephrine (NEO-SYNEPHRINE) 1 % nasal spray    Sig: Place 1 drop into both nostrils every 6 (six) hours as needed for congestion.    Dispense:  30 mL    Refill:  0    Order Specific Question:   Supervising Provider    Answer:   Tresa Garter [id number]    Follow-up: Return if symptoms worsen or fail to improve.   Clent Demark PA

## 2017-04-28 NOTE — Patient Instructions (Signed)

## 2017-11-03 ENCOUNTER — Ambulatory Visit (INDEPENDENT_AMBULATORY_CARE_PROVIDER_SITE_OTHER): Payer: Medicaid Other

## 2017-11-03 VITALS — BP 116/76 | HR 89 | Resp 16 | Ht 61.0 in | Wt 165.4 lb

## 2017-11-03 DIAGNOSIS — Z3200 Encounter for pregnancy test, result unknown: Secondary | ICD-10-CM

## 2017-11-03 LAB — POCT URINE PREGNANCY: Preg Test, Ur: POSITIVE — AB

## 2017-11-03 NOTE — Progress Notes (Signed)
[name] [name] presents today for UPT. She has no unusual complaints. LMP: 09/15/2017    OBJECTIVE: Appears well, in no apparent distress.  OB History    Gravida  3   Para  1   Term  1   Preterm      AB  2   Living  1     SAB  2   TAB      Ectopic      Multiple  0   Live Births  1          Home UPT Result: Not applicable In-Office UPT result: Positive I have reviewed the patient's medical, obstetrical, social, and family histories, and medications.   ASSESSMENT: Positive pregnancy test  PLAN:  Prenatal care to be completed at:  [facility] Schedule Initial OB for 10-12 weeks  Continue taking prenatal vitamins Any bleeding, shortness of breath, chest pain, acute dizziness go to [facility] for evaluation and treatment.

## 2017-11-03 NOTE — Progress Notes (Signed)
Patient seen and assessed by nursing staff.  Agree with documentation and plan.  

## 2017-11-24 ENCOUNTER — Encounter (HOSPITAL_COMMUNITY): Payer: Self-pay | Admitting: *Deleted

## 2017-11-24 ENCOUNTER — Inpatient Hospital Stay (HOSPITAL_COMMUNITY): Payer: Medicaid Other

## 2017-11-24 ENCOUNTER — Inpatient Hospital Stay (HOSPITAL_COMMUNITY)
Admission: AD | Admit: 2017-11-24 | Discharge: 2017-11-24 | Disposition: A | Discharge status: Home / Self Care | Payer: Medicaid Other | Source: Ambulatory Visit | Attending: Obstetrics & Gynecology | Admitting: Obstetrics & Gynecology

## 2017-11-24 DIAGNOSIS — Z79899 Other long term (current) drug therapy: Secondary | ICD-10-CM | POA: Insufficient documentation

## 2017-11-24 DIAGNOSIS — D251 Intramural leiomyoma of uterus: Secondary | ICD-10-CM | POA: Insufficient documentation

## 2017-11-24 DIAGNOSIS — O021 Missed abortion: Secondary | ICD-10-CM | POA: Diagnosis not present

## 2017-11-24 DIAGNOSIS — Z882 Allergy status to sulfonamides status: Secondary | ICD-10-CM | POA: Insufficient documentation

## 2017-11-24 DIAGNOSIS — Z8249 Family history of ischemic heart disease and other diseases of the circulatory system: Secondary | ICD-10-CM | POA: Diagnosis not present

## 2017-11-24 DIAGNOSIS — O161 Unspecified maternal hypertension, first trimester: Secondary | ICD-10-CM | POA: Insufficient documentation

## 2017-11-24 DIAGNOSIS — Z9889 Other specified postprocedural states: Secondary | ICD-10-CM | POA: Diagnosis not present

## 2017-11-24 DIAGNOSIS — Z3A10 10 weeks gestation of pregnancy: Secondary | ICD-10-CM | POA: Insufficient documentation

## 2017-11-24 DIAGNOSIS — O3411 Maternal care for benign tumor of corpus uteri, first trimester: Secondary | ICD-10-CM | POA: Diagnosis not present

## 2017-11-24 DIAGNOSIS — O209 Hemorrhage in early pregnancy, unspecified: Secondary | ICD-10-CM

## 2017-11-24 DIAGNOSIS — Z880 Allergy status to penicillin: Secondary | ICD-10-CM | POA: Insufficient documentation

## 2017-11-24 LAB — URINALYSIS, ROUTINE W REFLEX MICROSCOPIC
Bilirubin Urine: NEGATIVE
GLUCOSE, UA: NEGATIVE mg/dL
Ketones, ur: 20 mg/dL — AB
Nitrite: NEGATIVE
PROTEIN: NEGATIVE mg/dL
Specific Gravity, Urine: 1.012 (ref 1.005–1.030)
pH: 6 (ref 5.0–8.0)

## 2017-11-24 LAB — WET PREP, GENITAL
Clue Cells Wet Prep HPF POC: NONE SEEN
SPERM: NONE SEEN
TRICH WET PREP: NONE SEEN
YEAST WET PREP: NONE SEEN

## 2017-11-24 LAB — CBC
HEMATOCRIT: 35.6 % — AB (ref 36.0–46.0)
Hemoglobin: 12.2 g/dL (ref 12.0–15.0)
MCH: 29.8 pg (ref 26.0–34.0)
MCHC: 34.3 g/dL (ref 30.0–36.0)
MCV: 87 fL (ref 78.0–100.0)
Platelets: 291 10*3/uL (ref 150–400)
RBC: 4.09 MIL/uL (ref 3.87–5.11)
RDW: 12.6 % (ref 11.5–15.5)
WBC: 6.4 10*3/uL (ref 4.0–10.5)

## 2017-11-24 LAB — HCG, QUANTITATIVE, PREGNANCY: hCG, Beta Chain, Quant, S: 129519 m[IU]/mL — ABNORMAL HIGH (ref <5)

## 2017-11-24 MED ORDER — PROMETHAZINE HCL 25 MG PO TABS: 12.5000 mg | ORAL_TABLET | Freq: Four times a day (QID) | ORAL | 0 refills | Status: DC | PRN

## 2017-11-24 MED ORDER — MISOPROSTOL 200 MCG PO TABS: 800.0000 ug | ORAL_TABLET | Freq: Once | ORAL | 0 refills | Status: DC

## 2017-11-24 MED ORDER — OXYCODONE-ACETAMINOPHEN 5-325 MG PO TABS: 1.0000 | ORAL_TABLET | Freq: Four times a day (QID) | ORAL | 0 refills | Status: DC | PRN

## 2017-11-24 NOTE — MAU Provider Note (Addendum)
History   [name] [name] is a 36 year old African American female presenting with a chief complaint of vaginal bleeding.She is G4P1021 and is approximately [redacted] weeks gestation. She had a positive pregnancy test August 23rd but has had no ultrasound. Patient states that she started having cramps and bleeding  today and is concerned she is having a miscarriage. One hour before she came in, she stated that she had a "gush" of blood. After the initial bleeding she stated that it has lightened to spotting. She reports no major complications with the pregnancy thus far. She states that she has been nauseated since she became pregnant. Patient reports that she is anxious about this being another miscarriage.She denies abdominal pain and constipation.  CSN: [id number]  Arrival date and time: 11/24/17 1610   First Provider Initiated Contact with Patient 11/24/17 1758      Chief Complaint  Patient presents with  . Abdominal Pain  . Vaginal Bleeding   HPI  Pertinent Gynecological History: Menses: irregular menstrual history Bleeding: dysfunctional uterine bleeding Contraception: none DES exposure: unknown Blood transfusions: none Sexually transmitted diseases: no past history Previous GYN Procedures: none    Past Medical History:  Diagnosis Date  . Headache   . Hypertension     Past Surgical History:  Procedure Laterality Date  . LEG SURGERY Left 1999   steel rod in left femur bone     Family History  Problem Relation Age of Onset  . Hyperlipidemia Mother   . Heart disease Maternal Aunt   . Heart disease Maternal Uncle   . Heart disease Paternal Aunt   . Heart disease Maternal Grandfather   . Heart disease Paternal Grandfather     Social History   Tobacco Use  . Smoking status: Never Smoker  . Smokeless tobacco: Never Used  Substance Use Topics  . Alcohol use: Not Currently    Comment: wine   . Drug use: No    Allergies:  Allergies  Allergen Reactions  . Bactrim  [Sulfamethoxazole-Trimethoprim] Hives and Swelling  . Penicillins     Fever-pt was really sick. Has patient had a PCN reaction causing immediate rash, facial/tongue/throat swelling, SOB or lightheadedness with hypotension: No Has patient had a PCN reaction causing severe rash involving mucus membranes or skin necrosis: No Has patient had a PCN reaction that required hospitalization Yes Has patient had a PCN reaction occurring within the last 10 years: No If all of the above answers are "NO", then may proceed with Cephalosporin use.     Medications Prior to Admission  Medication Sig Dispense Refill Last Dose  . phenylephrine (NEO-SYNEPHRINE) 1 % nasal spray Place 1 drop into both nostrils every 6 (six) hours as needed for congestion. 30 mL 0 Taking  . Prenatal Vit-Fe Fumarate-FA (MULTIVITAMIN-PRENATAL) 27-0.8 MG TABS tablet Take 1 tablet by mouth daily at 12 noon.   Taking    Review of Systems Physical Exam   Blood pressure (!) 141/79, pulse 62, temperature 98.3 F (36.8 C), temperature source Oral, resp. rate 15, height 5\' 1"  (1.549 m), weight 72.6 kg, last menstrual period 09/15/2017, SpO2 100 %.  Physical Exam  Pelvic exam: External genitalia without lesions, swelling or erythema. Speculum exam revealed pink, moist vaginal mucosa. Cervical os was multiparous. There was small amounts of blood noted on vaginal walls and cervix. Culture and GC/chylmydia swabs were obtained. Bimanual exam revealed no masses or cervical motion tenderness.   US Ob Comp Less 14 Wks  Result Date: 11/24/2017 CLINICAL DATA:  Pregnant, bleeding; no quantitative beta HCG for correlation; EGA [redacted] weeks 0 days by LMP EXAM: OBSTETRIC <14 WK [place] AND TRANSVAGINAL OB US TECHNIQUE: Both transabdominal and transvaginal ultrasound examinations were performed for complete evaluation of the gestation as well as the maternal uterus, adnexal regions, and pelvic cul-de-sac. Transvaginal technique was performed to assess early  pregnancy. COMPARISON:  None. FINDINGS: Intrauterine gestational sac: Present, single Yolk sac:  Present Embryo:  Present Cardiac Activity: None detected Heart Rate: N/A  bpm CRL:  17.8 mm   8 w   1 d                  [place] EDC: 07/05/2018 Subchorionic hemorrhage:  None identified Maternal uterus/adnexae: Small intramural leiomyomas 10 x 8 x 12 mm and 6 x 5 x 7 mm. RIGHT ovary normal size and morphology, 3.3 x 0.9 x 0.8 cm. LEFT ovary measures 2.3 x 3.2 x 2.1 cm and contains a small complex corpus luteum. No free pelvic fluid or adnexal masses. IMPRESSION: Single intrauterine gestation identified with a fetal pole measuring 17.8 mm length. No fetal cardiac activity detected. Findings meet definitive criteria for failed pregnancy. This follows SRU consensus guidelines: Diagnostic Criteria for Nonviable Pregnancy Early in the First Trimester. Alison Stalling J Med [phone]. Electronically Signed   By: Lavonia Dana M.D.   On: 11/24/2017 19:05   US Ob Transvaginal  Result Date: 11/24/2017 CLINICAL DATA:  Pregnant, bleeding; no quantitative beta HCG for correlation; EGA [redacted] weeks 0 days by LMP EXAM: OBSTETRIC <14 WK [place] AND TRANSVAGINAL OB US TECHNIQUE: Both transabdominal and transvaginal ultrasound examinations were performed for complete evaluation of the gestation as well as the maternal uterus, adnexal regions, and pelvic cul-de-sac. Transvaginal technique was performed to assess early pregnancy. COMPARISON:  None. FINDINGS: Intrauterine gestational sac: Present, single Yolk sac:  Present Embryo:  Present Cardiac Activity: None detected Heart Rate: N/A  bpm CRL:  17.8 mm   8 w   1 d                  [place] EDC: 07/05/2018 Subchorionic hemorrhage:  None identified Maternal uterus/adnexae: Small intramural leiomyomas 10 x 8 x 12 mm and 6 x 5 x 7 mm. RIGHT ovary normal size and morphology, 3.3 x 0.9 x 0.8 cm. LEFT ovary measures 2.3 x 3.2 x 2.1 cm and contains a small complex corpus luteum. No free pelvic fluid or adnexal  masses. IMPRESSION: Single intrauterine gestation identified with a fetal pole measuring 17.8 mm length. No fetal cardiac activity detected. Findings meet definitive criteria for failed pregnancy. This follows SRU consensus guidelines: Diagnostic Criteria for Nonviable Pregnancy Early in the First Trimester. Alison Stalling J Med [phone]. Electronically Signed   By: Lavonia Dana M.D.   On: 11/24/2017 19:05   Results for orders placed or performed during the hospital encounter of 11/24/17 (from the past 24 hour(s))  Urinalysis, Routine w reflex microscopic     Status: Abnormal   Collection Time: 11/24/17  4:48 PM  Result Value Ref Range   Color, Urine YELLOW YELLOW   APPearance HAZY (A) CLEAR   Specific Gravity, Urine 1.012 1.005 - 1.030   pH 6.0 5.0 - 8.0   Glucose, UA NEGATIVE NEGATIVE mg/dL   Hgb urine dipstick LARGE (A) NEGATIVE   Bilirubin Urine NEGATIVE NEGATIVE   Ketones, ur 20 (A) NEGATIVE mg/dL   Protein, ur NEGATIVE NEGATIVE mg/dL   Nitrite NEGATIVE NEGATIVE   Leukocytes, UA SMALL (A) NEGATIVE  RBC / HPF 11-20 0 - 5 RBC/hpf   WBC, UA 6-10 0 - 5 WBC/hpf   Bacteria, UA FEW (A) NONE SEEN   Squamous Epithelial / LPF 0-5 0 - 5   Mucus PRESENT   CBC     Status: Abnormal   Collection Time: 11/24/17  5:57 PM  Result Value Ref Range   WBC 6.4 4.0 - 10.5 K/uL   RBC 4.09 3.87 - 5.11 MIL/uL   Hemoglobin 12.2 12.0 - 15.0 g/dL   HCT 35.6 (L) 36.0 - 46.0 %   MCV 87.0 78.0 - 100.0 fL   MCH 29.8 26.0 - 34.0 pg   MCHC 34.3 30.0 - 36.0 g/dL   RDW 12.6 11.5 - 15.5 %   Platelets 291 150 - 400 K/uL  hCG, quantitative, pregnancy     Status: Abnormal   Collection Time: 11/24/17  5:57 PM  Result Value Ref Range   hCG, Beta Chain, Quant, S 129,519 (H) <5 mIU/mL  Wet prep, genital     Status: Abnormal   Collection Time: 11/24/17  6:11 PM  Result Value Ref Range   Yeast Wet Prep HPF POC NONE SEEN NONE SEEN   Trich, Wet Prep NONE SEEN NONE SEEN   Clue Cells Wet Prep HPF POC NONE SEEN NONE SEEN    WBC, Wet Prep HPF POC MANY (A) NONE SEEN   Sperm NONE SEEN     MAU Course  Procedures  MDM Ectopic pregnancy ruled out due to ultrasound findings. Infection ruled out due to normal CBC and wet prep.   Assessment and Plan   1. Missed abortion   2. Vaginal bleeding in pregnancy, first trimester    Discussed results of ultrasound that showed missed abortion with the patient. Discussed expectant management verses D&C verses Cytotec. Patient is going to do expectant management. Follow-up in 2 weeks for missed abortion.   Cornell Barman 11/24/2017, 6:35 PM

## 2017-11-24 NOTE — MAU Note (Signed)
Pt reports vaginal bleeding today like a period, some mild cramping

## 2017-11-24 NOTE — MAU Provider Note (Signed)
Chief Complaint: Abdominal Pain and Vaginal Bleeding   First Provider Initiated Contact with Patient 11/24/17 1758      SUBJECTIVE HPI: [name] [name] is a 37 y.o. [id number] at [redacted]w[redacted]d by LMP who presents to maternity admissions reporting gush of blood today on the toilet, associated with abdominal cramping.  She reports cramping started just before the bleeding and continued for a couple of hours after. Now, bleeding is scant, spotting only, and cramping has resolved. There are no other associated symptoms. She has not tried any treatments.     HPI  Past Medical History:  Diagnosis Date  . Headache   . Hypertension    Past Surgical History:  Procedure Laterality Date  . LEG SURGERY Left 1999   steel rod in left femur bone    Social History   Socioeconomic History  . Marital status: Married    Spouse name: Not on file  . Number of children: Not on file  . Years of education: Not on file  . Highest education level: Not on file  Occupational History  . Not on file  Social Needs  . Financial resource strain: Not on file  . Food insecurity:    Worry: Not on file    Inability: Not on file  . Transportation needs:    Medical: Not on file    Non-medical: Not on file  Tobacco Use  . Smoking status: Never Smoker  . Smokeless tobacco: Never Used  Substance and Sexual Activity  . Alcohol use: Not Currently    Comment: wine   . Drug use: No  . Sexual activity: Yes    Birth control/protection: None  Lifestyle  . Physical activity:    Days per week: Not on file    Minutes per session: Not on file  . Stress: Not on file  Relationships  . Social connections:    Talks on phone: Not on file    Gets together: Not on file    Attends religious service: Not on file    Active member of club or organization: Not on file    Attends meetings of clubs or organizations: Not on file    Relationship status: Not on file  . Intimate partner violence:    Fear of current or ex partner: Not on  file    Emotionally abused: Not on file    Physically abused: Not on file    Forced sexual activity: Not on file  Other Topics Concern  . Not on file  Social History Narrative  . Not on file   No current facility-administered medications on file prior to encounter.    Current Outpatient Medications on File Prior to Encounter  Medication Sig Dispense Refill  . phenylephrine (NEO-SYNEPHRINE) 1 % nasal spray Place 1 drop into both nostrils every 6 (six) hours as needed for congestion. 30 mL 0  . Prenatal Vit-Fe Fumarate-FA (MULTIVITAMIN-PRENATAL) 27-0.8 MG TABS tablet Take 1 tablet by mouth daily at 12 noon.     Allergies  Allergen Reactions  . Bactrim [Sulfamethoxazole-Trimethoprim] Hives and Swelling  . Penicillins     Fever-pt was really sick. Has patient had a PCN reaction causing immediate rash, facial/tongue/throat swelling, SOB or lightheadedness with hypotension: No Has patient had a PCN reaction causing severe rash involving mucus membranes or skin necrosis: No Has patient had a PCN reaction that required hospitalization Yes Has patient had a PCN reaction occurring within the last 10 years: No If all of the above answers are "NO", then  may proceed with Cephalosporin use.     ROS:  Review of Systems  Constitutional: Negative for chills, fatigue and fever.  Respiratory: Negative for shortness of breath.   Cardiovascular: Negative for chest pain.  Genitourinary: Positive for pelvic pain. Negative for difficulty urinating, dysuria, flank pain, vaginal bleeding, vaginal discharge and vaginal pain.  Neurological: Negative for dizziness and headaches.  Psychiatric/Behavioral: Negative.      I have reviewed patient's Past Medical Hx, Surgical Hx, Family Hx, Social Hx, medications and allergies.   Physical Exam   Patient Vitals for the past 24 hrs:  BP Temp Temp src Pulse Resp SpO2 Height Weight  11/24/17 1948 128/69 - - 73 16 - - -  11/24/17 1640 (!) 141/79 98.3 F (36.8  C) Oral 62 15 100 % 5\' 1"  (1.549 m) 72.6 kg   Constitutional: Well-developed, well-nourished female in no acute distress.  Cardiovascular: normal rate Respiratory: normal effort GI: Abd soft, non-tender. Pos BS x 4 MS: Extremities nontender, no edema, normal ROM Neurologic: Alert and oriented x 4.  GU: Neg CVAT.  PELVIC EXAM: Cervix pink, visually closed, without lesion, scant light red bleeding not requiring a fox swab, vaginal walls and external genitalia normal Bimanual exam: Cervix 0/long/high, firm, anterior, neg CMT, uterus nontender, nonenlarged, adnexa without tenderness, enlargement, or mass   LAB RESULTS Results for orders placed or performed during the hospital encounter of 11/24/17 (from the past 24 hour(s))  Urinalysis, Routine w reflex microscopic     Status: Abnormal   Collection Time: 11/24/17  4:48 PM  Result Value Ref Range   Color, Urine YELLOW YELLOW   APPearance HAZY (A) CLEAR   Specific Gravity, Urine 1.012 1.005 - 1.030   pH 6.0 5.0 - 8.0   Glucose, UA NEGATIVE NEGATIVE mg/dL   Hgb urine dipstick LARGE (A) NEGATIVE   Bilirubin Urine NEGATIVE NEGATIVE   Ketones, ur 20 (A) NEGATIVE mg/dL   Protein, ur NEGATIVE NEGATIVE mg/dL   Nitrite NEGATIVE NEGATIVE   Leukocytes, UA SMALL (A) NEGATIVE   RBC / HPF 11-20 0 - 5 RBC/hpf   WBC, UA 6-10 0 - 5 WBC/hpf   Bacteria, UA FEW (A) NONE SEEN   Squamous Epithelial / LPF 0-5 0 - 5   Mucus PRESENT   CBC     Status: Abnormal   Collection Time: 11/24/17  5:57 PM  Result Value Ref Range   WBC 6.4 4.0 - 10.5 K/uL   RBC 4.09 3.87 - 5.11 MIL/uL   Hemoglobin 12.2 12.0 - 15.0 g/dL   HCT 35.6 (L) 36.0 - 46.0 %   MCV 87.0 78.0 - 100.0 fL   MCH 29.8 26.0 - 34.0 pg   MCHC 34.3 30.0 - 36.0 g/dL   RDW 12.6 11.5 - 15.5 %   Platelets 291 150 - 400 K/uL  hCG, quantitative, pregnancy     Status: Abnormal   Collection Time: 11/24/17  5:57 PM  Result Value Ref Range   hCG, Beta Chain, Quant, S 129,519 (H) <5 mIU/mL  Wet prep,  genital     Status: Abnormal   Collection Time: 11/24/17  6:11 PM  Result Value Ref Range   Yeast Wet Prep HPF POC NONE SEEN NONE SEEN   Trich, Wet Prep NONE SEEN NONE SEEN   Clue Cells Wet Prep HPF POC NONE SEEN NONE SEEN   WBC, Wet Prep HPF POC MANY (A) NONE SEEN   Sperm NONE SEEN        IMAGING US Ob Comp  Less 14 Wks  Result Date: 11/24/2017 CLINICAL DATA:  Pregnant, bleeding; no quantitative beta HCG for correlation; EGA [redacted] weeks 0 days by LMP EXAM: OBSTETRIC <14 WK [place] AND TRANSVAGINAL OB US TECHNIQUE: Both transabdominal and transvaginal ultrasound examinations were performed for complete evaluation of the gestation as well as the maternal uterus, adnexal regions, and pelvic cul-de-sac. Transvaginal technique was performed to assess early pregnancy. COMPARISON:  None. FINDINGS: Intrauterine gestational sac: Present, single Yolk sac:  Present Embryo:  Present Cardiac Activity: None detected Heart Rate: N/A  bpm CRL:  17.8 mm   8 w   1 d                  [place] EDC: 07/05/2018 Subchorionic hemorrhage:  None identified Maternal uterus/adnexae: Small intramural leiomyomas 10 x 8 x 12 mm and 6 x 5 x 7 mm. RIGHT ovary normal size and morphology, 3.3 x 0.9 x 0.8 cm. LEFT ovary measures 2.3 x 3.2 x 2.1 cm and contains a small complex corpus luteum. No free pelvic fluid or adnexal masses. IMPRESSION: Single intrauterine gestation identified with a fetal pole measuring 17.8 mm length. No fetal cardiac activity detected. Findings meet definitive criteria for failed pregnancy. This follows SRU consensus guidelines: Diagnostic Criteria for Nonviable Pregnancy Early in the First Trimester. Alison Stalling J Med [phone]. Electronically Signed   By: Lavonia Dana M.D.   On: 11/24/2017 19:05   US Ob Transvaginal  Result Date: 11/24/2017 CLINICAL DATA:  Pregnant, bleeding; no quantitative beta HCG for correlation; EGA [redacted] weeks 0 days by LMP EXAM: OBSTETRIC <14 WK [place] AND TRANSVAGINAL OB US TECHNIQUE: Both  transabdominal and transvaginal ultrasound examinations were performed for complete evaluation of the gestation as well as the maternal uterus, adnexal regions, and pelvic cul-de-sac. Transvaginal technique was performed to assess early pregnancy. COMPARISON:  None. FINDINGS: Intrauterine gestational sac: Present, single Yolk sac:  Present Embryo:  Present Cardiac Activity: None detected Heart Rate: N/A  bpm CRL:  17.8 mm   8 w   1 d                  [place] EDC: 07/05/2018 Subchorionic hemorrhage:  None identified Maternal uterus/adnexae: Small intramural leiomyomas 10 x 8 x 12 mm and 6 x 5 x 7 mm. RIGHT ovary normal size and morphology, 3.3 x 0.9 x 0.8 cm. LEFT ovary measures 2.3 x 3.2 x 2.1 cm and contains a small complex corpus luteum. No free pelvic fluid or adnexal masses. IMPRESSION: Single intrauterine gestation identified with a fetal pole measuring 17.8 mm length. No fetal cardiac activity detected. Findings meet definitive criteria for failed pregnancy. This follows SRU consensus guidelines: Diagnostic Criteria for Nonviable Pregnancy Early in the First Trimester. Alison Stalling J Med [phone]. Electronically Signed   By: Lavonia Dana M.D.   On: 11/24/2017 19:05    MAU Management/MDM: Ordered labs and [place] and reviewed results.  US shows missed ab at [redacted]w[redacted]d.  Discussed results with pt.  Offered expectant management vs Cytotec vs D&C. Pt prefers expectant management.  Rx for Cytotec sent if pt has no onset of bleeding/cramping and desires active management.  F/U at [facility] in 2 weeks.  Return to MAU with heavy bleeding or emergencies.  Pt discharged with strict bleeding precautions.  ASSESSMENT 1. Missed abortion   2. Vaginal bleeding in pregnancy, first trimester     PLAN Discharge home Allergies as of 11/24/2017      Reactions   Bactrim [sulfamethoxazole-trimethoprim] Hives, Swelling  Penicillins    Fever-pt was really sick. Has patient had a PCN reaction causing immediate rash,  facial/tongue/throat swelling, SOB or lightheadedness with hypotension: No Has patient had a PCN reaction causing severe rash involving mucus membranes or skin necrosis: No Has patient had a PCN reaction that required hospitalization Yes Has patient had a PCN reaction occurring within the last 10 years: No If all of the above answers are "NO", then may proceed with Cephalosporin use.      Medication List    TAKE these medications   misoprostol 200 MCG tablet Commonly known as:  CYTOTEC Take 4 tablets (800 mcg total) by mouth once for 1 dose. Place in cheek and let dissolve before swallowing.   multivitamin-prenatal 27-0.8 MG Tabs tablet Take 1 tablet by mouth daily at 12 noon.   oxyCODONE-acetaminophen 5-325 MG tablet Commonly known as:  PERCOCET/ROXICET Take 1 tablet by mouth every 6 (six) hours as needed for severe pain.   phenylephrine 1 % nasal spray Commonly known as:  NEO-SYNEPHRINE Place 1 drop into both nostrils every 6 (six) hours as needed for congestion.   promethazine 25 MG tablet Commonly known as:  PHENERGAN Take 0.5-1 tablets (12.5-25 mg total) by mouth every 6 (six) hours as needed for nausea.      Follow-up [place] [employer] [place] [facility] Follow up.   Specialty:  Obstetrics and Gynecology Why:  In 2 weeks, the office will call you with an appointment.  Return to MAU as needed for emergencies.  Contact information: [place] Certified Nurse-Midwife 11/24/2017  8:20 PM

## 2017-11-25 ENCOUNTER — Telehealth: Payer: Self-pay | Admitting: Radiology

## 2017-11-25 ENCOUNTER — Telehealth: Payer: Self-pay

## 2017-11-25 ENCOUNTER — Other Ambulatory Visit: Payer: Self-pay

## 2017-11-25 ENCOUNTER — Telehealth (HOSPITAL_COMMUNITY): Payer: Self-pay

## 2017-11-25 ENCOUNTER — Encounter (HOSPITAL_COMMUNITY): Payer: Self-pay | Admitting: *Deleted

## 2017-11-25 DIAGNOSIS — O021 Missed abortion: Secondary | ICD-10-CM

## 2017-11-25 LAB — HIV ANTIBODY (ROUTINE TESTING W REFLEX): HIV SCREEN 4TH GENERATION: NONREACTIVE

## 2017-11-25 LAB — GC/CHLAMYDIA PROBE AMP (~~LOC~~) NOT AT ARMC
CHLAMYDIA, DNA PROBE: NEGATIVE
NEISSERIA GONORRHEA: NEGATIVE

## 2017-11-25 NOTE — Telephone Encounter (Signed)
Called patient, no answer, left voicemail with surgery date & time and pre-op instructions

## 2017-11-25 NOTE — Telephone Encounter (Signed)
Left message for patient to call [id number] to schedule a f/u appointment for missed ab in 2 weeks. Cancelled New OB appointment on 12/02/17

## 2017-11-25 NOTE — Telephone Encounter (Signed)
Patient will be scheduled for D&E soon and will be contacted with the details of this procedure.  Verita Schneiders, MD, [place] for [employer], [place]

## 2017-11-25 NOTE — Telephone Encounter (Signed)
Call patient to inform her we would be scheduling her for a D/C and she should here from our scheduling coordinator soon.

## 2017-11-25 NOTE — Telephone Encounter (Signed)
Good Morning,  I received a message from this patient stating she would like to go ahead and do a D/C instead of cytotec as discuss. Please advised me of what I need to do.

## 2017-11-25 NOTE — Telephone Encounter (Signed)
-----   Message from Osborne Oman, MD sent at 11/25/2017  1:22 PM EDT ----- Please schedule patient for D&E (first trimester) for missed abortion measuring [redacted] weeks GA

## 2017-11-26 MED ORDER — DOXYCYCLINE HYCLATE 100 MG IV SOLR
200.0000 mg | INTRAVENOUS | Status: AC
  Administered 2017-11-27: 200 mg via INTRAVENOUS
  Filled 2017-11-26: qty 200

## 2017-11-27 ENCOUNTER — Encounter (HOSPITAL_COMMUNITY)
Admission: AD | Disposition: A | Discharge status: Home / Self Care | Payer: Self-pay | Source: Ambulatory Visit | Attending: Obstetrics & Gynecology

## 2017-11-27 ENCOUNTER — Ambulatory Visit (HOSPITAL_COMMUNITY): Payer: Medicaid Other | Admitting: Anesthesiology

## 2017-11-27 ENCOUNTER — Ambulatory Visit (HOSPITAL_COMMUNITY)
Admission: AD | Admit: 2017-11-27 | Discharge: 2017-11-27 | Disposition: A | Discharge status: Home / Self Care | Payer: Medicaid Other | Source: Ambulatory Visit | Attending: Obstetrics & Gynecology | Admitting: Obstetrics & Gynecology

## 2017-11-27 ENCOUNTER — Encounter (HOSPITAL_COMMUNITY): Payer: Self-pay

## 2017-11-27 ENCOUNTER — Other Ambulatory Visit: Payer: Self-pay

## 2017-11-27 DIAGNOSIS — Z880 Allergy status to penicillin: Secondary | ICD-10-CM | POA: Diagnosis not present

## 2017-11-27 DIAGNOSIS — Z882 Allergy status to sulfonamides status: Secondary | ICD-10-CM | POA: Diagnosis not present

## 2017-11-27 DIAGNOSIS — O021 Missed abortion: Secondary | ICD-10-CM | POA: Diagnosis present

## 2017-11-27 DIAGNOSIS — Z3A08 8 weeks gestation of pregnancy: Secondary | ICD-10-CM | POA: Diagnosis present

## 2017-11-27 DIAGNOSIS — D251 Intramural leiomyoma of uterus: Secondary | ICD-10-CM | POA: Insufficient documentation

## 2017-11-27 DIAGNOSIS — O161 Unspecified maternal hypertension, first trimester: Secondary | ICD-10-CM | POA: Diagnosis not present

## 2017-11-27 DIAGNOSIS — O99341 Other mental disorders complicating pregnancy, first trimester: Secondary | ICD-10-CM | POA: Insufficient documentation

## 2017-11-27 DIAGNOSIS — O3411 Maternal care for benign tumor of corpus uteri, first trimester: Secondary | ICD-10-CM | POA: Insufficient documentation

## 2017-11-27 DIAGNOSIS — F329 Major depressive disorder, single episode, unspecified: Secondary | ICD-10-CM | POA: Diagnosis not present

## 2017-11-27 HISTORY — DX: Renal tubulo-interstitial disease, unspecified: N15.9

## 2017-11-27 HISTORY — DX: Major depressive disorder, single episode, unspecified: F32.9

## 2017-11-27 HISTORY — DX: Anemia, unspecified: D64.9

## 2017-11-27 HISTORY — PX: DILATION AND EVACUATION: SHX1459

## 2017-11-27 HISTORY — DX: Depression, unspecified: F32.A

## 2017-11-27 LAB — CBC
HCT: 34.5 % — ABNORMAL LOW (ref 36.0–46.0)
HEMOGLOBIN: 11.8 g/dL — AB (ref 12.0–15.0)
MCH: 30.2 pg (ref 26.0–34.0)
MCHC: 34.2 g/dL (ref 30.0–36.0)
MCV: 88.2 fL (ref 78.0–100.0)
PLATELETS: 269 10*3/uL (ref 150–400)
RBC: 3.91 MIL/uL (ref 3.87–5.11)
RDW: 12.8 % (ref 11.5–15.5)
WBC: 4.5 10*3/uL (ref 4.0–10.5)

## 2017-11-27 SURGERY — DILATION AND EVACUATION, UTERUS
Anesthesia: General

## 2017-11-27 MED ORDER — ONDANSETRON HCL 4 MG/2ML IJ SOLN
INTRAMUSCULAR | Status: AC
  Filled 2017-11-27: qty 2

## 2017-11-27 MED ORDER — DEXAMETHASONE SODIUM PHOSPHATE 10 MG/ML IJ SOLN
INTRAMUSCULAR | Status: AC
  Filled 2017-11-27: qty 1

## 2017-11-27 MED ORDER — LIDOCAINE HCL (CARDIAC) PF 100 MG/5ML IV SOSY
PREFILLED_SYRINGE | INTRAVENOUS | Status: DC | PRN
  Administered 2017-11-27: 60 mg via INTRAVENOUS

## 2017-11-27 MED ORDER — PROPOFOL 10 MG/ML IV BOLUS
INTRAVENOUS | Status: AC
  Filled 2017-11-27: qty 20

## 2017-11-27 MED ORDER — FENTANYL CITRATE (PF) 100 MCG/2ML IJ SOLN: 25.0000 ug | INTRAMUSCULAR | Status: DC | PRN

## 2017-11-27 MED ORDER — KETOROLAC TROMETHAMINE 30 MG/ML IJ SOLN: 30.0000 mg | Freq: Once | INTRAMUSCULAR | Status: DC | PRN

## 2017-11-27 MED ORDER — BUPIVACAINE HCL (PF) 0.5 % IJ SOLN
INTRAMUSCULAR | Status: AC
  Filled 2017-11-27: qty 30

## 2017-11-27 MED ORDER — PROPOFOL 10 MG/ML IV BOLUS
INTRAVENOUS | Status: DC | PRN
  Administered 2017-11-27: 140 mg via INTRAVENOUS

## 2017-11-27 MED ORDER — BUPIVACAINE HCL 0.5 % IJ SOLN
INTRAMUSCULAR | Status: DC | PRN
  Administered 2017-11-27: 30 mL

## 2017-11-27 MED ORDER — MEPERIDINE HCL 25 MG/ML IJ SOLN: 6.2500 mg | INTRAMUSCULAR | Status: DC | PRN

## 2017-11-27 MED ORDER — FENTANYL CITRATE (PF) 250 MCG/5ML IJ SOLN
INTRAMUSCULAR | Status: AC
  Filled 2017-11-27: qty 5

## 2017-11-27 MED ORDER — OXYCODONE HCL 5 MG PO TABS: 5.0000 mg | ORAL_TABLET | Freq: Once | ORAL | Status: DC | PRN

## 2017-11-27 MED ORDER — IBUPROFEN 600 MG PO TABS: 600.0000 mg | ORAL_TABLET | Freq: Four times a day (QID) | ORAL | 2 refills | Status: DC | PRN

## 2017-11-27 MED ORDER — KETOROLAC TROMETHAMINE 30 MG/ML IJ SOLN
INTRAMUSCULAR | Status: AC
  Filled 2017-11-27: qty 1

## 2017-11-27 MED ORDER — OXYCODONE HCL 5 MG/5ML PO SOLN: 5.0000 mg | Freq: Once | ORAL | Status: DC | PRN

## 2017-11-27 MED ORDER — LACTATED RINGERS IV SOLN
INTRAVENOUS | Status: DC
  Administered 2017-11-27: 125 mL/h via INTRAVENOUS

## 2017-11-27 MED ORDER — KETOROLAC TROMETHAMINE 30 MG/ML IJ SOLN
INTRAMUSCULAR | Status: DC | PRN
  Administered 2017-11-27: 30 mg via INTRAVENOUS

## 2017-11-27 MED ORDER — ONDANSETRON HCL 4 MG/2ML IJ SOLN: 4.0000 mg | Freq: Once | INTRAMUSCULAR | Status: DC | PRN

## 2017-11-27 MED ORDER — SCOPOLAMINE 1 MG/3DAYS TD PT72
1.0000 | MEDICATED_PATCH | Freq: Once | TRANSDERMAL | Status: DC
  Administered 2017-11-27: 1.5 mg via TRANSDERMAL

## 2017-11-27 MED ORDER — IBUPROFEN 200 MG PO TABS
200.0000 mg | ORAL_TABLET | Freq: Four times a day (QID) | ORAL | Status: DC | PRN
  Filled 2017-11-27: qty 2

## 2017-11-27 MED ORDER — FENTANYL CITRATE (PF) 100 MCG/2ML IJ SOLN
INTRAMUSCULAR | Status: DC | PRN
  Administered 2017-11-27 (×2): 50 ug via INTRAVENOUS

## 2017-11-27 MED ORDER — DEXAMETHASONE SODIUM PHOSPHATE 10 MG/ML IJ SOLN
INTRAMUSCULAR | Status: DC | PRN
  Administered 2017-11-27: 10 mg via INTRAVENOUS

## 2017-11-27 MED ORDER — MIDAZOLAM HCL 2 MG/2ML IJ SOLN
INTRAMUSCULAR | Status: AC
  Filled 2017-11-27: qty 2

## 2017-11-27 MED ORDER — SCOPOLAMINE 1 MG/3DAYS TD PT72
MEDICATED_PATCH | TRANSDERMAL | Status: AC
  Administered 2017-11-27: 1.5 mg via TRANSDERMAL
  Filled 2017-11-27: qty 1

## 2017-11-27 MED ORDER — MIDAZOLAM HCL 2 MG/2ML IJ SOLN
INTRAMUSCULAR | Status: DC | PRN
  Administered 2017-11-27: 2 mg via INTRAVENOUS

## 2017-11-27 MED ORDER — ONDANSETRON HCL 4 MG/2ML IJ SOLN
INTRAMUSCULAR | Status: DC | PRN
  Administered 2017-11-27: 4 mg via INTRAVENOUS

## 2017-11-27 MED ORDER — 0.9 % SODIUM CHLORIDE (POUR BTL) OPTIME
TOPICAL | Status: DC | PRN
  Administered 2017-11-27: 1000 mL

## 2017-11-27 MED ORDER — LIDOCAINE HCL (CARDIAC) PF 100 MG/5ML IV SOSY
PREFILLED_SYRINGE | INTRAVENOUS | Status: AC
  Filled 2017-11-27: qty 5

## 2017-11-27 MED ORDER — IBUPROFEN 100 MG/5ML PO SUSP
200.0000 mg | Freq: Four times a day (QID) | ORAL | Status: DC | PRN
  Filled 2017-11-27: qty 20

## 2017-11-27 SURGICAL SUPPLY — 18 items
CATH ROBINSON RED A/P 16FR (CATHETERS) ×3 IMPLANT
DECANTER SPIKE VIAL GLASS SM (MISCELLANEOUS) ×3 IMPLANT
GLOVE BIOGEL PI IND STRL 7.0 (GLOVE) ×1 IMPLANT
GLOVE BIOGEL PI INDICATOR 7.0 (GLOVE) ×2
GLOVE ECLIPSE 7.0 STRL STRAW (GLOVE) ×3 IMPLANT
GOWN STRL REUS W/TWL LRG LVL3 (GOWN DISPOSABLE) ×6 IMPLANT
KIT BERKELEY 1ST TRIMESTER 3/8 (MISCELLANEOUS) ×3 IMPLANT
NS IRRIG 1000ML POUR BTL (IV SOLUTION) ×3 IMPLANT
PACK VAGINAL MINOR WOMEN LF (CUSTOM PROCEDURE TRAY) ×3 IMPLANT
PAD OB MATERNITY 4.3X12.25 (PERSONAL CARE ITEMS) ×3 IMPLANT
PAD PREP 24X48 CUFFED NSTRL (MISCELLANEOUS) ×3 IMPLANT
SET BERKELEY SUCTION TUBING (SUCTIONS) ×3 IMPLANT
TOWEL OR 17X24 6PK STRL BLUE (TOWEL DISPOSABLE) ×6 IMPLANT
VACURETTE 10 RIGID CVD (CANNULA) IMPLANT
VACURETTE 6 ASPIR F TIP BERK (CANNULA) IMPLANT
VACURETTE 7MM CVD STRL WRAP (CANNULA) IMPLANT
VACURETTE 8 RIGID CVD (CANNULA) ×3 IMPLANT
VACURETTE 9 RIGID CVD (CANNULA) IMPLANT

## 2017-11-27 NOTE — H&P (Signed)
Preoperative History and Physical  [name] [name] is a 37 y.o. [id number] here for surgical management of missed abortion measuring about [redacted] weeks gestation.   Declined medical management. No significant preoperative concerns.  Proposed surgery: Dilation and Evacuation  Past Medical History:  Diagnosis Date  . Anemia   . Depression   . Headache   . Hypertension    with pregnancy  . Kidney infection 2009   Past Surgical History:  Procedure Laterality Date  . FEMUR FRACTURE SURGERY Left    metal rod  . LEG SURGERY Left 1999   steel rod in left femur bone    OB History  Gravida Para Term Preterm AB Living  4 1 1   2 1   SAB TAB Ectopic Multiple Live Births  2     0 1    # Outcome Date GA Lbr Len/2nd Weight Sex Delivery Anes PTL Lv  4 Current           3 Term 11/16/15 [redacted]w[redacted]d 02:27 / 00:26 2950 g M Vag-Spont None  LIV  2 SAB           1 SAB           Patient denies any other pertinent gynecologic issues.   No current facility-administered medications on file prior to encounter.    Current Outpatient Medications on File Prior to Encounter  Medication Sig Dispense Refill  . misoprostol (CYTOTEC) 200 MCG tablet Take 4 tablets (800 mcg total) by mouth once for 1 dose. Place in cheek and let dissolve before swallowing. 3 tablet 0  . Prenatal Vit-Fe Fumarate-FA (MULTIVITAMIN-PRENATAL) 27-0.8 MG TABS tablet Take 1 tablet by mouth daily at 12 noon.    . promethazine (PHENERGAN) 25 MG tablet Take 0.5-1 tablets (12.5-25 mg total) by mouth every 6 (six) hours as needed for nausea. 30 tablet 0  . oxyCODONE-acetaminophen (PERCOCET/ROXICET) 5-325 MG tablet Take 1 tablet by mouth every 6 (six) hours as needed for severe pain. 10 tablet 0  . phenylephrine (NEO-SYNEPHRINE) 1 % nasal spray Place 1 drop into both nostrils every 6 (six) hours as needed for congestion. 30 mL 0   Allergies  Allergen Reactions  . Bactrim [Sulfamethoxazole-Trimethoprim] Hives and Swelling  . Penicillins     Fever-pt was  really sick. Has patient had a PCN reaction causing immediate rash, facial/tongue/throat swelling, SOB or lightheadedness with hypotension: No Has patient had a PCN reaction causing severe rash involving mucus membranes or skin necrosis: No Has patient had a PCN reaction that required hospitalization Yes Has patient had a PCN reaction occurring within the last 10 years: No If all of the above answers are "NO", then may proceed with Cephalosporin use.     Social History:   reports that she has never smoked. She has never used smokeless tobacco. She reports that she drank alcohol. She reports that she does not use drugs.  Family History  Problem Relation Age of Onset  . Hyperlipidemia Mother   . Heart disease Maternal Aunt   . Heart disease Maternal Uncle   . Heart disease Paternal Aunt   . Heart disease Maternal Grandfather   . Heart disease Paternal Grandfather     Review of Systems: Pertinent items noted in HPI and remainder of comprehensive ROS otherwise negative.  PHYSICAL EXAM: Blood pressure 122/61, pulse 60, temperature 98.1 F (36.7 C), temperature source Oral, resp. rate 16, height 5\' 1"  (1.549 m), weight 72.6 kg, last menstrual period 09/15/2017, SpO2 100 %. CONSTITUTIONAL: Well-developed, well-nourished  female in no acute distress.  HENT:  Normocephalic, atraumatic, External right and left ear normal. Oropharynx is clear and moist EYES: Conjunctivae and EOM are normal. Pupils are equal, round, and reactive to light. No scleral icterus.  NECK: Normal range of motion, supple, no masses SKIN: Skin is warm and dry. No rash noted. Not diaphoretic. No erythema. No pallor. NEUROLOGIC: Alert and oriented to person, place, and time. Normal reflexes, muscle tone coordination. No cranial nerve deficit noted. PSYCHIATRIC: Normal mood and affect. Normal behavior. Normal judgment and thought content. CARDIOVASCULAR: Normal heart rate noted, regular rhythm RESPIRATORY: Effort and breath  sounds normal, no problems with respiration noted ABDOMEN: Soft, nontender, nondistended. PELVIC: Deferred MUSCULOSKELETAL: Normal range of motion. No edema and no tenderness. 2+ distal pulses.  Labs: Results for orders placed or performed during the hospital encounter of 11/27/17 (from the past 336 hour(s))  CBC   Collection Time: 11/27/17  8:08 AM  Result Value Ref Range   WBC 4.5 4.0 - 10.5 K/uL   RBC 3.91 3.87 - 5.11 MIL/uL   Hemoglobin 11.8 (L) 12.0 - 15.0 g/dL   HCT 34.5 (L) 36.0 - 46.0 %   MCV 88.2 78.0 - 100.0 fL   MCH 30.2 26.0 - 34.0 pg   MCHC 34.2 30.0 - 36.0 g/dL   RDW 12.8 11.5 - 15.5 %   Platelets 269 150 - 400 K/uL  Results for orders placed or performed during the hospital encounter of 11/24/17 (from the past 336 hour(s))  GC/Chlamydia probe amp ([place])not at [facility]   Collection Time: 11/24/17 12:00 AM  Result Value Ref Range   Chlamydia Negative    Neisseria gonorrhea Negative   Urinalysis, Routine w reflex microscopic   Collection Time: 11/24/17  4:48 PM  Result Value Ref Range   Color, Urine YELLOW YELLOW   APPearance HAZY (A) CLEAR   Specific Gravity, Urine 1.012 1.005 - 1.030   pH 6.0 5.0 - 8.0   Glucose, UA NEGATIVE NEGATIVE mg/dL   Hgb urine dipstick LARGE (A) NEGATIVE   Bilirubin Urine NEGATIVE NEGATIVE   Ketones, ur 20 (A) NEGATIVE mg/dL   Protein, ur NEGATIVE NEGATIVE mg/dL   Nitrite NEGATIVE NEGATIVE   Leukocytes, UA SMALL (A) NEGATIVE   RBC / HPF 11-20 0 - 5 RBC/hpf   WBC, UA 6-10 0 - 5 WBC/hpf   Bacteria, UA FEW (A) NONE SEEN   Squamous Epithelial / LPF 0-5 0 - 5   Mucus PRESENT   CBC   Collection Time: 11/24/17  5:57 PM  Result Value Ref Range   WBC 6.4 4.0 - 10.5 K/uL   RBC 4.09 3.87 - 5.11 MIL/uL   Hemoglobin 12.2 12.0 - 15.0 g/dL   HCT 35.6 (L) 36.0 - 46.0 %   MCV 87.0 78.0 - 100.0 fL   MCH 29.8 26.0 - 34.0 pg   MCHC 34.3 30.0 - 36.0 g/dL   RDW 12.6 11.5 - 15.5 %   Platelets 291 150 - 400 K/uL  hCG, quantitative, pregnancy    Collection Time: 11/24/17  5:57 PM  Result Value Ref Range   hCG, Beta Chain, Quant, S 129,519 (H) <5 mIU/mL  HIV antibody   Collection Time: 11/24/17  5:57 PM  Result Value Ref Range   HIV Screen 4th Generation wRfx Non Reactive Non Reactive  Wet prep, genital   Collection Time: 11/24/17  6:11 PM  Result Value Ref Range   Yeast Wet Prep HPF POC NONE SEEN NONE SEEN   Trich,  Wet Prep NONE SEEN NONE SEEN   Clue Cells Wet Prep HPF POC NONE SEEN NONE SEEN   WBC, Wet Prep HPF POC MANY (A) NONE SEEN   Sperm NONE SEEN     Imaging Studies: US Ob Comp Less 14 Wks  Result Date: 11/24/2017 CLINICAL DATA:  Pregnant, bleeding; no quantitative beta HCG for correlation; EGA [redacted] weeks 0 days by LMP EXAM: OBSTETRIC <14 WK [place] AND TRANSVAGINAL OB US TECHNIQUE: Both transabdominal and transvaginal ultrasound examinations were performed for complete evaluation of the gestation as well as the maternal uterus, adnexal regions, and pelvic cul-de-sac. Transvaginal technique was performed to assess early pregnancy. COMPARISON:  None. FINDINGS: Intrauterine gestational sac: Present, single Yolk sac:  Present Embryo:  Present Cardiac Activity: None detected Heart Rate: N/A  bpm CRL:  17.8 mm   8 w   1 d                  [place] EDC: 07/05/2018 Subchorionic hemorrhage:  None identified Maternal uterus/adnexae: Small intramural leiomyomas 10 x 8 x 12 mm and 6 x 5 x 7 mm. RIGHT ovary normal size and morphology, 3.3 x 0.9 x 0.8 cm. LEFT ovary measures 2.3 x 3.2 x 2.1 cm and contains a small complex corpus luteum. No free pelvic fluid or adnexal masses. IMPRESSION: Single intrauterine gestation identified with a fetal pole measuring 17.8 mm length. No fetal cardiac activity detected. Findings meet definitive criteria for failed pregnancy. This follows SRU consensus guidelines: Diagnostic Criteria for Nonviable Pregnancy Early in the First Trimester. Alison Stalling J Med [id number]. Electronically Signed   By: Lavonia Dana M.D.   On:  11/24/2017 19:05   US Ob Transvaginal  Result Date: 11/24/2017 CLINICAL DATA:  Pregnant, bleeding; no quantitative beta HCG for correlation; EGA [redacted] weeks 0 days by LMP EXAM: OBSTETRIC <14 WK [place] AND TRANSVAGINAL OB US TECHNIQUE: Both transabdominal and transvaginal ultrasound examinations were performed for complete evaluation of the gestation as well as the maternal uterus, adnexal regions, and pelvic cul-de-sac. Transvaginal technique was performed to assess early pregnancy. COMPARISON:  None. FINDINGS: Intrauterine gestational sac: Present, single Yolk sac:  Present Embryo:  Present Cardiac Activity: None detected Heart Rate: N/A  bpm CRL:  17.8 mm   8 w   1 d                  [place] EDC: 07/05/2018 Subchorionic hemorrhage:  None identified Maternal uterus/adnexae: Small intramural leiomyomas 10 x 8 x 12 mm and 6 x 5 x 7 mm. RIGHT ovary normal size and morphology, 3.3 x 0.9 x 0.8 cm. LEFT ovary measures 2.3 x 3.2 x 2.1 cm and contains a small complex corpus luteum. No free pelvic fluid or adnexal masses. IMPRESSION: Single intrauterine gestation identified with a fetal pole measuring 17.8 mm length. No fetal cardiac activity detected. Findings meet definitive criteria for failed pregnancy. This follows SRU consensus guidelines: Diagnostic Criteria for Nonviable Pregnancy Early in the First Trimester. Alison Stalling J Med [phone]. Electronically Signed   By: Lavonia Dana M.D.   On: 11/24/2017 19:05    Assessment: Principal Problem:   Missed abortion ~ [redacted] weeks GA  Plan: Patient will undergo surgical management with Dilation and Evacuation.  Risks of surgery including bleeding, infection, injury to surrounding organs, need for additional procedures, possibility of intrauterine scarring which may impair future fertility, risk of retained products which may require further management and other postoperative/anesthesia complications were explained to patient.  Likelihood of success  of complete evacuation of the  uterus was discussed with the patient.  Written informed consent was obtained.  Patient has been NPO since midnight and she will remain NPO for procedure. Anesthesia and OR aware.  Preoperative prophylactic Doxycycline 200mg  IV  has been ordered and is on call to the OR.  To OR when ready.     Verita Schneiders, MD, [place] for [employer], [place]

## 2017-11-27 NOTE — Anesthesia Procedure Notes (Signed)
Procedure Name: LMA Insertion Date/Time: 11/27/2017 9:10 AM Performed by: Georgeanne Nim, CRNA Pre-anesthesia Checklist: Patient identified, Emergency Drugs available, Suction available, Patient being monitored and Timeout performed Patient Re-evaluated:Patient Re-evaluated prior to induction Oxygen Delivery Method: Circle system utilized Preoxygenation: Pre-oxygenation with 100% oxygen Induction Type: IV induction Ventilation: Mask ventilation without difficulty LMA: LMA inserted LMA Size: 4.0 Number of attempts: 1 Placement Confirmation: positive ETCO2,  CO2 detector and breath sounds checked- equal and bilateral Tube secured with: Tape Dental Injury: Teeth and Oropharynx as per pre-operative assessment

## 2017-11-27 NOTE — Discharge Instructions (Signed)
Dilation and Curettage or Vacuum Curettage, Care After These instructions give you information about caring for yourself after your procedure. Your doctor may also give you more specific instructions. Call your doctor if you have any problems or questions after your procedure. Follow these instructions at home: Activity  Do not drive or use heavy machinery while taking prescription pain medicine.  For 24 hours after your procedure, avoid driving.  Take short walks often, followed by rest periods. Ask your doctor what activities are safe for you. After one or two days, you may be able to return to your normal activities.  Do not lift anything that is heavier than 10 lb (4.5 kg) until your doctor approves.  For at least 2 weeks, or as long as told by your doctor: ? Do not douche. ? Do not use tampons. ? Do not have sex. General instructions  Take over-the-counter and prescription medicines only as told by your doctor. This is very important if you take blood thinning medicine.  Do not take baths, swim, or use a hot tub until your doctor approves. Take showers instead of baths.  Wear compression stockings as told by your doctor.  It is up to you to get the results of your procedure. Ask your doctor when your results will be ready.  Keep all follow-up visits as told by your doctor. This is important. Contact a doctor if:  You have very bad cramps that get worse or do not get better with medicine.  You have very bad pain in your belly (abdomen).  You cannot drink fluids without throwing up (vomiting).  You get pain in a different part of the area between your belly and thighs (pelvis).  You have bad-smelling discharge from your vagina.  You have a rash. Get help right away if:  You are bleeding a lot from your vagina. A lot of bleeding means soaking more than one sanitary pad in an hour, for 2 hours in a row.  You have clumps of blood (blood clots) coming from your  vagina.  You have a fever or chills.  Your belly feels very tender or hard.  You have chest pain.  You have trouble breathing.  You cough up blood.  You feel dizzy.  You feel light-headed.  You pass out (faint).  You have pain in your neck or shoulder area. Summary  Take short walks often, followed by rest periods. Ask your doctor what activities are safe for you. After one or two days, you may be able to return to your normal activities.  Do not lift anything that is heavier than 10 lb (4.5 kg) until your doctor approves.  Do not take baths, swim, or use a hot tub until your doctor approves. Take showers instead of baths.  Contact your doctor if you have any symptoms of infection, like bad-smelling discharge from your vagina. This information is not intended to replace advice given to you by your health care provider. Make sure you discuss any questions you have with your health care provider. Document Released: 12/12/2007 Document Revised: 11/20/2015 Document Reviewed: 11/20/2015 Elsevier Interactive Patient Education  2017 [employer] [facility] Home Care Instructions  NO IBUPROFEN PRODUCTS UNTIL: 3:00 PM TODAY  Activity: Get plenty of rest for the remainder of the day. A responsible individual must stay with you for 24 hours following the procedure.  For the next 24 hours, DO NOT: -Drive a car -Paediatric nurse -Drink alcoholic beverages -Take any medication unless instructed  by your physician -Make any legal decisions or sign important papers.  Meals: Start with liquid foods such as gelatin or soup. Progress to regular foods as tolerated. Avoid greasy, spicy, heavy foods. If nausea and/or vomiting occur, drink only clear liquids until the nausea and/or vomiting subsides. Call your physician if vomiting continues.  Special Instructions/Symptoms: Your throat may feel dry or sore from the anesthesia or the breathing tube placed in your throat during  surgery. If this causes discomfort, gargle with warm salt water. The discomfort should disappear within 24 hours.  If you had a scopolamine patch placed behind your ear for the management of post- operative nausea and/or vomiting:  1. The medication in the patch is effective for 72 hours, after which it should be removed.  Wrap patch in a tissue and discard in the trash. Wash hands thoroughly with soap and water. 2. You may remove the patch earlier than 72 hours if you experience unpleasant side effects which may include dry mouth, dizziness or visual disturbances. 3. Avoid touching the patch. Wash your hands with soap and water after contact with the patch.

## 2017-11-27 NOTE — Anesthesia Preprocedure Evaluation (Signed)
Anesthesia Evaluation  Patient identified by MRN, date of birth, ID band Patient awake    Reviewed: Allergy & Precautions, H&P , NPO status , Patient's Chart, lab work & pertinent test results  Airway Mallampati: I  TM Distance: >3 FB Neck ROM: full    Dental no notable dental hx. (+) Teeth Intact   Pulmonary neg pulmonary ROS,    Pulmonary exam normal breath sounds clear to auscultation       Cardiovascular hypertension, negative cardio ROS Normal cardiovascular exam Rhythm:regular Rate:Normal     Neuro/Psych negative neurological ROS     GI/Hepatic negative GI ROS, Neg liver ROS,   Endo/Other  negative endocrine ROS  Renal/GU      Musculoskeletal   Abdominal Normal abdominal exam  (+)   Peds  Hematology negative hematology ROS (+)   Anesthesia Other Findings   Reproductive/Obstetrics (+) Pregnancy                             Anesthesia Physical Anesthesia Plan  ASA: II  Anesthesia Plan: General   Post-op Pain Management:    Induction: Intravenous  PONV Risk Score and Plan:   Airway Management Planned: LMA  Additional Equipment:   Intra-op Plan:   Post-operative Plan: Extubation in OR  Informed Consent: I have reviewed the patients History and Physical, chart, labs and discussed the procedure including the risks, benefits and alternatives for the proposed anesthesia with the patient or authorized representative who has indicated his/her understanding and acceptance.   Dental advisory given  Plan Discussed with: CRNA and Surgeon  Anesthesia Plan Comments:         Anesthesia Quick Evaluation

## 2017-11-27 NOTE — Op Note (Signed)
[place] PROCEDURE DATE: 11/27/2017  PREOPERATIVE DIAGNOSIS: 8 week missed abortion POSTOPERATIVE DIAGNOSIS: The same PROCEDURE:  Dilation and Evacuation SURGEON:  Dr. Verita Schneiders  INDICATIONS: 37 y.o. [id number] with MAB at [redacted] weeks gestation, needing surgical completion.  Risks of surgery were discussed with the patient including but not limited to: bleeding which may require transfusion; infection which may require antibiotics; injury to uterus or surrounding organs; need for additional procedures including laparotomy or laparoscopy; possibility of intrauterine scarring which may impair future fertility; and other postoperative/anesthesia complications. Written informed consent was obtained.    FINDINGS:  A 8 week size uterus, moderate amounts of products of conception, specimen sent to pathology.  ANESTHESIA:    Monitored intravenous sedation, paracervical block. ESTIMATED BLOOD LOSS:  50 ml. SPECIMENS:  Products of conception sent to pathology COMPLICATIONS:  None immediate.  PROCEDURE DETAILS:  The patient received intravenous Doxycycline while in the preoperative area.  She was then taken to the operating room where monitored intravenous sedation was administered and was found to be adequate.  After an adequate timeout was performed, she was placed in the dorsal lithotomy position and examined; then prepped and draped in the sterile manner.   Her bladder was catheterized for an unmeasured amount of clear, yellow urine. A vaginal speculum was then placed in the patient's vagina and a single tooth tenaculum was applied to the anterior lip of the cervix.  A paracervical block using 30 ml of 0.5% Marcaine was administered. The cervix was gently dilated to accommodate a 8 mm suction curette that was gently advanced to the uterine fundus.  The suction device was then activated and curette slowly rotated to clear the uterus of products of conception.  A sharp curettage was then performed to  confirm complete emptying of the uterus. There was minimal bleeding noted and the tenaculum removed with good hemostasis noted.   All instruments were removed from the patient's vagina.  Sponge and instrument counts were correct times two  The patient tolerated the procedure well and was taken to the recovery area awake, and in stable condition.  The patient will be discharged to home as per PACU criteria.  Routine postoperative instructions given.  She was prescribed Percocet, Ibuprofen and Colace.  She will follow up in the clinic in about 2 weeks for postoperative evaluation.   Verita Schneiders, MD, [place], [facility]

## 2017-11-27 NOTE — Transfer of Care (Signed)
Immediate Anesthesia Transfer of Care Note  Patient: [name] [name]  Procedure(s) Performed: DILATATION AND EVACUATION (N/A )  Patient Location: PACU  Anesthesia Type:General  Level of Consciousness: sedated  Airway & Oxygen Therapy: Patient Spontanous Breathing and Patient connected to nasal cannula oxygen  Post-op Assessment: Report given to RN  Post vital signs: Reviewed and stable  Last Vitals:  Vitals Value Taken Time  BP 135/77 11/27/2017  9:43 AM  Temp    Pulse 69 11/27/2017  9:44 AM  Resp 20 11/27/2017  9:44 AM  SpO2 100 % 11/27/2017  9:44 AM  Vitals shown include unvalidated device data.  Last Pain:  Vitals:   11/27/17 0813  TempSrc: Oral  PainSc: 0-No pain      Patients Stated Pain Goal: 4 (58/85/02 7741)  Complications: No apparent anesthesia complications

## 2017-11-28 NOTE — Anesthesia Postprocedure Evaluation (Signed)
Anesthesia Post Note  Patient: [name] [name]  Procedure(s) Performed: DILATATION AND EVACUATION (N/A )     Patient location during evaluation: PACU Anesthesia Type: General Level of consciousness: awake Pain management: pain level controlled Vital Signs Assessment: post-procedure vital signs reviewed and stable Respiratory status: spontaneous breathing Cardiovascular status: stable Postop Assessment: no apparent nausea or vomiting Anesthetic complications: no    Last Vitals:  Vitals:   11/27/17 1030 11/27/17 1120  BP: 113/74 134/70  Pulse: (!) 57 98  Resp: 11 16  Temp: 36.8 C 36.7 C  SpO2: 99% 100%    Last Pain:  Vitals:   11/27/17 1120  TempSrc:   PainSc: 0-No pain   Pain Goal: Patients Stated Pain Goal: 4 (11/27/17 1030)               [place]

## 2017-11-29 ENCOUNTER — Encounter (HOSPITAL_COMMUNITY): Payer: Self-pay | Admitting: Obstetrics & Gynecology

## 2017-12-02 ENCOUNTER — Encounter: Payer: Medicaid Other | Admitting: Family Medicine

## 2017-12-08 ENCOUNTER — Encounter: Payer: Self-pay | Admitting: Obstetrics & Gynecology

## 2017-12-08 ENCOUNTER — Ambulatory Visit (INDEPENDENT_AMBULATORY_CARE_PROVIDER_SITE_OTHER): Payer: Medicaid Other | Admitting: Obstetrics & Gynecology

## 2017-12-08 VITALS — BP 121/78 | HR 71 | Wt 161.2 lb

## 2017-12-08 DIAGNOSIS — R399 Unspecified symptoms and signs involving the genitourinary system: Secondary | ICD-10-CM

## 2017-12-08 DIAGNOSIS — Z09 Encounter for follow-up examination after completed treatment for conditions other than malignant neoplasm: Secondary | ICD-10-CM

## 2017-12-08 LAB — POCT URINALYSIS DIPSTICK
Glucose, UA: NEGATIVE
KETONES UA: NEGATIVE
LEUKOCYTES UA: NEGATIVE
NITRITE UA: NEGATIVE
PH UA: 8 (ref 5.0–8.0)
PROTEIN UA: NEGATIVE
SPEC GRAV UA: 1.01 (ref 1.010–1.025)
UROBILINOGEN UA: 0.2 U/dL

## 2017-12-08 NOTE — Patient Instructions (Signed)
Return to clinic for any scheduled appointments or for any gynecologic concerns as needed.   

## 2017-12-08 NOTE — Progress Notes (Signed)
Subjective:     [name] [name] is a 37 y.o. [phone] female who presents to the clinic 1.5 weeks status post D&E for MAB measurign about [redacted] weeks GA. Eating a regular diet without difficulty. Bowel movements are normal. The patient is having some mild dysuria, wants to be checked for UTI. No abdominal pain or back pain, no fevers..  She does report feeling sad and being "weepy" and feeling overall "weird", she is willing to talk to a counselor about her feelings.   The following portions of the patient's history were reviewed and updated as appropriate: allergies, current medications, past family history, past medical history, past social history, past surgical history and problem list. Normal pap on 05/02/15 with negative HRHPV.  Review of Systems Pertinent items noted in HPI and remainder of comprehensive ROS otherwise negative.    Objective:    BP 121/78   Pulse 71   Wt 161 lb 3.2 oz (73.1 kg)   LMP 09/15/2017 (Exact Date)   Breastfeeding? 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  Results for orders placed or performed in visit on 12/08/17 (from the past 24 hour(s))  POCT Urinalysis Dipstick     Status: Abnormal   Collection Time: 12/08/17  5:12 PM  Result Value Ref Range   Color, UA Yellow    Clarity, UA Clear    Glucose, UA Negative Negative   Bilirubin, UA Small    Ketones, UA Negative    Spec Grav, UA 1.010 1.010 - 1.025   Blood, UA moderate    pH, UA 8.0 5.0 - 8.0   Protein, UA Negative Negative   Urobilinogen, UA 0.2 0.2 or 1.0 E.U./dL   Nitrite, UA Negative    Leukocytes, UA Negative Negative   Appearance     Odor      Assessment:    Doing well postoperatively. Operative findings again reviewed. Pathology report discussed.    Plan:   1. Continue any current  medications. 2. Appropriate support given to her. Patient verbally consented to meet with [facility] Clinician about presenting concerns, appointment was made for her. Had a long discussion about mourning this pregnancy loss, and told her to let us know for any concerning depression symptoms. Will follow up [facility] counselor recommendations. 3. Equivocal urine dipstick results, will send for culture and manage accordingly.  Pain also likely secondary to catheter trauma during surgery, will continue to monitor 4. Activity restrictions:  Pelvic rest for 3 weeks after surgery. 5. Anticipated return to work: now. 6. Follow up as needed.   Verita Schneiders, MD, [place] for [employer], [place]

## 2017-12-09 ENCOUNTER — Institutional Professional Consult (permissible substitution): Payer: Medicaid Other

## 2017-12-09 LAB — URINE CULTURE

## 2017-12-15 ENCOUNTER — Institutional Professional Consult (permissible substitution): Payer: Self-pay

## 2017-12-22 ENCOUNTER — Institutional Professional Consult (permissible substitution): Payer: Self-pay

## 2017-12-30 ENCOUNTER — Encounter: Payer: Medicaid Other | Admitting: Family Medicine

## 2018-10-13 ENCOUNTER — Encounter: Payer: Self-pay | Admitting: General Practice

## 2018-10-13 ENCOUNTER — Ambulatory Visit (INDEPENDENT_AMBULATORY_CARE_PROVIDER_SITE_OTHER): Payer: 59 | Admitting: *Deleted

## 2018-10-13 ENCOUNTER — Other Ambulatory Visit: Payer: Self-pay

## 2018-10-13 VITALS — BP 139/84 | HR 67 | Temp 98.3°F | Ht 61.0 in | Wt 160.0 lb

## 2018-10-13 DIAGNOSIS — Z3201 Encounter for pregnancy test, result positive: Secondary | ICD-10-CM

## 2018-10-13 DIAGNOSIS — Z3200 Encounter for pregnancy test, result unknown: Secondary | ICD-10-CM

## 2018-10-13 DIAGNOSIS — Z3480 Encounter for supervision of other normal pregnancy, unspecified trimester: Secondary | ICD-10-CM | POA: Insufficient documentation

## 2018-10-13 LAB — POCT URINE PREGNANCY: Preg Test, Ur: POSITIVE — AB

## 2018-10-13 MED ORDER — PROMETHAZINE HCL 25 MG PO TABS: 25.0000 mg | ORAL_TABLET | Freq: Four times a day (QID) | ORAL | 0 refills | Status: DC | PRN

## 2018-10-13 NOTE — Progress Notes (Signed)
   PRENATAL INTAKE SUMMARY  Ms. Brass presents today New OB Nurse Interview.  OB History    Gravida  5   Para  1   Term  1   Preterm      AB  3   Living  1     SAB  2   TAB      Ectopic      Multiple  0   Live Births  1          I have reviewed the patient's medical, obstetrical, social, and family histories, medications, and available lab results.  SUBJECTIVE She has no unusual complaints and complains of nausea with vomiting for 7 days.  OBJECTIVE Initial nurse interview (New OB)  GENERAL APPEARANCE: alert, well appearing, in no apparent distress, oriented to person, place and time, well hydrated.   ASSESSMENT Normal pregnancy  PLAN Prenatal care- [facility] work completed at physical appointment Phenergan 25 mg Rx sent to pharmacy Ultrasound <14 wks ordered  Derl Barrow, RN

## 2018-10-21 ENCOUNTER — Other Ambulatory Visit: Payer: Self-pay

## 2018-10-21 ENCOUNTER — Ambulatory Visit (HOSPITAL_COMMUNITY)
Admission: RE | Admit: 2018-10-21 | Discharge: 2018-10-21 | Disposition: A | Discharge status: Home / Self Care | Payer: 59 | Source: Ambulatory Visit | Attending: Obstetrics and Gynecology | Admitting: Obstetrics and Gynecology

## 2018-10-21 ENCOUNTER — Encounter: Payer: Self-pay | Admitting: Obstetrics and Gynecology

## 2018-10-21 ENCOUNTER — Ambulatory Visit (INDEPENDENT_AMBULATORY_CARE_PROVIDER_SITE_OTHER): Payer: 59 | Admitting: General Practice

## 2018-10-21 DIAGNOSIS — D259 Leiomyoma of uterus, unspecified: Secondary | ICD-10-CM | POA: Insufficient documentation

## 2018-10-21 DIAGNOSIS — Z3480 Encounter for supervision of other normal pregnancy, unspecified trimester: Secondary | ICD-10-CM

## 2018-10-21 DIAGNOSIS — Z712 Person consulting for explanation of examination or test findings: Secondary | ICD-10-CM

## 2018-10-21 NOTE — Progress Notes (Signed)
Patient presents to office today for viability ultrasound results. Reviewed results with Noni Saupe who finds single living IUP- patient should begin prenatal care.   Informed patient of results, reviewed dating, and provided pictures. Patient plans prenatal care at [facility] office. Patient had no questions today.  Koren Bound RN BSN 10/21/18

## 2018-10-21 NOTE — Progress Notes (Signed)
I agree with the nurses note and plan of care.   Noni Saupe I, NP 10/21/2018 3:12 PM

## 2018-10-31 ENCOUNTER — Inpatient Hospital Stay (HOSPITAL_COMMUNITY)
Admission: AD | Admit: 2018-10-31 | Discharge: 2018-10-31 | Disposition: A | Discharge status: Home / Self Care | Payer: 59 | Source: Ambulatory Visit | Attending: Obstetrics & Gynecology | Admitting: Obstetrics & Gynecology

## 2018-10-31 ENCOUNTER — Other Ambulatory Visit: Payer: Self-pay

## 2018-10-31 ENCOUNTER — Encounter (HOSPITAL_COMMUNITY): Payer: Self-pay | Admitting: *Deleted

## 2018-10-31 DIAGNOSIS — Z3A08 8 weeks gestation of pregnancy: Secondary | ICD-10-CM

## 2018-10-31 DIAGNOSIS — Z3480 Encounter for supervision of other normal pregnancy, unspecified trimester: Secondary | ICD-10-CM

## 2018-10-31 DIAGNOSIS — Z880 Allergy status to penicillin: Secondary | ICD-10-CM | POA: Insufficient documentation

## 2018-10-31 DIAGNOSIS — O209 Hemorrhage in early pregnancy, unspecified: Secondary | ICD-10-CM | POA: Insufficient documentation

## 2018-10-31 DIAGNOSIS — D259 Leiomyoma of uterus, unspecified: Secondary | ICD-10-CM

## 2018-10-31 LAB — URINALYSIS, ROUTINE W REFLEX MICROSCOPIC
Bilirubin Urine: NEGATIVE
Glucose, UA: NEGATIVE mg/dL
Ketones, ur: NEGATIVE mg/dL
Nitrite: NEGATIVE
Protein, ur: 30 mg/dL — AB
RBC / HPF: >50 RBC/hpf — ABNORMAL HIGH (ref 0–5)
Specific Gravity, Urine: 1.024 (ref 1.005–1.030)
pH: 6 (ref 5.0–8.0)

## 2018-10-31 LAB — WET PREP, GENITAL
Clue Cells Wet Prep HPF POC: NONE SEEN
Sperm: NONE SEEN
Trich, Wet Prep: NONE SEEN
Yeast Wet Prep HPF POC: NONE SEEN

## 2018-10-31 NOTE — MAU Provider Note (Addendum)
History     CSN: [id number]  Arrival date and time: 10/31/18 8295   First Provider Initiated Contact with Patient 10/31/18 [phone]      Chief Complaint  Patient presents with  . Abdominal Pain  . Vaginal Bleeding    [name] [name] is a 38 y.o. G5P1031 at [redacted]w[redacted]d who receives care at [facility].  She presents today for Abdominal Pain and Vaginal Bleeding.  She reports that she started having spotting Thursday night and the abdominal pain in the form of cramping started shortly after.  She reports she has not taken any medication for the cramping.  She reports that she has noticed the spotting is bright red and is in the toilet, but that it is only when she uses the restroom and does not require a pad.  She reports that the cramping "does not hurt," but is just a 1/10.  She denies sexual activity in the last 3 days.     OB History    Gravida  5   Para  1   Term  1   Preterm      AB  3   Living  1     SAB  2   TAB      Ectopic      Multiple  0   Live Births  1           Past Medical History:  Diagnosis Date  . Anemia   . Depression   . Headache    Migraines  . Hypertension    with pregnancy  . Kidney infection 2009    Past Surgical History:  Procedure Laterality Date  . DILATION AND EVACUATION N/A 11/27/2017   Procedure: DILATATION AND EVACUATION;  Surgeon: Osborne Oman, MD;  Location: [facility];  Service: Gynecology;  Laterality: N/A;  . FEMUR FRACTURE SURGERY Left    metal rod  . LEG SURGERY Left 1999   steel rod in left femur bone     Family History  Problem Relation Age of Onset  . Hyperlipidemia Mother   . Heart disease Maternal Aunt   . Heart disease Maternal Uncle   . Heart disease Paternal Aunt   . Heart disease Maternal Grandfather   . Heart disease Paternal Grandfather     Social History   Tobacco Use  . Smoking status: Never Smoker  . Smokeless tobacco: Never Used  Substance Use Topics  . Alcohol use: Not Currently   Comment: wine   . Drug use: No    Allergies:  Allergies  Allergen Reactions  . Bactrim [Sulfamethoxazole-Trimethoprim] Hives and Swelling  . Penicillins     Fever-pt was really sick. Has patient had a PCN reaction causing immediate rash, facial/tongue/throat swelling, SOB or lightheadedness with hypotension: No Has patient had a PCN reaction causing severe rash involving mucus membranes or skin necrosis: No Has patient had a PCN reaction that required hospitalization Yes Has patient had a PCN reaction occurring within the last 10 years: No If all of the above answers are "NO", then may proceed with Cephalosporin use.     Medications Prior to Admission  Medication Sig Dispense Refill Last Dose  . Prenatal Vit-Fe Fumarate-FA (MULTIVITAMIN-PRENATAL) 27-0.8 MG TABS tablet Take 1 tablet by mouth daily at 12 noon.   10/30/2018 at Unknown time  . promethazine (PHENERGAN) 25 MG tablet Take 1 tablet (25 mg total) by mouth every 6 (six) hours as needed for nausea or vomiting. 30 tablet 0 Past Week at Unknown time  Review of Systems  Constitutional: Negative for chills and fever.  Respiratory: Negative for cough and shortness of breath.   Gastrointestinal: Positive for abdominal pain and nausea. Negative for constipation, diarrhea and vomiting.  Genitourinary: Positive for vaginal bleeding. Negative for difficulty urinating, dysuria and vaginal discharge.  Neurological: Negative for dizziness, light-headedness and headaches.   Physical Exam   Blood pressure 111/62, pulse 62, temperature 98.7 F (37.1 C), resp. rate 20, height 5\' 1"  (1.549 m), weight 73.3 kg, last menstrual period 09/03/2018.  Physical Exam  Constitutional: She is oriented to person, place, and time. She appears well-developed and well-nourished. No distress.  HENT:  Head: Normocephalic and atraumatic.  Eyes: Conjunctivae are normal.  Neck: Normal range of motion.  Cardiovascular: Normal rate.  Respiratory: Effort  normal.  Genitourinary: Cervix exhibits friability. Cervix exhibits no motion tenderness and no discharge.    Vaginal discharge present.     No vaginal bleeding.  No bleeding in the vagina.    Genitourinary Comments: Speculum Exam: -Normal External Genitalia: Non tender, no apparent discharge at introitus.  -Vaginal Vault: Pink mucosa with good rugae. Small amt brown discharge in vault -wet prep collected -Cervix:Pink, no lesions, cysts, or polyps. [place] appearance, closed. No active bleeding from os-GC/CT collected -Bimanual Exam:  No tenderness.  Uterus size c/w dates   Musculoskeletal: Normal range of motion.  Neurological: She is alert and oriented to person, place, and time.  Skin: Skin is warm and dry.  Psychiatric: She has a normal mood and affect. Her behavior is normal.     MAU Course  Procedures Results for orders placed or performed during the hospital encounter of 10/31/18 (from the past 24 hour(s))  Urinalysis, Routine w reflex microscopic     Status: Abnormal   Collection Time: 10/31/18  6:22 AM  Result Value Ref Range   Color, Urine YELLOW YELLOW   APPearance CLOUDY (A) CLEAR   Specific Gravity, Urine 1.024 1.005 - 1.030   pH 6.0 5.0 - 8.0   Glucose, UA NEGATIVE NEGATIVE mg/dL   Hgb urine dipstick LARGE (A) NEGATIVE   Bilirubin Urine NEGATIVE NEGATIVE   Ketones, ur NEGATIVE NEGATIVE mg/dL   Protein, ur 30 (A) NEGATIVE mg/dL   Nitrite NEGATIVE NEGATIVE   Leukocytes,Ua LARGE (A) NEGATIVE   RBC / HPF >50 (H) 0 - 5 RBC/hpf   WBC, UA 21-50 0 - 5 WBC/hpf   Bacteria, UA RARE (A) NONE SEEN   Squamous Epithelial / LPF 6-10 0 - 5   Mucus PRESENT   Wet prep, genital     Status: Abnormal   Collection Time: 10/31/18  6:50 AM  Result Value Ref Range   Yeast Wet Prep HPF POC NONE SEEN NONE SEEN   Trich, Wet Prep NONE SEEN NONE SEEN   Clue Cells Wet Prep HPF POC NONE SEEN NONE SEEN   WBC, Wet Prep HPF POC FEW (A) NONE SEEN   Sperm NONE SEEN     MDM Pelvic Exam; Wet  Prep and GC/CT Labs: UA Ultrasound Assessment and Plan  38 year old G5P1031 SIUP at 8.2 weeks Vaginal Bleeding  -Exam findings discussed. -Cultures collected and sent. -Offered and declines pain medication.  -BSUS reveals fetus c/w dates and HR 176 -Will await results.  Maryann Conners MSN, CNM 10/31/2018, 6:39 AM   Reassessment (8:02 AM)  -Wet prep returns with insignificant findings. -Results discussed with patient. -Informed that GC/CT will return within 2-3 days. -Instructed to follow up with primary ob as scheduled. -Discussed  pelvic rest for 72 hours after cessation of bleeding. -Patient without questions or concerns. -Encouraged to call or return to MAU if symptoms worsen or with the onset of new symptoms. -Discharged to home in stable condition.  Gavin Pound, MSN, CNM

## 2018-10-31 NOTE — Discharge Instructions (Signed)
Vaginal Bleeding During Pregnancy, First Trimester  A small amount of bleeding from the vagina (spotting) is relatively common during early pregnancy. It usually stops on its own. Various things may cause bleeding or spotting during early pregnancy. Some bleeding may be related to the pregnancy, and some may not. In many cases, the bleeding is normal and is not a problem. However, bleeding can also be a sign of something serious. Be sure to tell your health care provider about any vaginal bleeding right away. Some possible causes of vaginal bleeding during the first trimester include:  Infection or inflammation of the cervix.  Growths (polyps) on the cervix.  Miscarriage or threatened miscarriage.  Pregnancy tissue developing outside of the uterus (ectopic pregnancy).  A mass of tissue developing in the uterus due to an egg being fertilized incorrectly (molar pregnancy). Follow these instructions at home: Activity  Follow instructions from your health care provider about limiting your activity. Ask what activities are safe for you.  If needed, make plans for someone to help with your regular activities.  Do not have sex or orgasms until your health care provider says that this is safe. General instructions  Take over-the-counter and prescription medicines only as told by your health care provider.  Pay attention to any changes in your symptoms.  Do not use tampons or douche.  Write down how many pads you use each day, how often you change pads, and how soaked (saturated) they are.  If you pass any tissue from your vagina, save the tissue so you can show it to your health care provider.  Keep all follow-up visits as told by your health care provider. This is important. Contact a health care provider if:  You have vaginal bleeding during any part of your pregnancy.  You have cramps or labor pains.  You have a fever. Get help right away if:  You have severe cramps in your  back or abdomen.  You pass large clots or a large amount of tissue from your vagina.  Your bleeding increases.  You feel light-headed or weak, or you faint.  You have chills.  You are leaking fluid or have a gush of fluid from your vagina. Summary  A small amount of bleeding (spotting) from the vagina is relatively common during early pregnancy.  Various things may cause bleeding or spotting in early pregnancy.  Be sure to tell your health care provider about any vaginal bleeding right away. This information is not intended to replace advice given to you by your health care provider. Make sure you discuss any questions you have with your health care provider. Document Released: 12/12/2004 Document Revised: 06/23/2018 Document Reviewed: 06/06/2016 Elsevier Patient Education  2020 [employer].

## 2018-10-31 NOTE — MAU Note (Signed)
PT SAYS SHE STARTED SPOTTING ON Thursday NIGHT - BRIGHT RED WHEN WIPING.  CRAMPING STARTED  Thursday  NIGHT - CONSTANT - NO MEDS.

## 2018-11-03 LAB — GC/CHLAMYDIA PROBE AMP (~~LOC~~) NOT AT ARMC
Chlamydia: NEGATIVE
Neisseria Gonorrhea: NEGATIVE

## 2018-11-18 ENCOUNTER — Other Ambulatory Visit (HOSPITAL_COMMUNITY)
Admission: RE | Admit: 2018-11-18 | Discharge: 2018-11-18 | Disposition: A | Discharge status: Home / Self Care | Payer: 59 | Source: Ambulatory Visit | Attending: Advanced Practice Midwife | Admitting: Advanced Practice Midwife

## 2018-11-18 ENCOUNTER — Encounter: Payer: Self-pay | Admitting: Advanced Practice Midwife

## 2018-11-18 ENCOUNTER — Other Ambulatory Visit: Payer: Self-pay

## 2018-11-18 ENCOUNTER — Encounter: Payer: Self-pay | Admitting: General Practice

## 2018-11-18 ENCOUNTER — Ambulatory Visit (INDEPENDENT_AMBULATORY_CARE_PROVIDER_SITE_OTHER): Payer: 59 | Admitting: Advanced Practice Midwife

## 2018-11-18 VITALS — BP 119/74 | HR 76 | Temp 98.0°F | Wt 164.8 lb

## 2018-11-18 DIAGNOSIS — Z3A10 10 weeks gestation of pregnancy: Secondary | ICD-10-CM

## 2018-11-18 DIAGNOSIS — O09291 Supervision of pregnancy with other poor reproductive or obstetric history, first trimester: Secondary | ICD-10-CM

## 2018-11-18 DIAGNOSIS — Z3480 Encounter for supervision of other normal pregnancy, unspecified trimester: Secondary | ICD-10-CM | POA: Diagnosis not present

## 2018-11-18 DIAGNOSIS — D259 Leiomyoma of uterus, unspecified: Secondary | ICD-10-CM

## 2018-11-18 DIAGNOSIS — O3411 Maternal care for benign tumor of corpus uteri, first trimester: Secondary | ICD-10-CM

## 2018-11-18 DIAGNOSIS — O09299 Supervision of pregnancy with other poor reproductive or obstetric history, unspecified trimester: Secondary | ICD-10-CM | POA: Insufficient documentation

## 2018-11-18 MED ORDER — ASPIRIN EC 81 MG PO TBEC: 81.0000 mg | DELAYED_RELEASE_TABLET | Freq: Every day | ORAL | 11 refills | Status: DC

## 2018-11-18 NOTE — Progress Notes (Signed)
Subjective:   [name] [name] is a 38 y.o. [id number] at 32w6dby LMP, early ultrasound being seen today for her first obstetrical visit.  Her obstetrical history is significant for pre-eclampsia. Patient does intend to breast feed. Pregnancy history fully reviewed.  Patient reports no complaints.  HISTORY: OB History  Gravida Para Term Preterm AB Living  '5 1 1 ' 0 3 1  SAB TAB Ectopic Multiple Live Births  2 0 0 0 1    # Outcome Date GA Lbr Len/2nd Weight Sex Delivery Anes PTL Lv  5 Current           4 AB 11/27/17 [redacted]w[redacted]d  SAB        Birth Comments: D&E for MAB  3 Term 11/16/15 3880w4d:27 / 00:26 6 lb 8.1 oz (2.95 kg) M Vag-Spont None  LIV     Name: Veazie,BOY KEN[place]und[place]  Apgar1: 8  Apgar5: 9  2 SAB           1 SAB             Last pap smear was done 2017 and was normal  Past Medical History:  Diagnosis Date  . Anemia   . Depression   . Headache    Migraines  . Hypertension    with pregnancy  . Kidney infection 2009   Past Surgical History:  Procedure Laterality Date  . DILATION AND EVACUATION N/A 11/27/2017   Procedure: DILATATION AND EVACUATION;  Surgeon: AnyOsborne OmanD;  Location: [facility];  Service: Gynecology;  Laterality: N/A;  . FEMUR FRACTURE SURGERY Left    metal rod  . LEG SURGERY Left 1999   steel rod in left femur bone    Family History  Problem Relation Age of Onset  . Hyperlipidemia Mother   . Heart disease Maternal Aunt   . Heart disease Maternal Uncle   . Heart disease Paternal Aunt   . Heart disease Maternal Grandfather   . Heart disease Paternal Grandfather    Social History   Tobacco Use  . Smoking status: Never Smoker  . Smokeless tobacco: Never Used  Substance Use Topics  . Alcohol use: Not Currently    Comment: wine   . Drug use: No   Allergies  Allergen Reactions  . Bactrim [Sulfamethoxazole-Trimethoprim] Hives and Swelling  . Penicillins     Fever-pt was really sick. Has patient had a PCN reaction causing immediate rash,  facial/tongue/throat swelling, SOB or lightheadedness with hypotension: No Has patient had a PCN reaction causing severe rash involving mucus membranes or skin necrosis: No Has patient had a PCN reaction that required hospitalization Yes Has patient had a PCN reaction occurring within the last 10 years: No If all of the above answers are "NO", then may proceed with Cephalosporin use.    Current Outpatient Medications on File Prior to Visit  Medication Sig Dispense Refill  . Prenatal Vit-Fe Fumarate-FA (MULTIVITAMIN-PRENATAL) 27-0.8 MG TABS tablet Take 1 tablet by mouth daily at 12 noon.    . promethazine (PHENERGAN) 25 MG tablet Take 1 tablet (25 mg total) by mouth every 6 (six) hours as needed for nausea or vomiting. 30 tablet 0   No current facility-administered medications on file prior to visit.     Review of Systems Pertinent items noted in HPI and remainder of comprehensive ROS otherwise negative.  Exam   Vitals:   11/18/18 0932  BP: 119/74  Pulse: 76  Temp: 98 F (36.7 C)  Weight:  164 lb 12.8 oz (74.8 kg)      Physical Exam  Pt informed that the ultrasound is considered a limited OB ultrasound and is not intended to be a complete ultrasound exam.  Patient also informed that the ultrasound is not being completed with the intent of assessing for fetal or placental anomalies or any pelvic abnormalities.  Explained that the purpose of today's ultrasound is to assess for  viability.  Patient acknowledges the purpose of the exam and the limitations of the study.    +FHT 126 with [place]   Assessment:   Pregnancy: [id number] Patient Active Problem List   Diagnosis Date Noted  . History of pre-eclampsia in prior pregnancy, currently pregnant 11/18/2018  . Uterine fibroid in pregnancy 10/21/2018  . Supervision of other normal pregnancy, antepartum 10/13/2018  . Fibroids 09/22/2015  . Migraine headache 05/24/2015     Plan:  1. Uterine fibroid in pregnancy  2. Supervision of  other normal pregnancy, antepartum - Obstetric Panel, Including HIV - Culture, OB Urine - Hemoglobin A1c - Cytology - PAP( [place]) - Protein / creatinine ratio, urine - Comp Met (CMET)  3. History of pre-eclampsia in prior pregnancy, currently pregnant - Baby ASA  - Protein / creatinine ratio, urine - Comp Met (CMET)   Initial labs drawn. Continue prenatal vitamins. Genetic Screening discussed, NIPS: declined. Ultrasound discussed; fetal anatomic survey: ordered. Problem list reviewed and updated. The nature of [place] with multiple MDs and other Advanced Practice Providers was explained to patient; also emphasized that residents, students are part of our team. Routine obstetric precautions reviewed. 50% of 45 min visit spent in counseling and coordination of care. Return in about 4 weeks (around 12/16/2018) for virtual visit .  Marcille Buffy DNP, CNM  11/18/18  9:51 AM

## 2018-11-19 LAB — COMPREHENSIVE METABOLIC PANEL
ALT: 13 IU/L (ref 0–32)
AST: 19 IU/L (ref 0–40)
Albumin/Globulin Ratio: 1.7 (ref 1.2–2.2)
Albumin: 4.3 g/dL (ref 3.8–4.8)
Alkaline Phosphatase: 44 IU/L (ref 39–117)
BUN/Creatinine Ratio: 14 (ref 9–23)
BUN: 10 mg/dL (ref 6–20)
Bilirubin Total: 0.2 mg/dL (ref 0.0–1.2)
CO2: 22 mmol/L (ref 20–29)
Calcium: 10 mg/dL (ref 8.7–10.2)
Chloride: 99 mmol/L (ref 96–106)
Creatinine, Ser: 0.7 mg/dL (ref 0.57–1.00)
GFR calc Af Amer: 127 mL/min/{1.73_m2} (ref >59)
GFR calc non Af Amer: 110 mL/min/{1.73_m2} (ref >59)
Globulin, Total: 2.6 g/dL (ref 1.5–4.5)
Glucose: 66 mg/dL (ref 65–99)
Potassium: 4.1 mmol/L (ref 3.5–5.2)
Sodium: 134 mmol/L (ref 134–144)
Total Protein: 6.9 g/dL (ref 6.0–8.5)

## 2018-11-19 LAB — OBSTETRIC PANEL, INCLUDING HIV
Antibody Screen: NEGATIVE
Basophils Absolute: 0 10*3/uL (ref 0.0–0.2)
Basos: 1 %
EOS (ABSOLUTE): 0.1 10*3/uL (ref 0.0–0.4)
Eos: 1 %
HIV Screen 4th Generation wRfx: NONREACTIVE
Hematocrit: 36.4 % (ref 34.0–46.6)
Hemoglobin: 12.1 g/dL (ref 11.1–15.9)
Hepatitis B Surface Ag: NEGATIVE
Immature Grans (Abs): 0 10*3/uL (ref 0.0–0.1)
Immature Granulocytes: 0 %
Lymphocytes Absolute: 1.2 10*3/uL (ref 0.7–3.1)
Lymphs: 20 %
MCH: 30.3 pg (ref 26.6–33.0)
MCHC: 33.2 g/dL (ref 31.5–35.7)
MCV: 91 fL (ref 79–97)
Monocytes Absolute: 0.4 10*3/uL (ref 0.1–0.9)
Monocytes: 6 %
Neutrophils Absolute: 4.3 10*3/uL (ref 1.4–7.0)
Neutrophils: 72 %
Platelets: 299 10*3/uL (ref 150–450)
RBC: 3.99 x10E6/uL (ref 3.77–5.28)
RDW: 12.7 % (ref 11.7–15.4)
RPR Ser Ql: NONREACTIVE
Rh Factor: POSITIVE
Rubella Antibodies, IGG: 4.05 index (ref >0.99)
WBC: 5.9 10*3/uL (ref 3.4–10.8)

## 2018-11-19 LAB — PROTEIN / CREATININE RATIO, URINE
Creatinine, Urine: 147.5 mg/dL
Protein, Ur: 10 mg/dL
Protein/Creat Ratio: 68 mg/g creat (ref 0–200)

## 2018-11-19 LAB — HEMOGLOBIN A1C
Est. average glucose Bld gHb Est-mCnc: 91 mg/dL
Hgb A1c MFr Bld: 4.8 % (ref 4.8–5.6)

## 2018-11-19 LAB — CYTOLOGY - PAP
Diagnosis: NEGATIVE
HPV: NOT DETECTED

## 2018-11-19 LAB — CERVICOVAGINAL ANCILLARY ONLY
Chlamydia: NEGATIVE
Neisseria Gonorrhea: NEGATIVE
Trichomonas: NEGATIVE

## 2018-11-20 LAB — CULTURE, OB URINE

## 2018-11-20 LAB — URINE CULTURE, OB REFLEX: Organism ID, Bacteria: NO GROWTH

## 2018-12-10 ENCOUNTER — Other Ambulatory Visit: Payer: Self-pay

## 2018-12-10 ENCOUNTER — Encounter (HOSPITAL_COMMUNITY): Payer: Self-pay

## 2018-12-10 ENCOUNTER — Inpatient Hospital Stay (HOSPITAL_COMMUNITY)
Admission: AD | Admit: 2018-12-10 | Discharge: 2018-12-10 | Disposition: A | Discharge status: Home / Self Care | Payer: 59 | Attending: Obstetrics and Gynecology | Admitting: Obstetrics and Gynecology

## 2018-12-10 DIAGNOSIS — Z7982 Long term (current) use of aspirin: Secondary | ICD-10-CM | POA: Diagnosis not present

## 2018-12-10 DIAGNOSIS — G43909 Migraine, unspecified, not intractable, without status migrainosus: Secondary | ICD-10-CM | POA: Insufficient documentation

## 2018-12-10 DIAGNOSIS — O99352 Diseases of the nervous system complicating pregnancy, second trimester: Secondary | ICD-10-CM | POA: Insufficient documentation

## 2018-12-10 DIAGNOSIS — O26892 Other specified pregnancy related conditions, second trimester: Secondary | ICD-10-CM | POA: Insufficient documentation

## 2018-12-10 DIAGNOSIS — Z79899 Other long term (current) drug therapy: Secondary | ICD-10-CM | POA: Insufficient documentation

## 2018-12-10 DIAGNOSIS — R51 Headache: Secondary | ICD-10-CM

## 2018-12-10 DIAGNOSIS — O09522 Supervision of elderly multigravida, second trimester: Secondary | ICD-10-CM | POA: Diagnosis not present

## 2018-12-10 DIAGNOSIS — O219 Vomiting of pregnancy, unspecified: Secondary | ICD-10-CM

## 2018-12-10 DIAGNOSIS — Z3A14 14 weeks gestation of pregnancy: Secondary | ICD-10-CM | POA: Diagnosis not present

## 2018-12-10 DIAGNOSIS — O162 Unspecified maternal hypertension, second trimester: Secondary | ICD-10-CM | POA: Insufficient documentation

## 2018-12-10 LAB — URINALYSIS, ROUTINE W REFLEX MICROSCOPIC
Bilirubin Urine: NEGATIVE
Glucose, UA: NEGATIVE mg/dL
Hgb urine dipstick: NEGATIVE
Ketones, ur: 5 mg/dL — AB
Leukocytes,Ua: NEGATIVE
Nitrite: NEGATIVE
Protein, ur: NEGATIVE mg/dL
Specific Gravity, Urine: 1.016 (ref 1.005–1.030)
pH: 7 (ref 5.0–8.0)

## 2018-12-10 MED ORDER — DEXAMETHASONE SODIUM PHOSPHATE 10 MG/ML IJ SOLN
10.0000 mg | Freq: Once | INTRAMUSCULAR | Status: AC
  Administered 2018-12-10: 10 mg via INTRAVENOUS
  Filled 2018-12-10: qty 1

## 2018-12-10 MED ORDER — DIPHENHYDRAMINE HCL 50 MG/ML IJ SOLN
25.0000 mg | Freq: Once | INTRAMUSCULAR | Status: AC
  Administered 2018-12-10: 25 mg via INTRAVENOUS
  Filled 2018-12-10: qty 1

## 2018-12-10 MED ORDER — METOCLOPRAMIDE HCL 5 MG/ML IJ SOLN
10.0000 mg | Freq: Once | INTRAMUSCULAR | Status: AC
  Administered 2018-12-10: 10 mg via INTRAVENOUS
  Filled 2018-12-10: qty 2

## 2018-12-10 MED ORDER — BUTALBITAL-APAP-CAFFEINE 50-325-40 MG PO TABS: 1.0000 | ORAL_TABLET | Freq: Four times a day (QID) | ORAL | 0 refills | Status: DC | PRN

## 2018-12-10 NOTE — MAU Note (Signed)
Pt reports headache for a few days taking tylenol without relief. Pt has history of migraines but has not had one since she got pregnant. Pt also stated this does not feel like one of her migraine.

## 2018-12-10 NOTE — Discharge Instructions (Signed)

## 2018-12-10 NOTE — MAU Provider Note (Addendum)
History     CSN: [id number]  Arrival date and time: 12/10/18 1131   None     Chief Complaint  Patient presents with  . Headache   [name] [name] is a [id number] @ 14wks0d who presents to MAU due to persistent headaches.  Patient states her headache began ~3 days ago. She describes the pain as constant and in a band like distribution around her head. She endorses photophobia and headache lingering overnight. Denies pounding headache, changes in headache location, or phonophobia. She attempted to alleviate the pain with tylenol each of the 3 days to no avail. Patient states that she has a PMHx that includes migraines. Confirms her migraines differ from the headache she is experiencing at the moment. [name] also has a PMHx of pre-eclampsia in a prior pregnancy. She contacted her OB who suggested she come to MAU for monitoring of symptoms and blood pressure. Confirms that she had a protein creatinine ratio wnl earlier this month.  Patient denies lightheadedness, RUQ pain, SOB, chest pain or leg swelling. Notes some nausea and spots in her vision that began ~2 days ago. She is voiding and having bowel movements without changes from her baseline.   Denies leakage of fluid and vaginal bleeding/spotting. She has not felt baby move so far in this pregnancy.   Headache  This is a recurrent problem. The current episode started in the past 7 days. The problem occurs constantly. The problem has been gradually worsening. The pain is located in the frontal region. The pain does not radiate. The pain quality is not similar to prior headaches. The quality of the pain is described as band-like. Associated symptoms include nausea and photophobia. Pertinent negatives include no abdominal pain, back pain, fever, rhinorrhea or vomiting.    OB History    Gravida  5   Para  1   Term  1   Preterm      AB  3   Living  1     SAB  2   TAB      Ectopic      Multiple  0   Live Births  1            Past Medical History:  Diagnosis Date  . Anemia   . Depression   . Headache    Migraines  . Hypertension    with pregnancy  . Kidney infection 2009    Past Surgical History:  Procedure Laterality Date  . DILATION AND EVACUATION N/A 11/27/2017   Procedure: DILATATION AND EVACUATION;  Surgeon: Osborne Oman, MD;  Location: [facility];  Service: Gynecology;  Laterality: N/A;  . FEMUR FRACTURE SURGERY Left    metal rod  . LEG SURGERY Left 1999   steel rod in left femur bone     Family History  Problem Relation Age of Onset  . Hyperlipidemia Mother   . Heart disease Maternal Aunt   . Heart disease Maternal Uncle   . Heart disease Paternal Aunt   . Heart disease Maternal Grandfather   . Heart disease Paternal Grandfather     Social History   Tobacco Use  . Smoking status: Never Smoker  . Smokeless tobacco: Never Used  Substance Use Topics  . Alcohol use: Not Currently    Comment: wine   . Drug use: No    Allergies:  Allergies  Allergen Reactions  . Bactrim [Sulfamethoxazole-Trimethoprim] Hives and Swelling  . Penicillins     Fever-pt was really sick. Has patient had  a PCN reaction causing immediate rash, facial/tongue/throat swelling, SOB or lightheadedness with hypotension: No Has patient had a PCN reaction causing severe rash involving mucus membranes or skin necrosis: No Has patient had a PCN reaction that required hospitalization Yes Has patient had a PCN reaction occurring within the last 10 years: No If all of the above answers are "NO", then may proceed with Cephalosporin use.     Medications Prior to Admission  Medication Sig Dispense Refill Last Dose  . aspirin EC 81 MG tablet Take 1 tablet (81 mg total) by mouth daily. 30 tablet 11   . Prenatal Vit-Fe Fumarate-FA (MULTIVITAMIN-PRENATAL) 27-0.8 MG TABS tablet Take 1 tablet by mouth daily at 12 noon.     . promethazine (PHENERGAN) 25 MG tablet Take 1 tablet (25 mg total) by mouth every 6 (six) hours  as needed for nausea or vomiting. 30 tablet 0     Review of Systems  Constitutional: Positive for fatigue. Negative for chills and fever.  HENT: Negative for congestion and rhinorrhea.   Eyes: Positive for photophobia and visual disturbance.  Respiratory: Negative for chest tightness and shortness of breath.   Cardiovascular: Negative for chest pain and palpitations.  Gastrointestinal: Positive for nausea. Negative for abdominal pain and vomiting.  Genitourinary: Negative for dysuria and vaginal bleeding.  Musculoskeletal: Negative for back pain.  Skin: Negative for rash.  Neurological: Positive for light-headedness and headaches.   Physical Exam   Blood pressure 136/71, pulse 75, temperature 98.2 F (36.8 C), resp. rate 18, height 5\' 1"  (1.549 m), weight 74.4 kg, last menstrual period 09/03/2018.  Vitals:   12/10/18 1232  BP: 136/71  Pulse: 75  Resp: 18  Temp: 98.2 F (36.8 C)   Physical Exam  Constitutional: She is oriented to person, place, and time. She appears well-developed and well-nourished. No distress.  HENT:  Head: Normocephalic and atraumatic.  Eyes: Conjunctivae and EOM are normal. No scleral icterus.  Cardiovascular: Normal rate, regular rhythm and normal heart sounds.  Respiratory: Effort normal. No respiratory distress.  GI: Soft. There is no abdominal tenderness.  Musculoskeletal: Normal range of motion.  Neurological: She is alert and oriented to person, place, and time. She exhibits normal muscle tone. Coordination normal.  Skin: Skin is warm and dry.  Psychiatric: She has a normal mood and affect. Her behavior is normal.     Results for orders placed or performed during the hospital encounter of 12/10/18 (from the past 24 hour(s))  Urinalysis, Routine w reflex microscopic     Status: Abnormal   Collection Time: 12/10/18 12:48 PM  Result Value Ref Range   Color, Urine YELLOW YELLOW   APPearance CLEAR CLEAR   Specific Gravity, Urine 1.016 1.005 -  1.030   pH 7.0 5.0 - 8.0   Glucose, UA NEGATIVE NEGATIVE mg/dL   Hgb urine dipstick NEGATIVE NEGATIVE   Bilirubin Urine NEGATIVE NEGATIVE   Ketones, ur 5 (A) NEGATIVE mg/dL   Protein, ur NEGATIVE NEGATIVE mg/dL   Nitrite NEGATIVE NEGATIVE   Leukocytes,Ua NEGATIVE NEGATIVE    MAU Course  Procedures  MDM -- Patient presented with chief complaint of headache with associated nausea. Her BP was 136/71 on admission, not concerning for pre-eclampsia. Her urinalysis suggests that she is properly hydrated with ketone ~5 and a specific gravity of 1.016. Patient has a PMHx of known migraines and has been treating her symptoms with tylenol. She was given a headache cocktail and noticed improvement of her symptoms following ~1 hour observation.  Assessment  and Plan   1. Pregnancy headache in second trimester   2. Nausea and vomiting in pregnancy   3. [redacted] weeks gestation of pregnancy    -- Migraine cocktail for persistent headache -- DC'd with fioricet prescription  -- Continue to monitor BPs throughout prenatal course.   Floreen Comber A Brinson 12/10/2018, 1:54 PM   OB FELLOW MAU DISCHARGE ATTESTATION  I have seen and examined this patient; I agree with above documentation in the medical student's note. I have edited the note as appropriate.   Phill Myron, D.O. OB Fellow  12/10/2018, 3:29 PM

## 2018-12-17 ENCOUNTER — Encounter: Payer: Self-pay | Admitting: General Practice

## 2018-12-17 ENCOUNTER — Other Ambulatory Visit: Payer: Self-pay

## 2018-12-17 ENCOUNTER — Ambulatory Visit (INDEPENDENT_AMBULATORY_CARE_PROVIDER_SITE_OTHER): Payer: 59 | Admitting: Obstetrics and Gynecology

## 2018-12-17 ENCOUNTER — Encounter: Payer: Self-pay | Admitting: Obstetrics and Gynecology

## 2018-12-17 VITALS — Wt 164.0 lb

## 2018-12-17 DIAGNOSIS — Z3480 Encounter for supervision of other normal pregnancy, unspecified trimester: Secondary | ICD-10-CM

## 2018-12-17 DIAGNOSIS — Z3A15 15 weeks gestation of pregnancy: Secondary | ICD-10-CM

## 2018-12-17 DIAGNOSIS — G43809 Other migraine, not intractable, without status migrainosus: Secondary | ICD-10-CM

## 2018-12-17 DIAGNOSIS — O09299 Supervision of pregnancy with other poor reproductive or obstetric history, unspecified trimester: Secondary | ICD-10-CM

## 2018-12-17 DIAGNOSIS — O09292 Supervision of pregnancy with other poor reproductive or obstetric history, second trimester: Secondary | ICD-10-CM

## 2018-12-17 MED ORDER — BUTALBITAL-APAP-CAFFEINE 50-325-40 MG PO TABS: 1.0000 | ORAL_TABLET | Freq: Four times a day (QID) | ORAL | 0 refills | Status: DC | PRN

## 2018-12-17 MED ORDER — CYCLOBENZAPRINE HCL 10 MG PO TABS: 10.0000 mg | ORAL_TABLET | Freq: Three times a day (TID) | ORAL | 1 refills | Status: DC | PRN

## 2018-12-17 NOTE — Progress Notes (Signed)
MY CHART VIDEO VIRTUAL OBSTETRICS VISIT ENCOUNTER NOTE  I connected with [name] [name] on 12/17/18 at  9:10 AM EDT by My Chart video at home and verified that I am speaking with the correct person using two identifiers.   I discussed the limitations, risks, security and privacy concerns of performing an evaluation and management service by My Chart video and the availability of in person appointments. I also discussed with the patient that there may be a patient responsible charge related to this service. The patient expressed understanding and agreed to proceed.  Subjective:  [name] [name] is a 38 y.o. G5P1031 at [redacted]w[redacted]d being followed for ongoing prenatal care.  She is currently monitored for the following issues for this low-risk pregnancy and has Migraine headache; Fibroids; Supervision of other normal pregnancy, antepartum; Uterine fibroid in pregnancy; and History of pre-eclampsia in prior pregnancy, currently pregnant on their problem list.  Patient reports migraine headache since she was seen in MAU on 12/10/2018. She take the Fioricet that she was prescribed, but "it never really gets rid of the migraine." She has a h/o migraines, but unable to take the medication she used to take during pregnancy. Reports fetal movement. Denies any contractions, bleeding or leaking of fluid.   The following portions of the patient's history were reviewed and updated as appropriate: allergies, current medications, past family history, past medical history, past social history, past surgical history and problem list.   Objective:   General:  Alert, oriented and cooperative.   Mental Status: Normal mood and affect perceived. Normal judgment and thought content.  Rest of physical exam deferred due to type of encounter  Wt 164 lb (74.4 kg)   LMP 09/03/2018 (Exact Date)   BMI 30.99 kg/m  **Done by patient's own at home BP cuff and scale  Assessment and Plan:  Pregnancy: [id number] at [redacted]w[redacted]d  1. Other  migraine without status migrainosus, not intractable - Rx for cyclobenzaprine (FLEXERIL) 10 MG tablet; Take 1 tablet (10 mg total) by mouth every 8 (eight) hours as needed for muscle spasms.  Dispense: 30 tablet; Refill: 1  advised to take 1 wit Fioricet - Rx renewal for butalbital-acetaminophen-caffeine (FIORICET) 50-325-40 MG tablet; Take 1-2 tablets by mouth every 6 (six) hours as needed for headache or migraine.  Dispense: 30 tablet; Refill: 0  2. Supervision of other normal pregnancy, antepartum - Anticipatory guidance for nv on [facility] - Offered comfort measures for RLP  3. History of pre-eclampsia in prior pregnancy, currently pregnant - Notified of normal PEC baseline labs - Taking bASA daily  Preterm labor symptoms and general obstetric precautions including but not limited to vaginal bleeding, contractions, leaking of fluid and fetal movement were reviewed in detail with the patient.  I discussed the assessment and treatment plan with the patient. The patient was provided an opportunity to ask questions and all were answered. The patient agreed with the plan and demonstrated an understanding of the instructions. The patient was advised to call back or seek an in-person office evaluation/go to MAU at [facility] for any urgent or concerning symptoms. Please refer to After Visit Summary for other counseling recommendations.   I provided 10 minutes of non-face-to-face time during this encounter. There was 5 minutes of chart review time spent prior to this encounter. Total time spent = 15 minutes.  Return in about 5 weeks (around 01/21/2019) for Return OB - My Chart video.  Future Appointments  Date Time Provider [place]  01/14/2019 10:15 AM  [place] [facility]-M[facility] M[facility]-US  01/21/2019 10:10 AM Laury Deep, CNM [facility] None    Laury Deep, [place] for [employer], [place]

## 2019-01-14 ENCOUNTER — Other Ambulatory Visit: Payer: Self-pay

## 2019-01-14 ENCOUNTER — Ambulatory Visit (HOSPITAL_COMMUNITY)
Admission: RE | Admit: 2019-01-14 | Discharge: 2019-01-14 | Disposition: A | Discharge status: Home / Self Care | Payer: 59 | Source: Ambulatory Visit | Attending: Advanced Practice Midwife | Admitting: Advanced Practice Midwife

## 2019-01-14 ENCOUNTER — Other Ambulatory Visit: Payer: Self-pay | Admitting: Advanced Practice Midwife

## 2019-01-14 DIAGNOSIS — O09299 Supervision of pregnancy with other poor reproductive or obstetric history, unspecified trimester: Secondary | ICD-10-CM

## 2019-01-14 DIAGNOSIS — Z3480 Encounter for supervision of other normal pregnancy, unspecified trimester: Secondary | ICD-10-CM | POA: Diagnosis present

## 2019-01-14 DIAGNOSIS — O09292 Supervision of pregnancy with other poor reproductive or obstetric history, second trimester: Secondary | ICD-10-CM

## 2019-01-14 DIAGNOSIS — Z3A19 19 weeks gestation of pregnancy: Secondary | ICD-10-CM

## 2019-01-14 DIAGNOSIS — O09522 Supervision of elderly multigravida, second trimester: Secondary | ICD-10-CM

## 2019-01-21 ENCOUNTER — Encounter: Payer: Self-pay | Admitting: Obstetrics and Gynecology

## 2019-01-21 ENCOUNTER — Other Ambulatory Visit: Payer: Self-pay

## 2019-01-21 ENCOUNTER — Ambulatory Visit (INDEPENDENT_AMBULATORY_CARE_PROVIDER_SITE_OTHER): Payer: 59 | Admitting: Obstetrics and Gynecology

## 2019-01-21 VITALS — BP 123/72 | HR 91 | Temp 98.7°F | Wt 168.6 lb

## 2019-01-21 DIAGNOSIS — O09292 Supervision of pregnancy with other poor reproductive or obstetric history, second trimester: Secondary | ICD-10-CM

## 2019-01-21 DIAGNOSIS — O9A212 Injury, poisoning and certain other consequences of external causes complicating pregnancy, second trimester: Secondary | ICD-10-CM

## 2019-01-21 DIAGNOSIS — Z3A20 20 weeks gestation of pregnancy: Secondary | ICD-10-CM

## 2019-01-21 DIAGNOSIS — O09299 Supervision of pregnancy with other poor reproductive or obstetric history, unspecified trimester: Secondary | ICD-10-CM

## 2019-01-21 DIAGNOSIS — Z3480 Encounter for supervision of other normal pregnancy, unspecified trimester: Secondary | ICD-10-CM

## 2019-01-21 DIAGNOSIS — S29012A Strain of muscle and tendon of back wall of thorax, initial encounter: Secondary | ICD-10-CM

## 2019-01-21 NOTE — Progress Notes (Signed)
    LOW-RISK PREGNANCY OFFICE VISIT Patient name: [name] [name] MRN [id number]  Date of birth: 10-19-1980 Chief Complaint:   Routine Prenatal Visit  History of Present Illness:   [name] [name] is a 38 y.o. G82P1031 female at [redacted]w[redacted]d with an Estimated Date of Delivery: 06/10/19 being seen today for ongoing management of a low-risk pregnancy.  Today she reports backache and pelvic pressure. Contractions: Not present. Vag. Bleeding: None.  Movement: Present. denies leaking of fluid. Review of Systems:   Pertinent items are noted in HPI Denies abnormal vaginal discharge w/ itching/odor/irritation, headaches, visual changes, shortness of breath, chest pain, abdominal pain, severe nausea/vomiting, or problems with urination or bowel movements unless otherwise stated above. Pertinent History Reviewed:  Reviewed past medical,surgical, social, obstetrical and family history.  Reviewed problem list, medications and allergies. Physical Assessment:   Vitals:   01/21/19 1020  BP: 123/72  Pulse: 91  Temp: 98.7 F (37.1 C)  Weight: 168 lb 9.6 oz (76.5 kg)  Body mass index is 31.86 kg/m.        Physical Examination:   General appearance: Well appearing, and in no distress  Mental status: Alert, oriented to person, place, and time  Skin: Warm & dry  Cardiovascular: Normal heart rate noted  Respiratory: Normal respiratory effort, no distress  Abdomen: Soft, gravid, nontender  Pelvic: Cervical exam deferred         Extremities: Edema: None  Fetal Status: Fetal Heart Rate (bpm): 149 Fundal Height: 20 cm Movement: Present     Assessment & Plan:  1) Low-risk pregnancy G5P1031 at [redacted]w[redacted]d with an Estimated Date of Delivery: 06/10/19   2) Supervision of other normal pregnancy, antepartum - Anticipatory guidance of optimized OB schedule - Next visit via [place] in 4 wks then 2hr GTT in office in 8 wks  3) History of pre-eclampsia in prior pregnancy, currently pregnant - Continue taking daily bASA  4)  Muscle strain of right upper back, initial encounter - Advised to rest muscle, apply alternating ice and heat - Information provided on musculoskeletal pain - Advised to continue YouTube pregnancy exercises, but to use extreme caution - Referred to use [facility]   Meds: No orders of the defined types were placed in this encounter.  Labs/procedures today: none  Plan:  Continue routine obstetrical care   Reviewed: Preterm labor symptoms and general obstetric precautions including but not limited to vaginal bleeding, contractions, leaking of fluid and fetal movement were reviewed in detail with the patient.  All questions were answered. Has home bp cuff.Check bp weekly, let us know if >140/90.   Follow-up: Return in about 4 weeks (around 02/18/2019) for Return OB - My Chart video then 2hr GTT in 8 wks.   Laury Deep MSN, CNM 01/21/2019

## 2019-01-21 NOTE — Patient Instructions (Addendum)
You may take Tylenol 1000 mg every 6 hours as you need it for pain. You may use Tiger Balm muscle cream/ointment.  Musculoskeletal Pain Musculoskeletal pain refers to aches and pains in your bones, joints, muscles, and the tissues that surround them. This pain can occur in any part of the body. It can last for a short time (acute) or a long time (chronic). A physical exam, lab tests, and imaging studies may be done to find the cause of your musculoskeletal pain. Follow these instructions at home:  Lifestyle  Try to control or lower your stress levels. Stress increases muscle tension and can worsen musculoskeletal pain. It is important to recognize when you are anxious or stressed and learn ways to manage it. This may include: ? Meditation or yoga. ? Cognitive or behavioral therapy. ? Acupuncture or massage therapy.  You may continue all activities unless the activities cause more pain. When the pain gets better, slowly resume your normal activities. Gradually increase the intensity and duration of your activities or exercise. Managing pain, stiffness, and swelling  Take over-the-counter and prescription medicines only as told by your health care provider.  When your pain is severe, bed rest may be helpful. Lie or sit in any position that is comfortable, but get out of bed and walk around at least every couple of hours.  If directed, apply heat to the affected area as often as told by your health care provider. Use the heat source that your health care provider recommends, such as a moist heat pack or a heating pad. ? Place a towel between your skin and the heat source. ? Leave the heat on for 20-30 minutes. ? Remove the heat if your skin turns bright red. This is especially important if you are unable to feel pain, heat, or cold. You may have a greater risk of getting burned.  If directed, put ice on the painful area. ? Put ice in a plastic bag. ? Place a towel between your skin and the  bag. ? Leave the ice on for 20 minutes, 2-3 times a day. General instructions  Your health care provider may recommend that you see a physical therapist. This person can help you come up with a safe exercise program. Do any exercises as told by your physical therapist.  Keep all follow-up visits, including any physical therapy visits, as told by your health care providers. This is important. Contact a health care provider if:  Your pain gets worse.  Medicines do not help ease your pain.  You cannot use the part of your body that hurts, such as your arm, leg, or neck.  You have trouble sleeping.  You have trouble doing your normal activities. Get help right away if:  You have a new injury and your pain is worse or different.  You feel numb or you have tingling in the painful area. Summary  Musculoskeletal pain refers to aches and pains in your bones, joints, muscles, and the tissues that surround them.  This pain can occur in any part of the body.  Your health care provider may recommend that you see a physical therapist. This person can help you come up with a safe exercise program. Do any exercises as told by your physical therapist.  Lower your stress level. Stress can worsen musculoskeletal pain. Ways to lower stress may include meditation, yoga, cognitive or behavioral therapy, acupuncture, and massage therapy. This information is not intended to replace advice given to you by your health  care provider. Make sure you discuss any questions you have with your health care provider. Document Released: 03/04/2005 Document Revised: 02/14/2017 Document Reviewed: 04/03/2016 Elsevier Patient Education  2020 [employer].

## 2019-02-18 ENCOUNTER — Telehealth (INDEPENDENT_AMBULATORY_CARE_PROVIDER_SITE_OTHER): Payer: 59 | Admitting: Obstetrics and Gynecology

## 2019-02-18 ENCOUNTER — Other Ambulatory Visit: Payer: Self-pay

## 2019-02-18 ENCOUNTER — Encounter: Payer: Self-pay | Admitting: Obstetrics and Gynecology

## 2019-02-18 VITALS — BP 109/67 | HR 88

## 2019-02-18 DIAGNOSIS — O09292 Supervision of pregnancy with other poor reproductive or obstetric history, second trimester: Secondary | ICD-10-CM

## 2019-02-18 DIAGNOSIS — G43809 Other migraine, not intractable, without status migrainosus: Secondary | ICD-10-CM

## 2019-02-18 DIAGNOSIS — Z3A24 24 weeks gestation of pregnancy: Secondary | ICD-10-CM

## 2019-02-18 DIAGNOSIS — O09299 Supervision of pregnancy with other poor reproductive or obstetric history, unspecified trimester: Secondary | ICD-10-CM

## 2019-02-18 DIAGNOSIS — Z3480 Encounter for supervision of other normal pregnancy, unspecified trimester: Secondary | ICD-10-CM

## 2019-02-18 DIAGNOSIS — D259 Leiomyoma of uterus, unspecified: Secondary | ICD-10-CM

## 2019-02-18 DIAGNOSIS — O3412 Maternal care for benign tumor of corpus uteri, second trimester: Secondary | ICD-10-CM

## 2019-02-18 MED ORDER — CYCLOBENZAPRINE HCL 10 MG PO TABS: 10.0000 mg | ORAL_TABLET | Freq: Three times a day (TID) | ORAL | 1 refills | Status: DC | PRN

## 2019-02-18 MED ORDER — BUTALBITAL-APAP-CAFFEINE 50-325-40 MG PO TABS: 1.0000 | ORAL_TABLET | Freq: Four times a day (QID) | ORAL | 0 refills | Status: DC | PRN

## 2019-02-18 NOTE — Progress Notes (Signed)
MY CHART VIDEO VIRTUAL OBSTETRICS VISIT ENCOUNTER NOTE  I connected with [name] [name] on 02/18/19 at 10:50 AM EST by My Chart video at home and verified that I am speaking with the correct person using two identifiers.   I discussed the limitations, risks, security and privacy concerns of performing an evaluation and management service by My Chart video and the availability of in person appointments. I also discussed with the patient that there may be a patient responsible charge related to this service. The patient expressed understanding and agreed to proceed.  Subjective:  [name] [name] is a 38 y.o. G5P1031 at [redacted]w[redacted]d being followed for ongoing prenatal care.  She is currently monitored for the following issues for this low-risk pregnancy and has Migraine headache; Fibroids; Supervision of other normal pregnancy, antepartum; Uterine fibroid in pregnancy; and History of pre-eclampsia in prior pregnancy, currently pregnant on their problem list.  Patient reports headache. She reports that her migraines were gone for about 3 weeks, but "they are now coming back and she is having to take Fioricet and Flexeril together and running out of medication. Reports fetal movement. Denies any contractions, bleeding or leaking of fluid.   The following portions of the patient's history were reviewed and updated as appropriate: allergies, current medications, past family history, past medical history, past social history, past surgical history and problem list.   Objective:   General:  Alert, oriented and cooperative.   Mental Status: Normal mood and affect perceived. Normal judgment and thought content.  Rest of physical exam deferred due to type of encounter  BP 109/67   Pulse 88   LMP 09/03/2018 (Exact Date)  **Done by patient's own at home BP cuff   Assessment and Plan:  Pregnancy: G5P1031 at [redacted]w[redacted]d  1. History of pre-eclampsia in prior pregnancy, currently pregnant - Continue taking bASA daily  2. Uterine fibroid in pregnancy  3. Supervision of other normal pregnancy, antepartum - Anticipatory guidance for 2 hr GTT, 3rd trimester labs, TdaP, and flu vaccine at next visit. Advised to be fasting at midnight th night before the appt  4. Other migraine without status migrainosus, not intractable - Ambulatory referral to Neurology - butalbital-acetaminophen-caffeine (FIORICET) 50-325-40 MG tablet; Take 1-2 tablets by mouth every 6 (six) hours as needed for headache or migraine.  Dispense: 30 tablet; Refill: 0 - cyclobenzaprine (FLEXERIL) 10 MG tablet; Take 1 tablet (10 mg total) by mouth every 8 (eight) hours as needed for muscle spasms.  Dispense: 30 tablet; Refill: 1  Preterm labor symptoms and general obstetric precautions including but not limited to vaginal bleeding, contractions, leaking of fluid and fetal movement were reviewed in detail with the patient.  I discussed the assessment and treatment plan with the patient. The patient was provided an opportunity to ask questions and all were answered. The patient agreed with the plan and demonstrated an understanding of the instructions. The patient was advised to call back or seek an in-person office evaluation/go to MAU at [facility] for any urgent or concerning symptoms. Please refer to After Visit Summary for other counseling recommendations.   I provided 10 minutes of non-face-to-face time during this encounter. There was 5 minutes of chart review time spent prior to this encounter. Total time spent = 15 minutes.  Return in about 4 weeks (around 03/18/2019) for Return OB 2hr GTT.  Future Appointments  Date Time Provider [place]  03/18/2019  8:10 AM Jorje Guild, NP [facility] None  04/15/2019 10:50 AM Laury Deep,  CNM [place], [place] for [employer], [place]

## 2019-03-18 ENCOUNTER — Ambulatory Visit (INDEPENDENT_AMBULATORY_CARE_PROVIDER_SITE_OTHER): Payer: 59 | Admitting: Student

## 2019-03-18 ENCOUNTER — Encounter: Payer: Self-pay | Admitting: Student

## 2019-03-18 ENCOUNTER — Other Ambulatory Visit: Payer: Self-pay

## 2019-03-18 VITALS — BP 115/74 | HR 76 | Temp 97.8°F | Wt 168.0 lb

## 2019-03-18 DIAGNOSIS — Z880 Allergy status to penicillin: Secondary | ICD-10-CM | POA: Insufficient documentation

## 2019-03-18 DIAGNOSIS — Z23 Encounter for immunization: Secondary | ICD-10-CM | POA: Diagnosis not present

## 2019-03-18 DIAGNOSIS — G43809 Other migraine, not intractable, without status migrainosus: Secondary | ICD-10-CM

## 2019-03-18 DIAGNOSIS — Z3480 Encounter for supervision of other normal pregnancy, unspecified trimester: Secondary | ICD-10-CM

## 2019-03-18 DIAGNOSIS — O2613 Low weight gain in pregnancy, third trimester: Secondary | ICD-10-CM

## 2019-03-18 DIAGNOSIS — Z3A28 28 weeks gestation of pregnancy: Secondary | ICD-10-CM

## 2019-03-18 NOTE — Patient Instructions (Signed)
CIRCUMCISION  Circumcision is considered an elective/non-medically necessary procedure. There are many reasons parents decide to have their sons circumsized. During the first year of life circumcised males have a reduced risk of urinary tract infections but after this year the rates between circumcised males and uncircumcised males are the same.  It is safe to have your son circumcised outside of the hospital and the places above perform them regularly.    Places to have your son circumcised:    [facility] [phone] $480 by 4 wks  [facility] [phone] $244 by 4 wks  [facility] [phone] $175 by 2 wks  [facility] [phone] $250 by 7 days [facility] [phone] $150 by 4 wks  These prices sometimes change but are roughly what you can expect to pay. Please call and confirm pricing.     Heartburn During Pregnancy  Heartburn is pain or discomfort in the throat or chest. It may cause a burning feeling. It happens when stomach acid moves up into the tube that carries food from your mouth to your stomach (esophagus). Heartburn is common during pregnancy. It usually goes away or gets better after giving birth. Follow these instructions at home: Eating and drinking  Do not drink alcohol while you are pregnant.  Figure out which foods and beverages make you feel worse, and avoid them.  Beverages that you may want to avoid include: ? Coffee and tea (with or without caffeine). ? Energy drinks and sports drinks. ? Bubbly (carbonated) drinks or sodas. ? Citrus fruit juices.  Foods that you may want to avoid include: ? Chocolate and cocoa. ? Peppermint and mint flavorings. ? Garlic, onions, and horseradish. ? Spicy and acidic foods. These include peppers, chili powder, curry powder, vinegar, hot sauces, and barbecue  sauce. ? Citrus fruits, such as oranges, lemons, and limes. ? Tomato-based foods, such as red sauce, chili, and salsa. ? Fried and fatty foods, such as donuts, french fries, potato chips, and high-fat dressings. ? High-fat meats, such as hot dogs, cold cuts, sausage, ham, and bacon. ? High-fat dairy items, such as whole milk, butter, and cheese.  Eat small meals often, instead of large meals.  Avoid drinking a lot of liquid with your meals.  Avoid eating meals during the 2-3 hours before you go to bed.  Avoid lying down right after you eat.  Do not exercise right after you eat. Medicines  Take over-the-counter and prescription medicines only as told by your doctor.  Do not take aspirin, ibuprofen, or other NSAIDs unless your doctor tells you to do that.  Your doctor may tell you to avoid medicines that have sodium bicarbonate in them. General instructions   If told, raise the head of your bed about 6 inches (15 cm). You can do this by putting blocks under the legs. Sleeping with more pillows does not help with heartburn.  Do not use any products that contain nicotine or tobacco, such as cigarettes and e-cigarettes. If you need help quitting, ask your doctor.  Wear loose-fitting clothing.  Try to lower your stress, such as with yoga or meditation. If you need help, ask your doctor.  Stay at a healthy weight. If you are overweight, work with your doctor to safely lose weight.  Keep all follow-up visits as told by your doctor. This is important. Contact a doctor if:  You get new symptoms.  Your symptoms do not get better with treatment.  You have weight loss and you do not know why.  You have trouble  swallowing.  You make loud sounds when you breathe (wheeze).  You have a cough that does not go away.  You have heartburn often for more than 2 weeks.  You feel sick to your stomach (nauseous), and this does not get better with treatment.  You are throwing up  (vomiting), and this does not get better with treatment.  You have pain in your belly (abdomen). Get help right away if:  You have very bad chest pain that spreads to your arm, neck, or jaw.  You feel sweaty, dizzy, or light-headed.  You have trouble breathing.  You have pain when swallowing.  You throw up and your throw-up looks like blood or coffee grounds.  Your poop (stool) is bloody or black. This information is not intended to replace advice given to you by your health care provider. Make sure you discuss any questions you have with your health care provider. Document Revised: 06/25/2018 Document Reviewed: 11/20/2015 Elsevier Patient Education  [place] of Pregnancy  The third trimester is from week 28 through week 40 (months 7 through 9). This trimester is when your unborn baby (fetus) is growing very fast. At the end of the ninth month, the unborn baby is about 20 inches in length. It weighs about 6-10 pounds. Follow these instructions at home: Medicines  Take over-the-counter and prescription medicines only as told by your doctor. Some medicines are safe and some medicines are not safe during pregnancy.  Take a prenatal vitamin that contains at least 600 micrograms (mcg) of folic acid.  If you have trouble pooping (constipation), take medicine that will make your stool soft (stool softener) if your doctor approves. Eating and drinking   Eat regular, healthy meals.  Avoid raw meat and uncooked cheese.  If you get low calcium from the food you eat, talk to your doctor about taking a daily calcium supplement.  Eat four or five small meals rather than three large meals a day.  Avoid foods that are high in fat and sugars, such as fried and sweet foods.  To prevent constipation: ? Eat foods that are high in fiber, like fresh fruits and vegetables, whole grains, and beans. ? Drink enough fluids to keep your pee (urine) clear or pale  yellow. Activity  Exercise only as told by your doctor. Stop exercising if you start to have cramps.  Avoid heavy lifting, wear low heels, and sit up straight.  Do not exercise if it is too hot, too humid, or if you are in a place of great height (high altitude).  You may continue to have sex unless your doctor tells you not to. Relieving pain and discomfort  Wear a good support bra if your breasts are tender.  Take frequent breaks and rest with your legs raised if you have leg cramps or low back pain.  Take warm water baths (sitz baths) to soothe pain or discomfort caused by hemorrhoids. Use hemorrhoid cream if your doctor approves.  If you develop puffy, bulging veins (varicose veins) in your legs: ? Wear support hose or compression stockings as told by your doctor. ? Raise (elevate) your feet for 15 minutes, 3-4 times a day. ? Limit salt in your food. Safety  Wear your seat belt when driving.  Make a list of emergency phone numbers, including numbers for family, friends, the hospital, and police and fire departments. Preparing for your baby's arrival To prepare for the arrival of your baby:  Take prenatal  classes.  Practice driving to the hospital.  Visit the hospital and tour the maternity area.  Talk to your work about taking leave once the baby comes.  Pack your hospital bag.  Prepare the baby's room.  Go to your doctor visits.  Buy a rear-facing car seat. Learn how to install it in your car. General instructions  Do not use hot tubs, steam rooms, or saunas.  Do not use any products that contain nicotine or tobacco, such as cigarettes and e-cigarettes. If you need help quitting, ask your doctor.  Do not drink alcohol.  Do not douche or use tampons or scented sanitary pads.  Do not cross your legs for long periods of time.  Do not travel for long distances unless you must. Only do so if your doctor says it is okay.  Visit your dentist if you have not  gone during your pregnancy. Use a soft toothbrush to brush your teeth. Be gentle when you floss.  Avoid cat litter boxes and soil used by cats. These carry germs that can cause birth defects in the baby and can cause a loss of your baby (miscarriage) or stillbirth.  Keep all your prenatal visits as told by your doctor. This is important. Contact a doctor if:  You are not sure if you are in labor or if your water has broken.  You are dizzy.  You have mild cramps or pressure in your lower belly.  You have a nagging pain in your belly area.  You continue to feel sick to your stomach, you throw up, or you have watery poop.  You have bad smelling fluid coming from your vagina.  You have pain when you pee. Get help right away if:  You have a fever.  You are leaking fluid from your vagina.  You are spotting or bleeding from your vagina.  You have severe belly cramps or pain.  You lose or gain weight quickly.  You have trouble catching your breath and have chest pain.  You notice sudden or extreme puffiness (swelling) of your face, hands, ankles, feet, or legs.  You have not felt the baby move in over an hour.  You have severe headaches that do not go away with medicine.  You have trouble seeing.  You are leaking, or you are having a gush of fluid, from your vagina before you are 37 weeks.  You have regular belly spasms (contractions) before you are 37 weeks. Summary  The third trimester is from week 28 through week 40 (months 7 through 9). This time is when your unborn baby is growing very fast.  Follow your doctor's advice about medicine, food, and activity.  Get ready for the arrival of your baby by taking prenatal classes, getting all the baby items ready, preparing the baby's room, and visiting your doctor to be checked.  Get help right away if you are bleeding from your vagina, or you have chest pain and trouble catching your breath, or if you have not felt your  baby move in over an hour. This information is not intended to replace advice given to you by your health care provider. Make sure you discuss any questions you have with your health care provider. Document Revised: 06/25/2018 Document Reviewed: 04/09/2016 Elsevier Patient Education  [place].

## 2019-03-18 NOTE — Progress Notes (Signed)
PRENATAL VISIT NOTE  Subjective:  [name] [name] is a 38 y.o. G5P1031 at [redacted]w[redacted]d being seen today for ongoing prenatal care.  She is currently monitored for the following issues for this low-risk pregnancy and has Migraine headache; Fibroids; Supervision of other normal pregnancy, antepartum; Uterine fibroid in pregnancy; History of pre-eclampsia in prior pregnancy, currently pregnant; and Penicillin allergy on their problem list.  Patient reports no complaints.  Contractions: Not present. Vag. Bleeding: None.  Movement: Present. Denies leaking of fluid.   Reports headache frequency has improved since earlier in the pregnancy. Was taking fioricet every other day but has decreased. The only thing that helps her symptoms is taking fioricet & flexeril together. Has an appointment with neurology in 2 weeks.   Has daily heartburn. Improved with dietary changes. Occasionally has to take tums. Eats her last meal no later than 7 pm. Has been having smoothies for dinner since her symptoms are worse at night.   The following portions of the patient's history were reviewed and updated as appropriate: allergies, current medications, past family history, past medical history, past social history, past surgical history and problem list.   Objective:   Vitals:   03/18/19 0831  BP: 115/74  Pulse: 76  Temp: 97.8 F (36.6 C)  Weight: 168 lb (76.2 kg)    Fetal Status: Fetal Heart Rate (bpm): 130 Fundal Height: 28 cm Movement: Present     General:  Alert, oriented and cooperative. Patient is in no acute distress.  Skin: Skin is warm and dry. No rash noted.   Cardiovascular: Normal heart rate noted  Respiratory: Normal respiratory effort, no problems with respiration noted  Abdomen: Soft, gravid, appropriate for gestational age.  Pain/Pressure: Absent     Pelvic: Cervical exam deferred        Extremities: Normal range of motion.  Edema: None  Mental Status: Normal mood and affect. Normal behavior.  Normal judgment and thought content.   Assessment and Plan:  Pregnancy: [id number] at [redacted]w[redacted]d 1. Supervision of other normal pregnancy, antepartum  - Glucose Tolerance, 2 Hours w/1 Hour - HIV antibody (with reflex) - RPR - CBC - Tdap vaccine greater than or equal to 7yo IM  2. Need for tetanus, diphtheria, and acellular pertussis (Tdap) vaccine in patient of adolescent age or older  - Tdap vaccine greater than or equal to 7yo IM  3. Low weight gain during pregnancy in third trimester -no weight gain since beginning of November & has only gained 8 lbs so far this pregnancy. Fundal height = 28 cm & appropriate for her gestational age. Attributes lack of weight gain to dietary changes due to her heartburn. Recommend increasing calories 200-300 per day. Consider meal replacement shakes if food makes heartburn worse. Will bring back in person for next visit so we can check on weight and fundal height.   4. Other migraine without status migrainosus, not intractable -symptoms have improved. Not taking fioricet as frequently anymore. Discussed rebound headaches with fioricet usage. Has neuro appointment in a few weeks. Can contact us if having to take fioricet more than 1-2x per week. May consider headache clinic with [facility] depending on neuro's comfort with ob patients.    Preterm labor symptoms and general obstetric precautions including but not limited to vaginal bleeding, contractions, leaking of fluid and fetal movement were reviewed in detail with the patient. Please refer to After Visit Summary for other counseling recommendations.   Return in about 4 weeks (around 04/15/2019) for Routine OB, in person  for weight check and fundal height check.  Future Appointments  Date Time Provider [place]  03/26/2019  8:30 AM Marcial Pacas, MD [facility] None  04/15/2019 10:50 AM Laury Deep, CNM [facility] None    Jorje Guild, NP

## 2019-03-19 LAB — CBC
Hematocrit: 32.6 % — ABNORMAL LOW (ref 34.0–46.6)
Hemoglobin: 11.2 g/dL (ref 11.1–15.9)
MCH: 30 pg (ref 26.6–33.0)
MCHC: 34.4 g/dL (ref 31.5–35.7)
MCV: 87 fL (ref 79–97)
Platelets: 224 10*3/uL (ref 150–450)
RBC: 3.73 x10E6/uL — ABNORMAL LOW (ref 3.77–5.28)
RDW: 12.4 % (ref 11.7–15.4)
WBC: 6.3 10*3/uL (ref 3.4–10.8)

## 2019-03-19 LAB — HIV ANTIBODY (ROUTINE TESTING W REFLEX): HIV Screen 4th Generation wRfx: NONREACTIVE

## 2019-03-19 LAB — GLUCOSE TOLERANCE, 2 HOURS W/ 1HR
Glucose, 1 hour: 130 mg/dL (ref 65–179)
Glucose, 2 hour: 110 mg/dL (ref 65–152)
Glucose, Fasting: 72 mg/dL (ref 65–91)

## 2019-03-19 LAB — RPR: RPR Ser Ql: NONREACTIVE

## 2019-03-19 NOTE — L&D Delivery Note (Signed)
Delivery Note Pt began involuntarily pushing at approx 0431. She then had SROM of mod MSF at 0441 while in hands and knees position, and then at 0448 a viable female was delivered via Vaginal, Spontaneous (Presentation: LOA).  APGAR: 9,9 ; weight 2846gm (6lb 4.4oz).   Placenta status: spont ,intact.  Cord: 3 vessel    Anesthesia: 1% lidocaine Episiotomy:  none Lacerations:  1st degree perineal Suture Repair: 3.0 monocryl Est. Blood Loss (mL): 104  Mom to [facility] for 24h mag sulfate therapy.  Baby to Couplet care / Skin to Skin.  Myrtis Ser CNM 05/20/2019, 5:12 AM  Please schedule this patient for Postpartum visit in: 4 weeks with the following provider: Any provider Virtual For C/S patients schedule nurse incision check in weeks 2 weeks: no High risk pregnancy complicated by: pre-e Delivery mode:  SVD Anticipated Birth Control:  none PP Procedures needed: BP check- 1wk Schedule Integrated [place] visit: no

## 2019-03-26 ENCOUNTER — Other Ambulatory Visit: Payer: Self-pay

## 2019-03-26 ENCOUNTER — Encounter: Payer: Self-pay | Admitting: Neurology

## 2019-03-26 ENCOUNTER — Ambulatory Visit (INDEPENDENT_AMBULATORY_CARE_PROVIDER_SITE_OTHER): Payer: 59 | Admitting: Neurology

## 2019-03-26 VITALS — BP 116/63 | HR 69 | Temp 97.0°F | Ht 60.5 in | Wt 171.0 lb

## 2019-03-26 DIAGNOSIS — G43709 Chronic migraine without aura, not intractable, without status migrainosus: Secondary | ICD-10-CM | POA: Diagnosis not present

## 2019-03-26 DIAGNOSIS — Z3480 Encounter for supervision of other normal pregnancy, unspecified trimester: Secondary | ICD-10-CM

## 2019-03-26 DIAGNOSIS — IMO0002 Reserved for concepts with insufficient information to code with codable children: Secondary | ICD-10-CM

## 2019-03-26 MED ORDER — PROMETHAZINE HCL 25 MG PO TABS: 25.0000 mg | ORAL_TABLET | Freq: Four times a day (QID) | ORAL | 6 refills | Status: DC | PRN

## 2019-03-26 NOTE — Patient Instructions (Signed)
Magnesium oxide 400mg  Riboflavin=B2 100mg  twice a day
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
## 2019-04-15 ENCOUNTER — Telehealth: Payer: 59 | Admitting: Obstetrics and Gynecology

## 2019-04-29 ENCOUNTER — Other Ambulatory Visit: Payer: Self-pay

## 2019-04-29 ENCOUNTER — Encounter: Payer: Self-pay | Admitting: General Practice

## 2019-04-29 ENCOUNTER — Ambulatory Visit (INDEPENDENT_AMBULATORY_CARE_PROVIDER_SITE_OTHER): Payer: 59 | Admitting: Obstetrics and Gynecology

## 2019-04-29 VITALS — BP 126/72 | HR 79 | Temp 97.9°F | Wt 173.0 lb

## 2019-04-29 DIAGNOSIS — F418 Other specified anxiety disorders: Secondary | ICD-10-CM | POA: Insufficient documentation

## 2019-04-29 DIAGNOSIS — O26899 Other specified pregnancy related conditions, unspecified trimester: Secondary | ICD-10-CM

## 2019-04-29 DIAGNOSIS — Z3A34 34 weeks gestation of pregnancy: Secondary | ICD-10-CM

## 2019-04-29 DIAGNOSIS — R42 Dizziness and giddiness: Secondary | ICD-10-CM

## 2019-04-29 DIAGNOSIS — F329 Major depressive disorder, single episode, unspecified: Secondary | ICD-10-CM

## 2019-04-29 DIAGNOSIS — O99343 Other mental disorders complicating pregnancy, third trimester: Secondary | ICD-10-CM

## 2019-04-29 DIAGNOSIS — Z566 Other physical and mental strain related to work: Secondary | ICD-10-CM

## 2019-04-29 DIAGNOSIS — R102 Pelvic and perineal pain: Secondary | ICD-10-CM

## 2019-04-29 DIAGNOSIS — F515 Nightmare disorder: Secondary | ICD-10-CM

## 2019-04-29 DIAGNOSIS — G4700 Insomnia, unspecified: Secondary | ICD-10-CM | POA: Insufficient documentation

## 2019-04-29 DIAGNOSIS — O99345 Other mental disorders complicating the puerperium: Secondary | ICD-10-CM | POA: Insufficient documentation

## 2019-04-29 DIAGNOSIS — H539 Unspecified visual disturbance: Secondary | ICD-10-CM

## 2019-04-29 DIAGNOSIS — Z8659 Personal history of other mental and behavioral disorders: Secondary | ICD-10-CM | POA: Insufficient documentation

## 2019-04-29 DIAGNOSIS — Z3480 Encounter for supervision of other normal pregnancy, unspecified trimester: Secondary | ICD-10-CM

## 2019-04-29 DIAGNOSIS — O26893 Other specified pregnancy related conditions, third trimester: Secondary | ICD-10-CM

## 2019-04-29 DIAGNOSIS — R12 Heartburn: Secondary | ICD-10-CM

## 2019-04-29 DIAGNOSIS — F32A Depression, unspecified: Secondary | ICD-10-CM

## 2019-04-29 MED ORDER — OMEPRAZOLE 20 MG PO CPDR: 20.0000 mg | DELAYED_RELEASE_CAPSULE | Freq: Two times a day (BID) | ORAL | 6 refills | Status: DC

## 2019-04-29 NOTE — BH Specialist Note (Signed)
Integrated Behavioral Health via Telemedicine Video Visit  04/29/2019 [name] [name] [id number]  Number of [place] visits: 1 Session Start time: 9:38  Session End time: 10:23 Total time: 45   Referring Provider: Laury Deep, CNM Type of Visit: Video Patient/Family location: Home [facility] Provider location: [facility] All persons participating in visit: Patient [name] [name] and [place]    Confirmed patient's address: Yes  Confirmed patient's phone number: Yes  Any changes to demographics: No   Confirmed patient's insurance: Yes  Any changes to patient's insurance: No   Discussed confidentiality: Yes   I connected with [name] [name] by a video enabled telemedicine application and verified that I am speaking with the correct person using two identifiers.     I discussed the limitations of evaluation and management by telemedicine and the availability of in person appointments.  I discussed that the purpose of this visit is to provide behavioral health care while limiting exposure to the novel coronavirus.   Discussed there is a possibility of technology failure and discussed alternative modes of communication if that failure occurs.  I discussed that engaging in this video visit, they consent to the provision of behavioral healthcare and the services will be billed under their insurance.  Patient and/or legal guardian expressed understanding and consented to video visit: Yes   PRESENTING CONCERNS: Patient and/or family reports the following symptoms/concerns: Pt states her primary concerns today are job stress, nightmares, insomnia, crying spells, restlessness, heartburn, headaches, lack of interest, fatigue, poor appetite, lack of concentration, anxiety, difficulty relaxing, irritability. Pt is open to both medication and self-coping strategies, as she would like to prevent worsening symptoms, as she experienced with previous pregnancy. Duration of  problem: Current pregnancy; Severity of problem: moderate  STRENGTHS (Protective Factors/Coping Skills): Self-awareness, open to treatment  GOALS ADDRESSED: Patient will: 1.  Reduce symptoms of: anxiety and depression  2.  Increase knowledge and/or ability of: healthy habits and self-management skills  3.  Demonstrate ability to: Increase healthy adjustment to current life circumstances  INTERVENTIONS: Interventions utilized:  Mindfulness or Psychologist, educational, Sleep Hygiene and Psychoeducation and/or Health Education Standardized Assessments completed: GAD-7 and PHQ 9  ASSESSMENT: Patient currently experiencing Major depressive disorder, recurrent, moderate.   Patient may benefit from psychoeducation and brief therapeutic interventions regarding coping with symptoms of depression and anxiety .  PLAN: 1. Follow up with behavioral health clinician on : One week 2. Behavioral recommendations:  -Begin taking BH medication as prescribed  -Begin taking Magnesium as prescribed (to help with sleep) -Begin taking warm bath nightly -CALM relaxation breathing exercise twice daily(morning; at bedtime with sleep sounds) -Consider obtaining chair massage within next month Oncologist [facility] at [email or web address]  3. Referral(s): [place] (In Clinic)  I discussed the assessment and treatment plan with the patient and/or parent/guardian. They were provided an opportunity to ask questions and all were answered. They agreed with the plan and demonstrated an understanding of the instructions.   They were advised to call back or seek an in-person evaluation if the symptoms worsen or if the condition fails to improve as anticipated.  Caroleen Hamman [facility]  Depression screen [facility] 2/9 04/30/2019 11/18/2018 06/19/2016 05/22/2016 05/22/2016  Decreased Interest 3 0 0 3 0  Down, Depressed, Hopeless 0 0 0 2 2  PHQ - 2 Score 3 0 0 5 2  Altered sleeping 3 0 0 3 -   Tired, decreased energy 3 3 0 3 -  Change in appetite  3 0 0 3 -  Feeling bad or failure about yourself  0 0 0 2 -  Trouble concentrating 3 0 0 1 -  Moving slowly or fidgety/restless 0 0 0 1 -  Suicidal thoughts 0 0 0 0 -  PHQ-9 Score 15 3 0 18 -  Difficult doing work/chores - Not difficult at all - Somewhat difficult -   GAD 7 : Generalized Anxiety Score 04/30/2019 11/18/2018 05/22/2016  Nervous, Anxious, on Edge 3 0 1  Control/stop worrying 0 0 2  Worry too much - different things 1 0 2  Trouble relaxing 3 0 3  Restless 1 0 1  Easily annoyed or irritable 3 3 3   Afraid - awful might happen 0 0 3  Total GAD 7 Score 11 3 15   Anxiety Difficulty - Not difficult at all Somewhat difficult

## 2019-04-30 ENCOUNTER — Ambulatory Visit (INDEPENDENT_AMBULATORY_CARE_PROVIDER_SITE_OTHER): Payer: Medicaid Other | Admitting: Clinical

## 2019-04-30 DIAGNOSIS — F331 Major depressive disorder, recurrent, moderate: Secondary | ICD-10-CM | POA: Diagnosis not present

## 2019-04-30 LAB — COMPREHENSIVE METABOLIC PANEL
ALT: 10 IU/L (ref 0–32)
AST: 16 IU/L (ref 0–40)
Albumin/Globulin Ratio: 1.4 (ref 1.2–2.2)
Albumin: 3.8 g/dL (ref 3.8–4.8)
Alkaline Phosphatase: 98 IU/L (ref 39–117)
BUN/Creatinine Ratio: 6 — ABNORMAL LOW (ref 9–23)
BUN: 5 mg/dL — ABNORMAL LOW (ref 6–20)
Bilirubin Total: 0.2 mg/dL (ref 0.0–1.2)
CO2: 21 mmol/L (ref 20–29)
Calcium: 9.5 mg/dL (ref 8.7–10.2)
Chloride: 102 mmol/L (ref 96–106)
Creatinine, Ser: 0.82 mg/dL (ref 0.57–1.00)
GFR calc Af Amer: 105 mL/min/{1.73_m2} (ref >59)
GFR calc non Af Amer: 91 mL/min/{1.73_m2} (ref >59)
Globulin, Total: 2.7 g/dL (ref 1.5–4.5)
Glucose: 68 mg/dL (ref 65–99)
Potassium: 4.1 mmol/L (ref 3.5–5.2)
Sodium: 137 mmol/L (ref 134–144)
Total Protein: 6.5 g/dL (ref 6.0–8.5)

## 2019-04-30 LAB — CBC
Hematocrit: 36.3 % (ref 34.0–46.6)
Hemoglobin: 12.7 g/dL (ref 11.1–15.9)
MCH: 30.5 pg (ref 26.6–33.0)
MCHC: 35 g/dL (ref 31.5–35.7)
MCV: 87 fL (ref 79–97)
Platelets: 225 10*3/uL (ref 150–450)
RBC: 4.17 x10E6/uL (ref 3.77–5.28)
RDW: 12.8 % (ref 11.7–15.4)
WBC: 8.2 10*3/uL (ref 3.4–10.8)

## 2019-04-30 LAB — PROTEIN / CREATININE RATIO, URINE
Creatinine, Urine: 18.5 mg/dL
Protein, Ur: <4 mg/dL

## 2019-04-30 MED ORDER — SERTRALINE HCL 25 MG PO TABS: 25.0000 mg | ORAL_TABLET | Freq: Every day | ORAL | 3 refills | Status: DC

## 2019-04-30 NOTE — Patient Instructions (Signed)

## 2019-04-30 NOTE — Patient Instructions (Signed)
Perinatal Depression When a woman feels excessive sadness, anger, or anxiety during pregnancy or during the first 12 months after she gives birth, she has a condition called perinatal depression. Depression can interfere with work, school, relationships, and other everyday activities. If it is not managed properly, it can also cause problems in the mother and her baby. Sometimes, perinatal depression is left untreated because symptoms are thought to be normal mood swings during and right after pregnancy. If you have symptoms of depression, it is important to talk with your health care provider. What are the causes? The exact cause of this condition is not known. Hormonal changes during and after pregnancy may play a role in causing perinatal depression. What increases the risk? You are more likely to develop this condition if:  You have a personal or family history of depression, anxiety, or mood disorders.  You experience a stressful life event during pregnancy, such as the death of a loved one.  You have a lot of regular life stress.  You do not have support from family members or loved ones, or you are in an abusive relationship. What are the signs or symptoms? Symptoms of this condition include:  Feeling sad or hopeless.  Feelings of guilt.  Feeling irritable or overwhelmed.  Changes in your appetite.  Lack of energy or motivation.  Sleep problems.  Difficulty concentrating or completing tasks.  Loss of interest in hobbies or relationships.  Headaches or stomach problems that do not go away. How is this diagnosed? This condition is diagnosed based on a physical exam and mental evaluation. In some cases, your health care provider may use a depression screening tool. These tools include a list of questions that can help a health care provider diagnose depression. Your health care provider may refer you to a mental health expert who specializes in depression. How is this  treated? This condition may be treated with:  Medicines. Your health care provider will only give you medicines that have been proven safe for pregnancy and breastfeeding.  Talk therapy with a mental health professional to help change your patterns of thinking (cognitive behavioral therapy).  Support groups.  Brain stimulation or light therapies.  Stress reduction therapies, such as mindfulness. Follow these instructions at home: Lifestyle  Do not use any products that contain nicotine or tobacco, such as cigarettes and e-cigarettes. If you need help quitting, ask your health care provider.  Do not use alcohol when you are pregnant. After your baby is born, limit alcohol intake to no more than 1 drink a day. One drink equals 12 oz of beer, 5 oz of wine, or 1 oz of hard liquor.  Consider joining a support group for new mothers. Ask your health care provider for recommendations.  Take good care of yourself. Make sure you: ? Get plenty of sleep. If you are having trouble sleeping, talk with your health care provider. ? Eat a healthy diet. This includes plenty of fruits and vegetables, whole grains, and lean proteins. ? Exercise regularly, as told by your health care provider. Ask your health care provider what exercises are safe for you. General instructions  Take over-the-counter and prescription medicines only as told by your health care provider.  Talk with your partner or family members about your feelings during pregnancy. Share any concerns or anxieties that you may have.  Ask for help with tasks or chores when you need it. Ask friends and family members to provide meals, watch your children, or help with   cleaning.  Keep all follow-up visits as told by your health care provider. This is important. Contact a health care provider if:  You (or people close to you) notice that you have any symptoms of depression.  You have depression and your symptoms get worse.  You  experience side effects from medicines, such as nausea or sleep problems. Get help right away if:  You feel like hurting yourself, your baby, or someone else. If you ever feel like you may hurt yourself or others, or have thoughts about taking your own life, get help right away. You can go to your nearest emergency department or call:  Your local emergency services (911 in the [place]  A suicide crisis helpline, such as the National Suicide Prevention Lifeline at 1-800-273-8255. This is open 24 hours a day. Summary  Perinatal depression is when a woman feels excessive sadness, anger, or anxiety during pregnancy or during the first 12 months after she gives birth.  If perinatal depression is not treated, it can lead to health problems for the mother and her baby.  This condition is treated with medicines, talk therapy, stress reduction therapies, or a combination of two or more treatments.  Talk with your partner or family members about your feelings. Do not be afraid to ask for help. This information is not intended to replace advice given to you by your health care provider. Make sure you discuss any questions you have with your health care provider. Document Revised: 08/19/2018 Document Reviewed: 05/01/2016 Elsevier Patient Education  2020 Elsevier Inc. Perinatal Anxiety When a woman feels excessive tension or worry (anxiety) during pregnancy or during the first 12 months after she gives birth, she has a condition called perinatal anxiety. Anxiety can interfere with work, school, relationships, and other everyday activities. If it is not managed properly, it can also cause problems in the mother and her baby.  If you are pregnant and you have symptoms of an anxiety disorder, it is important to talk with your health care provider. What are the causes? The exact cause of this condition is not known. Hormonal changes during and after pregnancy may play a role in causing perinatal  anxiety. What increases the risk? You are more likely to develop this condition if:  You have a personal or family history of depression, anxiety, or mood disorders.  You experience a stressful life event during pregnancy, such as the death of a loved one.  You have a lot of regular life stress, such as being a single parent.  You have thyroid problems. What are the signs or symptoms? Perinatal anxiety can be different for everyone. It may include:  Panic attacks (panic disorder). These are intense episodes of fear or discomfort that may also cause sweating, nausea, shortness of breath, or fear of dying. They usually last 5-15 minutes.  Reliving an upsetting (traumatic) event through distressing thoughts, dreams, or flashbacks (post-traumatic stress disorder, or PTSD).  Excessive worry about multiple problems (generalized anxiety disorder).  Fear and stress about leaving certain people or loved ones (separation anxiety).  Performing repetitive tasks (compulsions) to relieve stress or worry (obsessive compulsive disorder, or OCD).  Fear of certain objects or situations (phobias).  Excessive worrying, such as a constant feeling that something bad is going to happen.  Inability to relax.  Difficulty concentrating.  Sleep problems.  Frequent nightmares or disturbing thoughts. How is this diagnosed? This condition is diagnosed based on a physical exam and mental evaluation. In some cases, your health care   provider may use an anxiety screening tool. These tools include a list of questions that can help a health care provider diagnose anxiety. Your health care provider may refer you to a mental health expert who specializes in anxiety. How is this treated? This condition may be treated with:  Medicines. Your health care provider will only give you medicines that have been proven safe for pregnancy and breastfeeding.  Talk therapy with a mental health professional to help change  your patterns of thinking (cognitive behavioral therapy).  Mindfulness-based stress reduction.  Other relaxation therapies, such as deep breathing or guided muscle relaxation.  Support groups. Follow these instructions at home: Lifestyle  Do not use any products that contain nicotine or tobacco, such as cigarettes and e-cigarettes. If you need help quitting, ask your health care provider.  Do not use alcohol when you are pregnant. After your baby is born, limit alcohol intake to no more than 1 drink a day. One drink equals 12 oz of beer, 5 oz of wine, or 1 oz of hard liquor.  Consider joining a support group for new mothers. Ask your health care provider for recommendations.  Take good care of yourself. Make sure you: ? Get plenty of sleep. If you are having trouble sleeping, talk with your health care provider. ? Eat a healthy diet. This includes plenty of fruits and vegetables, whole grains, and lean proteins. ? Exercise regularly, as told by your health care provider. Ask your health care provider what exercises are safe for you. General instructions  Take over-the-counter and prescription medicines only as told by your health care provider.  Talk with your partner or family members about your feelings during pregnancy. Share any concerns or fears that you may have.  Ask for help with tasks or chores when you need it. Ask friends and family members to provide meals, watch your children, or help with cleaning.  Keep all follow-up visits as told by your health care provider. This is important. Contact a health care provider if:  You (or people close to you) notice that you have any symptoms of anxiety or depression.  You have anxiety and your symptoms get worse.  You experience side effects from medicines, such as nausea or sleep problems. Get help right away if:  You feel like hurting yourself, your baby, or someone else. If you ever feel like you may hurt yourself or  others, or have thoughts about taking your own life, get help right away. You can go to your nearest emergency department or call:  Your local emergency services (911 in the [place]  A suicide crisis helpline, such as the National Suicide Prevention Lifeline at 1-800-273-8255. This is open 24 hours a day. Summary  Perinatal anxiety is when a woman feels excessive tension or worry during pregnancy or during the first 12 months after she gives birth.  Perinatal anxiety may include panic attacks, post-traumatic stress disorder, separation anxiety, phobias, or generalized anxiety.  Perinatal anxiety can cause physical health problems in the mother and baby if not properly managed.  This condition is treated with medicines, talk therapy, stress reduction therapies, or a combination of two or more treatments.  Talk with your partner or family members about your concerns or fears. Do not be afraid to ask for help. This information is not intended to replace advice given to you by your health care provider. Make sure you discuss any questions you have with your health care provider. Document Revised: 03/07/2017 Document Reviewed: 05/01/2016   Elsevier Patient Education  2020 Elsevier Inc.  

## 2019-05-02 ENCOUNTER — Encounter: Payer: Self-pay | Admitting: Obstetrics and Gynecology

## 2019-05-02 NOTE — Progress Notes (Signed)
LOW-RISK PREGNANCY OFFICE VISIT Patient name: [name] [name] MRN [id number]  Date of birth: [birth year] Chief Complaint:   Routine Prenatal Visit  History of Present Illness:   [name] [name] is a 39 y.o. G57P1031 female at 98w3dwith an Estimated Date of Delivery: 06/10/19 being seen today for ongoing management of a low-risk pregnancy.  Today she reports she is depressed and feeling very anxious due to increased stress at work. She feels dizzy, has blurry vision, not able to sleep, pain in hips and lower abdomen - wearing the maternity belt does not help. She is very tearful. She reports a h/o depression and anxiety after having her last baby. Contractions: Not present. Vag. Bleeding: None.  Movement: Present. denies leaking of fluid. Review of Systems:   Pertinent items are noted in HPI Denies abnormal vaginal discharge w/ itching/odor/irritation, headaches, visual changes, shortness of breath, chest pain, abdominal pain, severe nausea/vomiting, or problems with urination or bowel movements unless otherwise stated above. Pertinent History Reviewed:  Reviewed past medical,surgical, social, obstetrical and family history.  Reviewed problem list, medications and allergies. Physical Assessment:   Vitals:   04/29/19 1642  BP: 126/72  Pulse: 79  Temp: 97.9 F (36.6 C)  Weight: 173 lb (78.5 kg)  Body mass index is 33.23 kg/m.        Physical Examination:   General appearance: Well appearing, and in no distress  Mental status: Alert, oriented to person, place, and time  Skin: Warm & dry  Cardiovascular: Normal heart rate noted  Respiratory: Normal respiratory effort, no distress  Abdomen: Soft, gravid, nontender  Pelvic: Cervical exam deferred         Extremities: Edema: None  Fetal Status: Fetal Heart Rate (bpm): 156 Fundal Height: 34 cm Movement: Present    No results found for this or any previous visit (from the past 24 hour(s)).  Assessment & Plan:  1) Low-risk pregnancy G5P1031  at 340w3dith an Estimated Date of Delivery: 06/10/19   2) Supervision of other normal pregnancy, antepartum - Return to office in 2 wks for GBS, cervical check and mood check  3) Stress at work  - Ambulatory referral to [facility] complete FMLA papers to be OOW now until after postpartum visit -- declined pt request a note to be OOW today and tomorrow, unable to go out of work now d/t incomplete work  4) Insomnia, unspecified type  - Ambulatory referral to [facility]) Nightmares  - [facility]ulatory [place] [facility]) Heartburn during pregnancy in third trimester  - Rx for omeprazole (PRILOSEC) 20 MG capsule  7) Dizziness  - CBC,  - Comp Met (CMET),  - Protein / creatinine ratio, urine  8) 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 omeprazole (PRILOSEC) 20 MG capsule    Sig: Take 1 capsule (20 mg total) by mouth 2 (two) times daily before a meal.    Dispense:  60 capsule    Refill:  6    Order Specific Question:   Supervising Provider    Answer:   PRDonnamae Jude2[[id number]]. sertraline (ZOLOFT) 25 MG tablet    Sig: Take 1 tablet (25 mg total) by mouth daily.  Dispense:  30 tablet    Refill:  3    Order Specific Question:   Supervising Provider    Answer:   Donnamae Jude [[id number]]   Labs/procedures today: PEC  Plan:  Continue routine obstetrical care   Reviewed: Preterm labor symptoms and general obstetric precautions including but not limited to vaginal bleeding, contractions, leaking of fluid and fetal movement were reviewed in detail with the patient.  All questions were answered. Has home bp cuff. Check bp weekly, let us know if >140/90.   Follow-up: Return in about 2 weeks  (around 05/13/2019) for Return OB w/GBS.  Orders Placed This Encounter  Procedures  . CBC  . Comp Met (CMET)  . Protein / creatinine ratio, urine  . Ambulatory referral to [place]  . Ambulatory referral to Physical Therapy   Laury Deep MSN, CNM 04/29/2019

## 2019-05-11 ENCOUNTER — Encounter: Payer: Self-pay | Admitting: Certified Nurse Midwife

## 2019-05-12 ENCOUNTER — Ambulatory Visit (INDEPENDENT_AMBULATORY_CARE_PROVIDER_SITE_OTHER): Payer: 59 | Admitting: Certified Nurse Midwife

## 2019-05-12 ENCOUNTER — Other Ambulatory Visit: Payer: Self-pay

## 2019-05-12 ENCOUNTER — Encounter: Payer: Self-pay | Admitting: Certified Nurse Midwife

## 2019-05-12 ENCOUNTER — Other Ambulatory Visit (HOSPITAL_COMMUNITY)
Admission: RE | Admit: 2019-05-12 | Discharge: 2019-05-12 | Disposition: A | Discharge status: Home / Self Care | Payer: 59 | Source: Ambulatory Visit | Attending: Certified Nurse Midwife | Admitting: Certified Nurse Midwife

## 2019-05-12 ENCOUNTER — Institutional Professional Consult (permissible substitution): Payer: 59 | Admitting: Licensed Clinical Social Worker

## 2019-05-12 VITALS — BP 146/88 | HR 64 | Temp 98.3°F | Wt 175.6 lb

## 2019-05-12 DIAGNOSIS — O09299 Supervision of pregnancy with other poor reproductive or obstetric history, unspecified trimester: Secondary | ICD-10-CM

## 2019-05-12 DIAGNOSIS — Z3480 Encounter for supervision of other normal pregnancy, unspecified trimester: Secondary | ICD-10-CM | POA: Insufficient documentation

## 2019-05-12 DIAGNOSIS — Z8659 Personal history of other mental and behavioral disorders: Secondary | ICD-10-CM

## 2019-05-12 DIAGNOSIS — Z3A35 35 weeks gestation of pregnancy: Secondary | ICD-10-CM

## 2019-05-12 DIAGNOSIS — O09293 Supervision of pregnancy with other poor reproductive or obstetric history, third trimester: Secondary | ICD-10-CM

## 2019-05-12 DIAGNOSIS — Z880 Allergy status to penicillin: Secondary | ICD-10-CM

## 2019-05-12 NOTE — Patient Instructions (Signed)

## 2019-05-12 NOTE — Progress Notes (Signed)
   PRENATAL VISIT NOTE  Subjective:  [name] [name] is a 39 y.o. [id number] at [redacted]w[redacted]d being seen today for ongoing prenatal care.  She is currently monitored for the following issues for this low-risk pregnancy and has Migraine headache; Fibroids; Supervision of other normal pregnancy, antepartum; Uterine fibroid in pregnancy; History of pre-eclampsia in prior pregnancy, currently pregnant; Penicillin allergy; Chronic migraine; Stress at work; Insomnia; Nightmares; Heartburn during pregnancy in third trimester; Dizziness; History of depression; Postpartum anxiety; and Visual disturbances on their problem list.  Patient reports continued anxiety and stress d/t work.  Contractions: Irregular. Vag. Bleeding: None.  Movement: Present. Denies leaking of fluid.   The following portions of the patient's history were reviewed and updated as appropriate: allergies, current medications, past family history, past medical history, past social history, past surgical history and problem list.   Objective:   Vitals:   05/12/19 1410 05/12/19 1454  BP: (!) 142/82 (!) 146/88  Pulse: 66 64  Temp: 98.3 F (36.8 C)   Weight: 175 lb 9.6 oz (79.7 kg)     Fetal Status: Fetal Heart Rate (bpm): 155 Fundal Height: 36 cm Movement: Present  Presentation: Vertex  General:  Alert, oriented and cooperative. Patient is in no acute distress.  Skin: Skin is warm and dry. No rash noted.   Cardiovascular: Normal heart rate noted  Respiratory: Normal respiratory effort, no problems with respiration noted  Abdomen: Soft, gravid, appropriate for gestational age.  Pain/Pressure: Present     Pelvic: Cervical exam performed Dilation: 1 Effacement (%): 50 Station: -3  Extremities: Normal range of motion.  Edema: None  Mental Status: Normal mood and affect. Normal behavior. Normal judgment and thought content.   Assessment and Plan:  Pregnancy: [id number] at [redacted]w[redacted]d 1. Supervision of other normal pregnancy, antepartum - Patient doing  okay, reports continued stress and anxiety d/t work - Patient started on Zoloft 1 week ago and reports that it is helping but does not believe it is in full effect, discussed with patient that it takes up to 3 weeks to be effective.  - Anticipatory guidance on upcoming appointments including in person visit next week - Discussed with patient stress relief techniques, patient reports she cut down hours at work  - Cervicovaginal ancillary only( [place]) - Strep Gp B Culture+Rflx  2. History of pre-eclampsia in prior pregnancy, currently pregnant - BP elevated in office today  - Patient denies PEC symptoms, recent PEC workup completed  - In person appointment next week to evaluate BP, discussed with patient that BP needs to be closely monitored and to go to [facility] with PEC symptoms   3. Penicillin allergy - GBS culture and sensitivities ordered  4. History of depression - currently on 25mg  of Zoloft   Preterm labor symptoms and general obstetric precautions including but not limited to vaginal bleeding, contractions, leaking of fluid and fetal movement were reviewed in detail with the patient. Please refer to After Visit Summary for other counseling recommendations.   Return in about 1 week (around 05/19/2019) for ROB-in person/BP check .  Future Appointments  Date Time Provider [place]  05/14/2019 10:45 AM [place]  05/19/2019  2:50 PM Tresea Mall, CNM [facility] None  05/21/2019  9:30 AM Monico Hoar, PT [facility]    Lajean Manes, CNM

## 2019-05-13 LAB — CERVICOVAGINAL ANCILLARY ONLY
Bacterial Vaginitis (gardnerella): NEGATIVE
Candida Glabrata: NEGATIVE
Candida Vaginitis: NEGATIVE
Chlamydia: NEGATIVE
Comment: NEGATIVE
Comment: NEGATIVE
Comment: NEGATIVE
Comment: NEGATIVE
Comment: NEGATIVE
Comment: NORMAL
Neisseria Gonorrhea: NEGATIVE
Trichomonas: NEGATIVE

## 2019-05-14 ENCOUNTER — Other Ambulatory Visit: Payer: Self-pay

## 2019-05-14 ENCOUNTER — Telehealth: Payer: Self-pay | Admitting: General Practice

## 2019-05-14 ENCOUNTER — Ambulatory Visit: Payer: Medicaid Other | Admitting: Clinical

## 2019-05-14 DIAGNOSIS — Z5329 Procedure and treatment not carried out because of patient's decision for other reasons: Secondary | ICD-10-CM

## 2019-05-14 DIAGNOSIS — Z91199 Patient's noncompliance with other medical treatment and regimen due to unspecified reason: Secondary | ICD-10-CM

## 2019-05-14 NOTE — Telephone Encounter (Signed)
Patient called stating that she is having contractions 5-6 minutes apart.  Pt is [redacted]w[redacted]d.  Consulted with Heather Hogan,CNM and was told to inform patient to go to [facility]U for further evalulation.  Pt verbalized understanding.

## 2019-05-14 NOTE — BH Specialist Note (Signed)
Pt did not arrive to video visit and did not answer the phone ;Left HIPPA-compliant message to call back [name] [name] from [facility] at [phone].  ; left MyChart message for patient.    [place] via Telemedicine Video Visit  05/14/2019 [place] [id number]  Garlan Fair

## 2019-05-15 ENCOUNTER — Inpatient Hospital Stay (HOSPITAL_COMMUNITY)
Admission: AD | Admit: 2019-05-15 | Discharge: 2019-05-15 | Disposition: A | Discharge status: Home / Self Care | Payer: 59 | Attending: Obstetrics & Gynecology | Admitting: Obstetrics & Gynecology

## 2019-05-15 ENCOUNTER — Other Ambulatory Visit: Payer: Self-pay

## 2019-05-15 ENCOUNTER — Encounter (HOSPITAL_COMMUNITY): Payer: Self-pay | Admitting: Obstetrics & Gynecology

## 2019-05-15 DIAGNOSIS — R42 Dizziness and giddiness: Secondary | ICD-10-CM | POA: Diagnosis not present

## 2019-05-15 DIAGNOSIS — R109 Unspecified abdominal pain: Secondary | ICD-10-CM | POA: Insufficient documentation

## 2019-05-15 DIAGNOSIS — O26893 Other specified pregnancy related conditions, third trimester: Secondary | ICD-10-CM

## 2019-05-15 DIAGNOSIS — Z3A36 36 weeks gestation of pregnancy: Secondary | ICD-10-CM

## 2019-05-15 DIAGNOSIS — Z8759 Personal history of other complications of pregnancy, childbirth and the puerperium: Secondary | ICD-10-CM

## 2019-05-15 DIAGNOSIS — Z880 Allergy status to penicillin: Secondary | ICD-10-CM | POA: Insufficient documentation

## 2019-05-15 LAB — COMPREHENSIVE METABOLIC PANEL
ALT: 14 U/L (ref 0–44)
AST: 20 U/L (ref 15–41)
Albumin: 2.9 g/dL — ABNORMAL LOW (ref 3.5–5.0)
Alkaline Phosphatase: 89 U/L (ref 38–126)
Anion gap: 10 (ref 5–15)
BUN: 9 mg/dL (ref 6–20)
CO2: 20 mmol/L — ABNORMAL LOW (ref 22–32)
Calcium: 8.9 mg/dL (ref 8.9–10.3)
Chloride: 104 mmol/L (ref 98–111)
Creatinine, Ser: 0.98 mg/dL (ref 0.44–1.00)
GFR calc Af Amer: >60 mL/min (ref >60)
GFR calc non Af Amer: >60 mL/min (ref >60)
Glucose, Bld: 115 mg/dL — ABNORMAL HIGH (ref 70–99)
Potassium: 3.6 mmol/L (ref 3.5–5.1)
Sodium: 134 mmol/L — ABNORMAL LOW (ref 135–145)
Total Bilirubin: 0.7 mg/dL (ref 0.3–1.2)
Total Protein: 6.1 g/dL — ABNORMAL LOW (ref 6.5–8.1)

## 2019-05-15 LAB — URINALYSIS, ROUTINE W REFLEX MICROSCOPIC
Bilirubin Urine: NEGATIVE
Glucose, UA: NEGATIVE mg/dL
Hgb urine dipstick: NEGATIVE
Ketones, ur: 5 mg/dL — AB
Leukocytes,Ua: NEGATIVE
Nitrite: NEGATIVE
Protein, ur: 30 mg/dL — AB
Specific Gravity, Urine: 1.021 (ref 1.005–1.030)
pH: 6 (ref 5.0–8.0)

## 2019-05-15 LAB — CBC
HCT: 35.6 % — ABNORMAL LOW (ref 36.0–46.0)
Hemoglobin: 12.1 g/dL (ref 12.0–15.0)
MCH: 30.8 pg (ref 26.0–34.0)
MCHC: 34 g/dL (ref 30.0–36.0)
MCV: 90.6 fL (ref 80.0–100.0)
Platelets: 190 10*3/uL (ref 150–400)
RBC: 3.93 MIL/uL (ref 3.87–5.11)
RDW: 13.8 % (ref 11.5–15.5)
WBC: 6.8 10*3/uL (ref 4.0–10.5)
nRBC: 0 % (ref 0.0–0.2)

## 2019-05-15 LAB — PROTEIN / CREATININE RATIO, URINE
Creatinine, Urine: 285.79 mg/dL
Protein Creatinine Ratio: 0.1 mg/mg{Cre} (ref 0.00–0.15)
Total Protein, Urine: 28 mg/dL

## 2019-05-15 NOTE — MAU Provider Note (Signed)
History     CSN: [id number]  Arrival date and time: 05/15/19 1254   First Provider Initiated Contact with Patient 05/15/19 1347      Chief Complaint  Patient presents with  . Abdominal Pain  . Dizziness   HPI [name] [name] is a 39 y.o. [id number] at [redacted]w[redacted]d who presents stating she is feeling queasy and dizzy. She reports having increased contractions for the last 24 hours which is when the symptoms started. She has a history of gestational hypertension and was concerned that this might be related to her blood pressure. She denies HA, visual changes or epigastric pain. She reports normal fetal movement and denies any leaking or bleeding. She denies any pain at this time.   OB History    Gravida  5   Para  1   Term  1   Preterm      AB  3   Living  1     SAB  2   TAB      Ectopic      Multiple  0   Live Births  1           Past Medical History:  Diagnosis Date  . Anemia   . Depression   . Gestational hypertension 2017  . Headache    Migraines  . Kidney infection 2009    Past Surgical History:  Procedure Laterality Date  . DILATION AND EVACUATION N/A 11/27/2017   Procedure: DILATATION AND EVACUATION;  Surgeon: Osborne Oman, MD;  Location: [facility];  Service: Gynecology;  Laterality: N/A;  . FEMUR FRACTURE SURGERY Left    metal rod  . LEG SURGERY Left 1999   steel rod in left femur bone     Family History  Problem Relation Age of Onset  . Hyperlipidemia Mother   . Heart disease Maternal Aunt   . Heart disease Maternal Uncle   . Heart disease Paternal Aunt   . Heart disease Maternal Grandfather   . Heart disease Paternal Grandfather     Social History   Tobacco Use  . Smoking status: Never Smoker  . Smokeless tobacco: Never Used  Substance Use Topics  . Alcohol use: Not Currently    Comment: wine   . Drug use: No    Allergies:  Allergies  Allergen Reactions  . Bactrim [Sulfamethoxazole-Trimethoprim] Hives and Swelling  . Penicillins      Fever-pt was really sick. Has patient had a PCN reaction causing immediate rash, facial/tongue/throat swelling, SOB or lightheadedness with hypotension: No Has patient had a PCN reaction causing severe rash involving mucus membranes or skin necrosis: No Has patient had a PCN reaction that required hospitalization Yes Has patient had a PCN reaction occurring within the last 10 years: No If all of the above answers are "NO", then may proceed with Cephalosporin use.     Medications Prior to Admission  Medication Sig Dispense Refill Last Dose  . aspirin EC 81 MG tablet Take 1 tablet (81 mg total) by mouth daily. 30 tablet 11 05/15/2019 at Unknown time  . butalbital-acetaminophen-caffeine (FIORICET) 50-325-40 MG tablet Take 1-2 tablets by mouth every 6 (six) hours as needed for headache or migraine. 30 tablet 0   . cyclobenzaprine (FLEXERIL) 10 MG tablet Take 1 tablet (10 mg total) by mouth every 8 (eight) hours as needed for muscle spasms. 30 tablet 1   . omeprazole (PRILOSEC) 20 MG capsule Take 1 capsule (20 mg total) by mouth 2 (two) times daily before a  meal. 60 capsule 6   . Prenatal Vit-Fe Fumarate-FA (MULTIVITAMIN-PRENATAL) 27-0.8 MG TABS tablet Take 1 tablet by mouth daily at 12 noon.     . promethazine (PHENERGAN) 25 MG tablet Take 1 tablet (25 mg total) by mouth every 6 (six) hours as needed for nausea or vomiting. 30 tablet 6   . sertraline (ZOLOFT) 25 MG tablet Take 1 tablet (25 mg total) by mouth daily. 30 tablet 3     Review of Systems  Constitutional: Negative.  Negative for fatigue and fever.  HENT: Negative.   Respiratory: Negative.  Negative for shortness of breath.   Cardiovascular: Negative.  Negative for chest pain.  Gastrointestinal: Positive for nausea. Negative for abdominal pain, constipation, diarrhea and vomiting.  Genitourinary: Negative.  Negative for dysuria, vaginal bleeding and vaginal discharge.  Neurological: Positive for dizziness. Negative for headaches.    Physical Exam   Blood pressure 129/72, pulse 63, temperature 99.2 F (37.3 C), temperature source Oral, resp. rate 18, height 5\' 1"  (1.549 m), weight 80.2 kg, last menstrual period 09/03/2018, SpO2 97 %.  Patient Vitals for the past 24 hrs:  BP Temp Temp src Pulse Resp SpO2 Height Weight  05/15/19 1449 - - - - 18 - - -  05/15/19 1430 131/77 - - 68 - 97 % - -  05/15/19 1415 125/83 - - 63 - 98 % - -  05/15/19 1400 128/78 - - 62 - 98 % - -  05/15/19 1344 129/72 - - 63 - 97 % - -  05/15/19 1340 130/79 - - 70 - 97 % - -  05/15/19 1316 126/78 99.2 F (37.3 C) Oral 69 18 100 % - -  05/15/19 1311 - - - - - - 5\' 1"  (1.549 m) 80.2 kg   Physical Exam  Nursing note and vitals reviewed. Constitutional: She is oriented to person, place, and time. She appears well-developed and well-nourished. No distress.  HENT:  Head: Normocephalic.  Eyes: Pupils are equal, round, and reactive to light.  Cardiovascular: Normal rate, regular rhythm and normal heart sounds.  Respiratory: Effort normal and breath sounds normal. No respiratory distress.  GI: Soft. Bowel sounds are normal. She exhibits no distension. There is no abdominal tenderness.  Neurological: She is alert and oriented to person, place, and time.  Skin: Skin is warm and dry.  Psychiatric: She has a normal mood and affect. Her behavior is normal. Judgment and thought content normal.     Fetal Tracing:  Baseline: 130 Variability: moderate Accels: 15x15 Decels: none  Toco: occasional uc's   MAU Course  Procedures Results for orders placed or performed during the hospital encounter of 05/15/19 (from the past 24 hour(s))  Urinalysis, Routine w reflex microscopic     Status: Abnormal   Collection Time: 05/15/19  1:27 PM  Result Value Ref Range   Color, Urine YELLOW YELLOW   APPearance HAZY (A) CLEAR   Specific Gravity, Urine 1.021 1.005 - 1.030   pH 6.0 5.0 - 8.0   Glucose, UA NEGATIVE NEGATIVE mg/dL   Hgb urine dipstick  NEGATIVE NEGATIVE   Bilirubin Urine NEGATIVE NEGATIVE   Ketones, ur 5 (A) NEGATIVE mg/dL   Protein, ur 30 (A) NEGATIVE mg/dL   Nitrite NEGATIVE NEGATIVE   Leukocytes,Ua NEGATIVE NEGATIVE   RBC / HPF 21-50 0 - 5 RBC/hpf   WBC, UA 0-5 0 - 5 WBC/hpf   Bacteria, UA RARE (A) NONE SEEN   Squamous Epithelial / LPF 0-5 0 - 5   Mucus  PRESENT   Protein / creatinine ratio, urine     Status: None   Collection Time: 05/15/19  1:27 PM  Result Value Ref Range   Creatinine, Urine 285.79 mg/dL   Total Protein, Urine 28 mg/dL   Protein Creatinine Ratio 0.10 0.00 - 0.15 mg/mg[Cre]  CBC     Status: Abnormal   Collection Time: 05/15/19  1:50 PM  Result Value Ref Range   WBC 6.8 4.0 - 10.5 K/uL   RBC 3.93 3.87 - 5.11 MIL/uL   Hemoglobin 12.1 12.0 - 15.0 g/dL   HCT 35.6 (L) 36.0 - 46.0 %   MCV 90.6 80.0 - 100.0 fL   MCH 30.8 26.0 - 34.0 pg   MCHC 34.0 30.0 - 36.0 g/dL   RDW 13.8 11.5 - 15.5 %   Platelets 190 150 - 400 K/uL   nRBC 0.0 0.0 - 0.2 %  Comprehensive metabolic panel     Status: Abnormal   Collection Time: 05/15/19  1:50 PM  Result Value Ref Range   Sodium 134 (L) 135 - 145 mmol/L   Potassium 3.6 3.5 - 5.1 mmol/L   Chloride 104 98 - 111 mmol/L   CO2 20 (L) 22 - 32 mmol/L   Glucose, Bld 115 (H) 70 - 99 mg/dL   BUN 9 6 - 20 mg/dL   Creatinine, Ser 0.98 0.44 - 1.00 mg/dL   Calcium 8.9 8.9 - 10.3 mg/dL   Total Protein 6.1 (L) 6.5 - 8.1 g/dL   Albumin 2.9 (L) 3.5 - 5.0 g/dL   AST 20 15 - 41 U/L   ALT 14 0 - 44 U/L   Alkaline Phosphatase 89 38 - 126 U/L   Total Bilirubin 0.7 0.3 - 1.2 mg/dL   GFR calc non Af Amer >60 >60 mL/min   GFR calc Af Amer >60 >60 mL/min   Anion gap 10 5 - 15   MDM UA CBC, CMP, PCR NST  Normotensive, labs consistent with baseline. Patient denies any pain at this time. Reassurance provided.   Assessment and Plan   1. History of pregnancy induced hypertension   2. [redacted] weeks gestation of pregnancy    -Discharge home in stable condition -Preeclampsia  precautions discussed -Patient advised to follow-up with [facility] on Wednesday as scheduled -Patient may return to MAU as needed or if her condition were to change or worsen   [place] 05/15/2019, 1:47 PM

## 2019-05-15 NOTE — Discharge Instructions (Signed)

## 2019-05-15 NOTE — MAU Note (Signed)
[name] [name] is a 39 y.o. at [redacted]w[redacted]d here in MAU reporting: states she is concerned about her BP, is feeling woozy. Checked it at home yesterday and it was 135/84. Been having blurry vision for the past several weeks, but no RUQ pain or headache. Was having contractions yesterday but they have gone away. Having lower abdominal pain.  Onset of complaint: today  Pain score: 4/10  Vitals:   05/15/19 1316  BP: 126/78  Pulse: 69  Resp: 18  Temp: 99.2 F (37.3 C)  SpO2: 100%     FHT: +FM  Lab orders placed from triage: UA

## 2019-05-16 LAB — STREP GP B CULTURE+RFLX: Strep Gp B Culture+Rflx: NEGATIVE

## 2019-05-19 ENCOUNTER — Encounter (HOSPITAL_COMMUNITY): Payer: Self-pay | Admitting: Obstetrics and Gynecology

## 2019-05-19 ENCOUNTER — Inpatient Hospital Stay (HOSPITAL_COMMUNITY)
Admission: AD | Admit: 2019-05-19 | Discharge: 2019-05-23 | DRG: 807 | Disposition: A | Discharge status: Home / Self Care | Payer: 59 | Attending: Obstetrics & Gynecology | Admitting: Obstetrics & Gynecology

## 2019-05-19 ENCOUNTER — Other Ambulatory Visit: Payer: Self-pay

## 2019-05-19 ENCOUNTER — Ambulatory Visit (INDEPENDENT_AMBULATORY_CARE_PROVIDER_SITE_OTHER): Payer: 59 | Admitting: Advanced Practice Midwife

## 2019-05-19 VITALS — BP 165/92 | HR 62 | Temp 97.9°F | Wt 175.8 lb

## 2019-05-19 DIAGNOSIS — O3413 Maternal care for benign tumor of corpus uteri, third trimester: Secondary | ICD-10-CM | POA: Diagnosis present

## 2019-05-19 DIAGNOSIS — Z3483 Encounter for supervision of other normal pregnancy, third trimester: Secondary | ICD-10-CM

## 2019-05-19 DIAGNOSIS — O1414 Severe pre-eclampsia complicating childbirth: Principal | ICD-10-CM | POA: Diagnosis present

## 2019-05-19 DIAGNOSIS — Z880 Allergy status to penicillin: Secondary | ICD-10-CM

## 2019-05-19 DIAGNOSIS — O1413 Severe pre-eclampsia, third trimester: Secondary | ICD-10-CM | POA: Diagnosis present

## 2019-05-19 DIAGNOSIS — Z3A36 36 weeks gestation of pregnancy: Secondary | ICD-10-CM | POA: Diagnosis not present

## 2019-05-19 DIAGNOSIS — Z20822 Contact with and (suspected) exposure to covid-19: Secondary | ICD-10-CM | POA: Diagnosis present

## 2019-05-19 DIAGNOSIS — D259 Leiomyoma of uterus, unspecified: Secondary | ICD-10-CM | POA: Diagnosis present

## 2019-05-19 DIAGNOSIS — Z3480 Encounter for supervision of other normal pregnancy, unspecified trimester: Secondary | ICD-10-CM

## 2019-05-19 DIAGNOSIS — Z8659 Personal history of other mental and behavioral disorders: Secondary | ICD-10-CM

## 2019-05-19 DIAGNOSIS — Z3A37 37 weeks gestation of pregnancy: Secondary | ICD-10-CM | POA: Diagnosis not present

## 2019-05-19 HISTORY — DX: Severe pre-eclampsia, third trimester: O14.13

## 2019-05-19 LAB — URINALYSIS, ROUTINE W REFLEX MICROSCOPIC
Bilirubin Urine: NEGATIVE
Glucose, UA: NEGATIVE mg/dL
Hgb urine dipstick: NEGATIVE
Ketones, ur: 5 mg/dL — AB
Nitrite: NEGATIVE
Protein, ur: NEGATIVE mg/dL
Specific Gravity, Urine: 1.021 (ref 1.005–1.030)
pH: 6 (ref 5.0–8.0)

## 2019-05-19 LAB — COMPREHENSIVE METABOLIC PANEL
ALT: 12 U/L (ref 0–44)
AST: 25 U/L (ref 15–41)
Albumin: 2.9 g/dL — ABNORMAL LOW (ref 3.5–5.0)
Alkaline Phosphatase: 94 U/L (ref 38–126)
Anion gap: 11 (ref 5–15)
BUN: 11 mg/dL (ref 6–20)
CO2: 17 mmol/L — ABNORMAL LOW (ref 22–32)
Calcium: 9 mg/dL (ref 8.9–10.3)
Chloride: 107 mmol/L (ref 98–111)
Creatinine, Ser: 0.92 mg/dL (ref 0.44–1.00)
GFR calc Af Amer: >60 mL/min (ref >60)
GFR calc non Af Amer: >60 mL/min (ref >60)
Glucose, Bld: 105 mg/dL — ABNORMAL HIGH (ref 70–99)
Potassium: 3.7 mmol/L (ref 3.5–5.1)
Sodium: 135 mmol/L (ref 135–145)
Total Bilirubin: 0.4 mg/dL (ref 0.3–1.2)
Total Protein: 6 g/dL — ABNORMAL LOW (ref 6.5–8.1)

## 2019-05-19 LAB — CBC
HCT: 36.7 % (ref 36.0–46.0)
Hemoglobin: 12.2 g/dL (ref 12.0–15.0)
MCH: 30.8 pg (ref 26.0–34.0)
MCHC: 33.2 g/dL (ref 30.0–36.0)
MCV: 92.7 fL (ref 80.0–100.0)
Platelets: 214 10*3/uL (ref 150–400)
RBC: 3.96 MIL/uL (ref 3.87–5.11)
RDW: 14.1 % (ref 11.5–15.5)
WBC: 6.4 10*3/uL (ref 4.0–10.5)
nRBC: 0 % (ref 0.0–0.2)

## 2019-05-19 LAB — RESPIRATORY PANEL BY RT PCR (FLU A&B, COVID)
Influenza A by PCR: NEGATIVE
Influenza B by PCR: NEGATIVE
SARS Coronavirus 2 by RT PCR: NEGATIVE

## 2019-05-19 LAB — TYPE AND SCREEN
ABO/RH(D): B POS
Antibody Screen: NEGATIVE

## 2019-05-19 LAB — ABO/RH: ABO/RH(D): B POS

## 2019-05-19 LAB — PROTEIN / CREATININE RATIO, URINE
Creatinine, Urine: 178.94 mg/dL
Protein Creatinine Ratio: 0.12 mg/mg{Cre} (ref 0.00–0.15)
Total Protein, Urine: 22 mg/dL

## 2019-05-19 MED ORDER — ONDANSETRON HCL 4 MG/2ML IJ SOLN: 4.0000 mg | Freq: Four times a day (QID) | INTRAMUSCULAR | Status: DC | PRN

## 2019-05-19 MED ORDER — FENTANYL CITRATE (PF) 100 MCG/2ML IJ SOLN
100.0000 ug | INTRAMUSCULAR | Status: DC | PRN
  Administered 2019-05-19: 100 ug via INTRAVENOUS
  Filled 2019-05-19: qty 2

## 2019-05-19 MED ORDER — MAGNESIUM SULFATE BOLUS VIA INFUSION
4.0000 g | Freq: Once | INTRAVENOUS | Status: AC
  Administered 2019-05-19: 4 g via INTRAVENOUS
  Filled 2019-05-19: qty 1000

## 2019-05-19 MED ORDER — SOD CITRATE-CITRIC ACID 500-334 MG/5ML PO SOLN: 30.0000 mL | ORAL | Status: DC | PRN

## 2019-05-19 MED ORDER — ACETAMINOPHEN 325 MG PO TABS
650.0000 mg | ORAL_TABLET | ORAL | Status: DC | PRN
  Administered 2019-05-20: 650 mg via ORAL
  Filled 2019-05-19: qty 2

## 2019-05-19 MED ORDER — OXYCODONE-ACETAMINOPHEN 5-325 MG PO TABS
1.0000 | ORAL_TABLET | ORAL | Status: DC | PRN
  Administered 2019-05-20: 1 via ORAL
  Filled 2019-05-19: qty 1

## 2019-05-19 MED ORDER — LACTATED RINGERS IV SOLN: INTRAVENOUS | Status: DC

## 2019-05-19 MED ORDER — METOCLOPRAMIDE HCL 5 MG/ML IJ SOLN
10.0000 mg | Freq: Once | INTRAMUSCULAR | Status: AC
  Administered 2019-05-19: 10 mg via INTRAVENOUS
  Filled 2019-05-19: qty 2

## 2019-05-19 MED ORDER — DIPHENHYDRAMINE HCL 50 MG/ML IJ SOLN
25.0000 mg | Freq: Once | INTRAMUSCULAR | Status: AC
  Administered 2019-05-19: 25 mg via INTRAVENOUS
  Filled 2019-05-19: qty 1

## 2019-05-19 MED ORDER — LIDOCAINE HCL (PF) 1 % IJ SOLN
30.0000 mL | INTRAMUSCULAR | Status: AC | PRN
  Administered 2019-05-20: 30 mL via SUBCUTANEOUS
  Filled 2019-05-19: qty 30

## 2019-05-19 MED ORDER — OXYTOCIN 40 UNITS IN NORMAL SALINE INFUSION - SIMPLE MED: 2.5000 [IU]/h | INTRAVENOUS | Status: DC

## 2019-05-19 MED ORDER — LACTATED RINGERS IV SOLN: 500.0000 mL | INTRAVENOUS | Status: DC | PRN

## 2019-05-19 MED ORDER — SERTRALINE HCL 50 MG PO TABS
25.0000 mg | ORAL_TABLET | Freq: Every day | ORAL | Status: DC
  Filled 2019-05-19: qty 1

## 2019-05-19 MED ORDER — MAGNESIUM SULFATE 40 GM/1000ML IV SOLN
2.0000 g/h | INTRAVENOUS | Status: DC
  Filled 2019-05-19: qty 1000

## 2019-05-19 MED ORDER — OXYTOCIN 40 UNITS IN NORMAL SALINE INFUSION - SIMPLE MED
1.0000 m[IU]/min | INTRAVENOUS | Status: DC
  Administered 2019-05-19: 2 m[IU]/min via INTRAVENOUS
  Filled 2019-05-19: qty 1000

## 2019-05-19 MED ORDER — DEXAMETHASONE SODIUM PHOSPHATE 10 MG/ML IJ SOLN
10.0000 mg | Freq: Once | INTRAMUSCULAR | Status: AC
  Administered 2019-05-19: 10 mg via INTRAVENOUS
  Filled 2019-05-19: qty 1

## 2019-05-19 MED ORDER — OXYCODONE-ACETAMINOPHEN 5-325 MG PO TABS: 2.0000 | ORAL_TABLET | ORAL | Status: DC | PRN

## 2019-05-19 MED ORDER — LACTATED RINGERS IV BOLUS (SEPSIS)
1000.0000 mL | Freq: Once | INTRAVENOUS | Status: AC
  Administered 2019-05-19: 1000 mL via INTRAVENOUS

## 2019-05-19 MED ORDER — TERBUTALINE SULFATE 1 MG/ML IJ SOLN: 0.2500 mg | Freq: Once | INTRAMUSCULAR | Status: DC | PRN

## 2019-05-19 MED ORDER — OXYTOCIN BOLUS FROM INFUSION
500.0000 mL | Freq: Once | INTRAVENOUS | Status: AC
  Administered 2019-05-20: 500 mL via INTRAVENOUS

## 2019-05-19 NOTE — MAU Note (Signed)
Pt states that she was seen in the office for a scheduled visit and while she was there had a high blood pressure. Pt states she has also had a headache all day.   Pt reports decreased movement today, but does feel movement.   Denies vaginal bleeding or LOF.

## 2019-05-19 NOTE — MAU Note (Signed)
Covid swab obtained without difficulty and pt tol well. No symptoms 

## 2019-05-19 NOTE — Progress Notes (Signed)
   PRENATAL VISIT NOTE  Subjective:  [name] [name] is a 39 y.o. [id number] at [redacted]w[redacted]d being seen today for ongoing prenatal care.  She is currently monitored for the following issues for this low-risk pregnancy and has Migraine headache; Fibroids; Supervision of other normal pregnancy, antepartum; Uterine fibroid in pregnancy; History of pre-eclampsia in prior pregnancy, currently pregnant; Penicillin allergy; Chronic migraine; Stress at work; Insomnia; Nightmares; Heartburn during pregnancy in third trimester; Dizziness; History of depression; Postpartum anxiety; and Visual disturbances on their problem list.  Patient reports no complaints.  Contractions: Irregular. Vag. Bleeding: None.  Movement: Present. Denies leaking of fluid.   Patient states that she woke up with a headache today. Took tylenol, rested. Around 11:00am she took more tylenol and a nap. Woke up around 1300, and still had headache. She states that she has rested all day today in attempt to keep her blood pressure low.   The following portions of the patient's history were reviewed and updated as appropriate: allergies, current medications, past family history, past medical history, past social history, past surgical history and problem list.   Objective:   Vitals:   05/19/19 1507  BP: (!) 165/92  Pulse: 62  Temp: 97.9 F (36.6 C)  Weight: 175 lb 12.8 oz (79.7 kg)    Fetal Status: Fetal Heart Rate (bpm): 136 Fundal Height: 37 cm Movement: Present     General:  Alert, oriented and cooperative. Patient is in no acute distress.  Skin: Skin is warm and dry. No rash noted.   Cardiovascular: Normal heart rate noted  Respiratory: Normal respiratory effort, no problems with respiration noted  Abdomen: Soft, gravid, appropriate for gestational age.  Pain/Pressure: Present     Pelvic: Cervical exam performed        Extremities: Normal range of motion.  Edema: Trace  Mental Status: Normal mood and affect. Normal behavior. Normal  judgment and thought content.   Assessment and Plan:  Pregnancy: [id number] at [redacted]w[redacted]d 1. Supervision of other normal pregnancy, antepartum - hypertensive today, severe range - bp was elevated at last visit - patient with hx of pre-eclampsia - reports headache today - Patient to MAU, MAU provider notified   Term labor symptoms and general obstetric precautions including but not limited to vaginal bleeding, contractions, leaking of fluid and fetal movement were reviewed in detail with the patient. Please refer to After Visit Summary for other counseling recommendations.   Return in about 1 week (around 05/26/2019).  Future Appointments  Date Time Provider [place]  05/21/2019  9:30 AM Monico Hoar, PT [facility]  05/26/2019  2:30 PM Lajean Manes, CNM [facility] None   Marcille Buffy DNP, CNM  05/19/19  3:54 PM

## 2019-05-19 NOTE — H&P (Signed)
[name] [name] is a 39 y.o. female [phone] @[redacted]w[redacted]d  presenting for IOL for preeclampsia with severe features based on severe range BP and headache. She presented to the office on 05/19/19 with headache and severe range BP.  In MAU, her headache was reduced but not resolved with headache cocktail and preeclampsia labs were initiated.  FHR tracing was overall Category I but with a 4 minute prolonged deceleration x 1 that resolved with position change.     Nursing Staff Provider  Office Location  [facility] Dating  U/S @ 7.1w  Language  English Anatomy US  Ordered   Flu Vaccine  Declined 03/18/2019 Genetic Screen  NIPS: declined   AFP:   First Screen:  Quad:   TDaP vaccine   03/18/2019 Hgb A1C or  GTT Early    A1C 4.8 Third trimester   72/130/110  Rhogam  N/A   LAB RESULTS   Feeding Plan Breast Blood Type   B positive  Contraception None Antibody  negative  Circumcision Undecided, given price list Rubella  immune  Pediatrician  [facility] RPR   non reactive  Support Person Husband- [name]  HBsAg  non reactive  Prenatal Classes Info given HIV Non Reactive (09/09 1757)  BTL Consent N/A GBS  (For PCN allergy, check sensitivities)   VBAC Consent N/A Pap  11/2018, NIL    Hgb Electro   negative  BP Cuff Has cuff CF Negative     SMA     Waterbirth  [ ]  Class [ ]  Consent [ ]  CNM visit    OB History    Gravida  5   Para  1   Term  1   Preterm      AB  3   Living  1     SAB  2   TAB      Ectopic      Multiple  0   Live Births  1          Past Medical History:  Diagnosis Date  . Anemia   . Depression   . Gestational hypertension 2017  . Headache    Migraines  . Kidney infection 2009   Past Surgical History:  Procedure Laterality Date  . DILATION AND EVACUATION N/A 11/27/2017   Procedure: DILATATION AND EVACUATION;  Surgeon: Osborne Oman, MD;  Location: [place];  Service: Gynecology;  Laterality: N/A;  . FEMUR FRACTURE SURGERY Left    metal rod  . LEG SURGERY  Left 1999   steel rod in left femur bone    Family History: family history includes Heart disease in her maternal aunt, maternal grandfather, maternal uncle, paternal aunt, and paternal grandfather; Hyperlipidemia in her mother. Social History:  reports that she has never smoked. She has never used smokeless tobacco. She reports previous alcohol use. She reports that she does not use drugs.     Maternal Diabetes: No Genetic Screening: Declined Maternal Ultrasounds/Referrals: Normal Fetal Ultrasounds or other Referrals:  None Maternal Substance Abuse:  No Significant Maternal Medications:  None Significant Maternal Lab Results:  Group B Strep negative Other Comments:  None  Review of Systems  Constitutional: Negative for chills, fatigue and fever.  Eyes: Negative for visual disturbance.  Respiratory: Negative for shortness of breath.   Cardiovascular: Negative for chest pain.  Gastrointestinal: Negative for abdominal pain and vomiting.  Genitourinary: Negative for difficulty urinating, dysuria, flank pain, pelvic pain, vaginal bleeding, vaginal discharge and vaginal pain.  Neurological: Positive for headaches. Negative  for dizziness.  Psychiatric/Behavioral: Negative.    Maternal Medical History:  Reason for admission: HTN, headache   Contractions: Frequency: rare.   Perceived severity is mild.    Fetal activity: Perceived fetal activity is normal.   Last perceived fetal movement was within the past hour.    Prenatal complications: PIH.   Prenatal Complications - Diabetes: none.    Dilation: 4 Effacement (%): 50 Station: -2 Exam by:: Fatima Blank, CNM Blood pressure 140/79, pulse (!) 59, temperature 98.6 F (37 C), resp. rate 18, last menstrual period 09/03/2018, SpO2 96 %. Maternal Exam:  Uterine Assessment: Contraction strength is mild.  Contraction frequency is rare.   Abdomen: Fetal presentation: vertex  Pelvis: adequate for delivery.   Cervix: Cervix  evaluated by digital exam.     Fetal Exam Fetal Monitor Review: Mode: ultrasound.   Baseline rate: 135.  Variability: moderate (6-25 bpm).   Pattern: accelerations present and prolonged decelerations.    Fetal State Assessment: Category II - tracings are indeterminate.     Physical Exam  Nursing note and vitals reviewed. Constitutional: She is oriented to person, place, and time. She appears well-developed and well-nourished.  Cardiovascular: Normal rate, regular rhythm and normal heart sounds.  Respiratory: Effort normal and breath sounds normal.  GI: Soft.  Musculoskeletal:        General: Normal range of motion.     Cervical back: Normal range of motion.  Neurological: She is alert and oriented to person, place, and time.  Skin: Skin is warm and dry.  Psychiatric: She has a normal mood and affect. Her behavior is normal. Judgment and thought content normal.    Prenatal labs: ABO, Rh: B/Positive/-- (09/02 1026) Antibody: Negative (09/02 1026) Rubella: 4.05 (09/02 1026) RPR: Non Reactive (12/31 0831)  HBsAg: Negative (09/02 1026)  HIV: Non Reactive (12/31 0831)  GBS: Negative/-- (02/24 1555)   Assessment/Plan: 39 y.o. [id number] @[redacted]w[redacted]d  Preeclamsia with severe features GBS negative  Admit for IOL  Magnesium sulfate 4 g bolus then 2g/hour COVID swab pending Pitocin to start at 2 mu/min, increase by 2 per protocol   Fatima Blank 05/19/2019, 7:55 PM

## 2019-05-19 NOTE — Progress Notes (Signed)
Patient ID: [name] [name], female   DOB: [birth year], 39 y.o.   MRN: [id number]  Doing well; feels H/A is now a 1-2/10, so improved from earlier; mag bolus has been given  BP 147/95, 138/80, P 65 FHR 140s, +accels, no decels Ctx irreg Cx very post/3+/thick/vtx -3  IUP@36 .6wks Pre-e w/ SF GBS neg Cx favorable  Creatinine: 0.92  Will begin Pit 2x2 and titrate to achieve active labor   Myrtis Ser CNM 05/19/2019 10:31 PM

## 2019-05-20 ENCOUNTER — Encounter (HOSPITAL_COMMUNITY): Payer: Self-pay | Admitting: Obstetrics and Gynecology

## 2019-05-20 DIAGNOSIS — O1414 Severe pre-eclampsia complicating childbirth: Secondary | ICD-10-CM

## 2019-05-20 DIAGNOSIS — Z3A37 37 weeks gestation of pregnancy: Secondary | ICD-10-CM

## 2019-05-20 LAB — RPR: RPR Ser Ql: NONREACTIVE

## 2019-05-20 MED ORDER — TETANUS-DIPHTH-ACELL PERTUSSIS 5-2.5-18.5 LF-MCG/0.5 IM SUSP: 0.5000 mL | Freq: Once | INTRAMUSCULAR | Status: DC

## 2019-05-20 MED ORDER — MEASLES, MUMPS & RUBELLA VAC IJ SOLR: 0.5000 mL | Freq: Once | INTRAMUSCULAR | Status: DC

## 2019-05-20 MED ORDER — SERTRALINE HCL 50 MG PO TABS: 25.0000 mg | ORAL_TABLET | Freq: Every day | ORAL | Status: DC

## 2019-05-20 MED ORDER — ZOLPIDEM TARTRATE 5 MG PO TABS: 5.0000 mg | ORAL_TABLET | Freq: Every evening | ORAL | Status: DC | PRN

## 2019-05-20 MED ORDER — SERTRALINE HCL 50 MG PO TABS
25.0000 mg | ORAL_TABLET | Freq: Every day | ORAL | Status: DC
  Administered 2019-05-20 – 2019-05-22 (×3): 25 mg via ORAL
  Filled 2019-05-20 (×3): qty 1

## 2019-05-20 MED ORDER — IBUPROFEN 600 MG PO TABS
600.0000 mg | ORAL_TABLET | Freq: Four times a day (QID) | ORAL | Status: DC
  Administered 2019-05-20 – 2019-05-23 (×13): 600 mg via ORAL
  Filled 2019-05-20 (×13): qty 1

## 2019-05-20 MED ORDER — ONDANSETRON HCL 4 MG PO TABS: 4.0000 mg | ORAL_TABLET | ORAL | Status: DC | PRN

## 2019-05-20 MED ORDER — SOD CITRATE-CITRIC ACID 500-334 MG/5ML PO SOLN
ORAL | Status: AC
  Filled 2019-05-20: qty 15

## 2019-05-20 MED ORDER — SIMETHICONE 80 MG PO CHEW: 80.0000 mg | CHEWABLE_TABLET | ORAL | Status: DC | PRN

## 2019-05-20 MED ORDER — PROMETHAZINE HCL 25 MG/ML IJ SOLN
12.5000 mg | Freq: Once | INTRAMUSCULAR | Status: AC
  Administered 2019-05-20: 12.5 mg via INTRAVENOUS
  Filled 2019-05-20: qty 1

## 2019-05-20 MED ORDER — MAGNESIUM SULFATE 40 GM/1000ML IV SOLN
2.0000 g/h | INTRAVENOUS | Status: DC
  Administered 2019-05-20: 2 g/h via INTRAVENOUS
  Filled 2019-05-20: qty 1000

## 2019-05-20 MED ORDER — DIBUCAINE (PERIANAL) 1 % EX OINT: 1.0000 "application " | TOPICAL_OINTMENT | CUTANEOUS | Status: DC | PRN

## 2019-05-20 MED ORDER — WITCH HAZEL-GLYCERIN EX PADS: 1.0000 "application " | MEDICATED_PAD | CUTANEOUS | Status: DC | PRN

## 2019-05-20 MED ORDER — OXYCODONE HCL 5 MG PO TABS
5.0000 mg | ORAL_TABLET | ORAL | Status: DC | PRN
  Administered 2019-05-20 – 2019-05-21 (×3): 5 mg via ORAL
  Filled 2019-05-20 (×3): qty 1

## 2019-05-20 MED ORDER — ONDANSETRON HCL 4 MG/2ML IJ SOLN: 4.0000 mg | INTRAMUSCULAR | Status: DC | PRN

## 2019-05-20 MED ORDER — BENZOCAINE-MENTHOL 20-0.5 % EX AERO: 1.0000 "application " | INHALATION_SPRAY | CUTANEOUS | Status: DC | PRN

## 2019-05-20 MED ORDER — SENNOSIDES-DOCUSATE SODIUM 8.6-50 MG PO TABS
2.0000 | ORAL_TABLET | ORAL | Status: DC
  Administered 2019-05-21 – 2019-05-22 (×3): 2 via ORAL
  Filled 2019-05-20 (×3): qty 2

## 2019-05-20 MED ORDER — LACTATED RINGERS IV SOLN: INTRAVENOUS | Status: DC

## 2019-05-20 MED ORDER — BUTORPHANOL TARTRATE 1 MG/ML IJ SOLN
2.0000 mg | Freq: Once | INTRAMUSCULAR | Status: DC
  Filled 2019-05-20: qty 2

## 2019-05-20 MED ORDER — ACETAMINOPHEN 325 MG PO TABS
650.0000 mg | ORAL_TABLET | ORAL | Status: DC | PRN
  Administered 2019-05-20 – 2019-05-22 (×5): 650 mg via ORAL
  Filled 2019-05-20 (×5): qty 2

## 2019-05-20 MED ORDER — DIPHENHYDRAMINE HCL 25 MG PO CAPS: 25.0000 mg | ORAL_CAPSULE | Freq: Four times a day (QID) | ORAL | Status: DC | PRN

## 2019-05-20 MED ORDER — PRENATAL MULTIVITAMIN CH
1.0000 | ORAL_TABLET | Freq: Every day | ORAL | Status: DC
  Administered 2019-05-20 – 2019-05-23 (×4): 1 via ORAL
  Filled 2019-05-20 (×4): qty 1

## 2019-05-20 MED ORDER — COCONUT OIL OIL: 1.0000 "application " | TOPICAL_OIL | Status: DC | PRN

## 2019-05-20 NOTE — Progress Notes (Signed)
Patient ID: [name] [name], female   DOB: October 08, 1980, 39 y.o.   MRN: [id number]  Spoke with RN outside room; pt resting; has had a dose of Fentanyl at 2347  BPs 153/96, 160/91, 164/96 FHR 120s, +accels, no decels, Cat 1 Ctx irreg 3-6 mins with Pit at 69mu/min Cx not reexamined  IUP@37 .0wks Pre-e w/ SF IOL process GBS neg  Continue to increase Pit to achieve active labor If BPs consistently >160/110 will tx with IV antihypertensives Anticipate vag del  Myrtis Ser CNM 05/20/2019

## 2019-05-20 NOTE — Lactation Note (Signed)
This note was copied from a baby's chart. Lactation Consultation Note  Patient Name: [name] [name] Today's Date: 05/20/2019 Reason for consult: Initial assessment;Early term 37-38.6wks P2.  Mom breastfed her first baby for 2 years and 10 months.  Newborn is 3 hours old and has been to breast once.  Discussed early term behavior and recommended pumping if baby sleepy or not feeding well.  Instructed to feed with any feeding cue and call for assist prn.  Skin to skin encouraged.  Mom denies questions or concerns.  Breastfeeding consultation services information given.  Maternal Data Has patient been taught Hand Expression?: Yes Does the patient have breastfeeding experience prior to this delivery?: Yes  Feeding Feeding Type: Breast Fed  LATCH Score Latch: Repeated attempts needed to sustain latch, nipple held in mouth throughout feeding, stimulation needed to elicit sucking reflex.  Audible Swallowing: None  Type of Nipple: Everted at rest and after stimulation  Comfort (Breast/Nipple): Soft / non-tender  Hold (Positioning): No assistance needed to correctly position infant at breast.  LATCH Score: 7  Interventions    Lactation Tools Discussed/Used     Consult Status Consult Status: Follow-up Date: 05/21/19 Follow-up type: In-patient    [place] 05/20/2019, 8:43 AM

## 2019-05-20 NOTE — Discharge Summary (Signed)
Postpartum Discharge Summary  Date of Service updated     Patient Name: [name] [name] DOB: 02-10-1981 MRN: [id number]  Date of admission: 05/19/2019 Delivering Provider: Serita Grammes D   Date of discharge: 05/23/2019  Admitting diagnosis: Preeclampsia, severe, third trimester [O14.13] Intrauterine pregnancy: [redacted]w[redacted]d    Secondary diagnosis:  Active Problems:   Uterine fibroid in pregnancy   History of depression   Preeclampsia, severe, third trimester  Additional problems: none     Discharge diagnosis: Term Pregnancy Delivered and Preeclampsia (severe)                                                                                                Post partum procedures:magnesium  Augmentation: Pitocin  Complications: None  Hospital course:  Induction of Labor With Vaginal Delivery   39y.o. yo [id number] admitted to the hospital 05/19/2019 for induction of labor.  Indication for induction: Preeclampsia- severe features (BPs and H/A) with neg pre-e labwork. The magnesium bolus was given and then Pitocin started, and she progressed to delivery approx 7hrs later.    Membrane Rupture Time/Date: 4:41 AM ,05/20/2019   Intrapartum Procedures: Episiotomy: None [1]                                         Lacerations:  1st degree [2];Perineal [11]  Patient had delivery of a Viable infant.  Information for the patient's newborn:  Pr[place], [id number][[id number]]Delivery Method: Vag-Spont    05/20/2019  Details of delivery can be found in separate delivery note.  Patient had a postpartum course remarkable for receiving mag x 24h PP. Her BPs were levated and she was begun on Norvasc PPD 2 Patient is discharged home 05/23/19. Delivery time: 4:48 AM    Magnesium Sulfate received: Yes BMZ received: No Rhophylac:N/A MMR:N/A Transfusion:No  Physical exam  Vitals:   05/22/19 1649 05/22/19 1919 05/22/19 2258 05/23/19 0355  BP: 134/78 126/76 (!) 140/102 134/71  Pulse: 72 63 68 (!)  59  Resp: '16 18 18 16  ' Temp:  98.2 F (36.8 C) 98.4 F (36.9 C) 98 F (36.7 C)  TempSrc:  Oral Oral Oral  SpO2: 99% 99% 98% 98%  Weight:      Height:       General: alert, cooperative and no distress Lochia: appropriate Uterine Fundus: firm Incision: N/A DVT Evaluation: No evidence of DVT seen on physical exam. Labs: Lab Results  Component Value Date   WBC 6.4 05/19/2019   HGB 12.2 05/19/2019   HCT 36.7 05/19/2019   MCV 92.7 05/19/2019   PLT 214 05/19/2019   CMP Latest Ref Rng & Units 05/19/2019  Glucose 70 - 99 mg/dL 105(H)  BUN 6 - 20 mg/dL 11  Creatinine 0.44 - 1.00 mg/dL 0.92  Sodium 135 - 145 mmol/L 135  Potassium 3.5 - 5.1 mmol/L 3.7  Chloride 98 - 111 mmol/L 107  CO2 22 - 32 mmol/L 17(L)  Calcium 8.9 - 10.3 mg/dL 9.0  Total  Protein 6.5 - 8.1 g/dL 6.0(L)  Total Bilirubin 0.3 - 1.2 mg/dL 0.4  Alkaline Phos 38 - 126 U/L 94  AST 15 - 41 U/L 25  ALT 0 - 44 U/L 12   Edinburgh Score: Edinburgh Postnatal Depression Scale Screening Tool 05/22/2019  I have been able to laugh and see the funny side of things. 0  I have looked forward with enjoyment to things. 0  I have blamed myself unnecessarily when things went wrong. 0  I have been anxious or worried for no good reason. 0  I have felt scared or panicky for no good reason. 1  Things have been getting on top of me. 1  I have been so unhappy that I have had difficulty sleeping. 1  I have felt sad or miserable. 0  I have been so unhappy that I have been crying. 0  The thought of harming myself has occurred to me. 0  Edinburgh Postnatal Depression Scale Total 3    Discharge instruction: per After Visit Summary and "Baby and Me Booklet".  After visit meds:  Allergies as of 05/23/2019      Reactions   Bactrim [sulfamethoxazole-trimethoprim] Hives, Swelling   Dust Mite Extract Hives   Penicillins Other (See Comments)   Fever-pt was really sick. Has patient had a PCN reaction causing immediate rash,  facial/tongue/throat swelling, SOB or lightheadedness with hypotension: No Has patient had a PCN reaction causing severe rash involving mucus membranes or skin necrosis: No Has patient had a PCN reaction that required hospitalization Yes Has patient had a PCN reaction occurring within the last 10 years: No If all of the above answers are "NO", then may proceed with Cephalosporin use.      Medication List    TAKE these medications   amLODipine 10 MG tablet Commonly known as: NORVASC Take 1 tablet (10 mg total) by mouth daily.   aspirin EC 81 MG tablet Take 1 tablet (81 mg total) by mouth daily.   ibuprofen 600 MG tablet Commonly known as: ADVIL Take 1 tablet (600 mg total) by mouth every 6 (six) hours.   NON FORMULARY Take 2 tablets by mouth daily. Blood builders   omeprazole 20 MG capsule Commonly known as: PriLOSEC Take 1 capsule (20 mg total) by mouth 2 (two) times daily before a meal.   prenatal multivitamin Tabs tablet Take 1 tablet by mouth daily at 12 noon.   sertraline 25 MG tablet Commonly known as: Zoloft Take 1 tablet (25 mg total) by mouth daily. What changed: Another medication with the same name was added. Make sure you understand how and when to take each.   sertraline 25 MG tablet Commonly known as: ZOLOFT Take 1 tablet (25 mg total) by mouth at bedtime. What changed: You were already taking a medication with the same name, and this prescription was added. Make sure you understand how and when to take each.       Diet: routine diet  Activity: Advance as tolerated. Pelvic rest for 6 weeks.   Outpatient follow up:BP check 1wk; PP visit 4wks Follow up Appt: No future appointments. Follow up Visit:  Please schedule this patient for Postpartum visit in: 4 weeks with the following provider: Any provider Virtual For C/S patients schedule nurse incision check in weeks 2 weeks: no High risk pregnancy complicated by: pre-e Delivery mode:  SVD Anticipated  Birth Control:  none PP Procedures needed: BP check- 1wk Schedule Integrated BH visit: no   Newborn Data: Live  born female  Birth Weight: 2846gm (6lb 4.4oz)  APGAR: 9, 9  Newborn Delivery   Birth date/time: 05/20/2019 04:48:00 Delivery type: Vaginal, Spontaneous      Baby Feeding: Breast Disposition:home with mother   05/23/2019 Florian Buff, MD

## 2019-05-20 NOTE — Progress Notes (Signed)
MOB was referred for history of depression/anxiety. * Referral screened out by Clinical Social Worker because none of the following criteria appear to apply: ~ History of anxiety/depression during this pregnancy, or of post-partum depression following prior delivery. ~ Diagnosis of anxiety and/or depression within last 3 years OR * MOB's symptoms currently being treated with medication and/or therapy. MOB has an active Rx for Zoloft.   Please contact the Clinical Social Worker if needs arise, by MOB request, or if MOB scores greater than 9/yes to question 10 on Edinburgh Postpartum Depression Screen.  Gio Janoski Boyd-Gilyard, MSW, LCSW Clinical Social Work [phone]  

## 2019-05-21 ENCOUNTER — Ambulatory Visit: Payer: 59 | Admitting: Physical Therapy

## 2019-05-21 NOTE — Lactation Note (Signed)
This note was copied from a baby's chart. Lactation Consultation Note Baby 44 hrs old.  Baby had 6% weight loss. Baby had not been spitting up and only one stool and void.  Mom stated baby is BF great as well as Therapist, sports.  Mom has severe head ache room dark and kept her eyes mostly closed. MGM at bedside tending to baby. [place] asked mom if FOB took a picture of Birth wt. Mom didn't know. MGM stated that Dr. Michela Pitcher length was put in wrong and the Dr. Council Mechanic and measured the baby herself. [place] wonders if one of these wt. Are wrong? Mom isn't keeping up w/how long baby is BF because she said the meds has her messed up. Asked if MGM could that it is important. [place] asked mom if I could do hand expression to check for colostrum mom stated yes. Colostrum easily expressed. Suggested to mom to use DEBP mom declined.  Encouraged mom to call for assistance or questions. LC will f/u.  Patient Name: [name] [name] Today's Date: 05/21/2019     Maternal Data    Feeding    LATCH Score                   Interventions    Lactation Tools Discussed/Used     Consult Status      Theodoro Kalata 05/21/2019, 6:32 AM

## 2019-05-21 NOTE — Lactation Note (Signed)
This note was copied from a baby's chart. Lactation Consultation Note  Patient Name: [name] [name] Today's Date: 05/21/2019 Reason for consult: Follow-up assessment;Infant weight loss;Early term 37-38.6wks  Baby born at 37 weeks and is 4 hrs old with 6.2% weight loss.  Mom called for latch assist/assessment.    Mom had baby supported in her lap and hunched over baby.   Talked about using pillow support to elevate baby closer to the breast height.    Baby opened his mouth and latched deeply.  Mom denies any discomfort.  Baby having regular suck/swallowing noted, with deep jaw extension.  Demonstrated breast compression during feeding to increase milk transfer, baby's eyebrows elevated and baby swallowed more with this.  Mom nodding, but didn't support her breast and use compressions.  Encouraged Mom to hand express and offer colostrum into spoon.  Reviewed how to do this.  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    Encouraged continued STS and offering the breast often with cues.  Goal is >8 times per 24 hrs.    Feeding Feeding Type: Breast Fed  LATCH Score Latch: Grasps breast easily, tongue down, lips flanged, rhythmical sucking.  Audible Swallowing: Spontaneous and intermittent  Type of Nipple: Everted at rest and after stimulation  Comfort (Breast/Nipple): Soft / non-tender  Hold (Positioning): Assistance needed to correctly position infant at breast and maintain latch.  LATCH Score: 9  Interventions Interventions: Breast feeding basics reviewed;Assisted with latch;Skin to skin;Breast massage;Breast compression;Adjust position;Support pillows    Consult Status Consult Status: Follow-up Date: 05/22/19 Follow-up type: In-patient    [name] 05/21/2019, 1:42 PM

## 2019-05-21 NOTE — Lactation Note (Addendum)
This note was copied from a baby's chart. Lactation Consultation Note  Patient Name: Boy [place] Wich Today's Date: 05/21/2019   Infant is 63 hrs old. Infant is peacefully sleeping next to mom. Mom reports she had just finished a feeding and he had unlatched on his own. Mom reports that nursing is going well: she reports regular swallows, cramping, a slight let-down sensation, & no pain. Mom can easily express her own colostrum. Mom reports an abundant supply with her 1st child.   Mom reports having to use a nipple shield briefly with her 1st child, but that won't be necessary with this infant. Mom reports that her Spectra S2 is on its way from the insurance company. Mom has no questions for Lactation at this time.  I asked Mom to call nursing staff or Lactation to receive a LATCH score at a subsequent feeding. Evert Kohl, RN was given an update.  Matthias Hughs [facility] 05/21/2019, 9:55 AM

## 2019-05-21 NOTE — Lactation Note (Signed)
This note was copied from a baby's chart. Lactation Consultation Note  Patient Name: [name] [name] Today's Date: 05/21/2019   Infant is 41 hrs old. Mom was sleeping soundly (infant was sleeping peacefully in bassinet). MGM came out into hallway (so as not to disturb mother) with me to discuss how things have been going. MGM feels that infant has been breastfeeding well.   No LATCH score has been done in greater than 24 hrs (and last LATCH score was not favorable). Since infant is 37 weeks with documented weight loss at 6.2%, I asked MGM to have Mom call out for me to observe a latch.  MGM will begin documenting times and duration of feeds & output. MGM is aware that we have Gem if Mom needs to continue sleeping and infant is hungry.   Note: Mom's 1st child was born at 44 weeks.   Matthias Hughs [facility] 05/21/2019, 7:59 AM

## 2019-05-21 NOTE — Progress Notes (Signed)
Post Partum Day 1 Subjective: no complaints, up ad lib, voiding and tolerating PO  She would like to go home when possible. However, the baby lost weight and she is aware that Peds may want to keep the baby here.   Objective: Blood pressure 118/70, pulse 70, temperature 97.9 F (36.6 C), temperature source Oral, resp. rate 18, height 5\' 1"  (1.549 m), weight 79.7 kg, last menstrual period 09/03/2018, SpO2 98 %, unknown if currently breastfeeding.  Physical Exam:  General: alert Lochia: appropriate Uterine Fundus: firm and non-tender at U-1 DVT Evaluation: No evidence of DVT seen on physical exam.  Recent Labs    05/19/19 1843  HGB 12.2  HCT 36.7    Assessment/Plan: Plan for discharge tomorrow  Magnesium to be stopped now.   LOS: 2 days   Emily Filbert 05/21/2019, 7:43 AM

## 2019-05-22 MED ORDER — AMLODIPINE BESYLATE 10 MG PO TABS
10.0000 mg | ORAL_TABLET | Freq: Every day | ORAL | Status: DC
  Administered 2019-05-22 – 2019-05-23 (×2): 10 mg via ORAL
  Filled 2019-05-22 (×2): qty 1

## 2019-05-22 NOTE — Plan of Care (Signed)
  Problem: Clinical Measurements: Goal: Cardiovascular complication will be avoided Outcome: Progressing   Problem: Education: Goal: Knowledge of General Education information will improve Description: Including pain rating scale, medication(s)/side effects and non-pharmacologic comfort measures Outcome: Completed/Met   Problem: Health Behavior/Discharge Planning: Goal: Ability to manage health-related needs will improve Outcome: Completed/Met   Problem: Clinical Measurements: Goal: Respiratory complications will improve Outcome: Completed/Met   Problem: Activity: Goal: Risk for activity intolerance will decrease Outcome: Completed/Met   Problem: Nutrition: Goal: Adequate nutrition will be maintained Outcome: Completed/Met   Problem: Coping: Goal: Level of anxiety will decrease Outcome: Completed/Met   Problem: Elimination: Goal: Will not experience complications related to bowel motility Outcome: Completed/Met   Problem: Pain Managment: Goal: General experience of comfort will improve Outcome: Completed/Met   Problem: Safety: Goal: Ability to remain free from injury will improve Outcome: Completed/Met   Problem: Skin Integrity: Goal: Risk for impaired skin integrity will decrease Outcome: Completed/Met   Problem: Education: Goal: Knowledge of disease or condition will improve Outcome: Completed/Met Goal: Knowledge of the prescribed therapeutic regimen will improve Outcome: Completed/Met   Problem: Clinical Measurements: Goal: Complications related to disease process, condition or treatment will be avoided or minimized Outcome: Completed/Met   Problem: Education: Goal: Knowledge of condition will improve Outcome: Completed/Met Goal: Individualized Educational Video(s) Outcome: Completed/Met Goal: Individualized Newborn Educational Video(s) Outcome: Completed/Met   Problem: Activity: Goal: Will verbalize the importance of balancing activity with adequate  rest periods Outcome: Completed/Met Goal: Ability to tolerate increased activity will improve Outcome: Completed/Met   Problem: Coping: Goal: Ability to identify and utilize available resources and services will improve Outcome: Completed/Met   Problem: Life Cycle: Goal: Chance of risk for complications during the postpartum period will decrease Outcome: Completed/Met   Problem: Role Relationship: Goal: Ability to demonstrate positive interaction with newborn will improve Outcome: Completed/Met

## 2019-05-22 NOTE — Plan of Care (Signed)
  Problem: Clinical Measurements: Goal: Cardiovascular complication will be avoided Outcome: Progressing   Problem: Education: Goal: Knowledge of General Education information will improve Description: Including pain rating scale, medication(s)/side effects and non-pharmacologic comfort measures Outcome: Completed/Met   Problem: Health Behavior/Discharge Planning: Goal: Ability to manage health-related needs will improve Outcome: Completed/Met   Problem: Clinical Measurements: Goal: Respiratory complications will improve Outcome: Completed/Met   Problem: Activity: Goal: Risk for activity intolerance will decrease Outcome: Completed/Met   Problem: Nutrition: Goal: Adequate nutrition will be maintained Outcome: Completed/Met   Problem: Coping: Goal: Level of anxiety will decrease Outcome: Completed/Met   Problem: Elimination: Goal: Will not experience complications related to bowel motility Outcome: Completed/Met   Problem: Pain Managment: Goal: General experience of comfort will improve Outcome: Completed/Met   Problem: Safety: Goal: Ability to remain free from injury will improve Outcome: Completed/Met   Problem: Skin Integrity: Goal: Risk for impaired skin integrity will decrease Outcome: Completed/Met

## 2019-05-22 NOTE — Progress Notes (Signed)
Subjective: Postpartum Day 2: Vaginal delivery Patient reports no problems voiding.    Objective: Vital signs in last 24 hours: Temp:  [98.1 F (36.7 C)-98.7 F (37.1 C)] 98.5 F (36.9 C) (03/05 2256) Pulse Rate:  [56-86] 59 (03/05 2256) Resp:  [18] 18 (03/05 2256) BP: (119-154)/(71-82) 151/72 (03/05 2256) SpO2:  [99 %-100 %] 100 % (03/05 2256)  Physical Exam:  General: alert, cooperative and no distress Lochia: appropriate Uterine Fundus: firm Incision:  DVT Evaluation: No evidence of DVT seen on physical exam.  Recent Labs    05/19/19 1843  HGB 12.2  HCT 36.7    Assessment/Plan: S/p Vag delivery severe pre eclampsia Begin Norvasc 10 for BP management Mertie Clause Taejon Irani 05/22/2019, 7:26 AM

## 2019-05-22 NOTE — Lactation Note (Addendum)
This note was copied from a baby's chart. Lactation Consultation Note:  P2, infant is 52 hours old and 7% wt loss. Mother reports that infant just finished breastfeeding. Infant sleeping in mothers arms.   Mother reports that she has had some bleeding on the tip of her left nipple.  Mother denies pain when infant is latched. She reports that her nurse gave her coconut oil.   Observed a tiny scab in the center of her lt nipple. Mother was given comfort gels to use .   Mother reports that due to a medication change for her BP she will not be discharged today.  Encouraged mother to continue to cue base feed infant and breastfeed at least 8-12 or more times in 24 hours.   Mother is aware of available [place] services.   Patient Name: [name] [name] Today's Date: 05/22/2019 Reason for consult: Follow-up assessment   Maternal Data    Feeding Feeding Type: Breast Fed  LATCH Score                   Interventions Interventions: Comfort gels  Lactation Tools Discussed/Used     Consult Status Consult Status: Follow-up Date: 05/23/19 Follow-up type: In-patient    Jess Barters [facility] 05/22/2019, 10:50 AM

## 2019-05-23 MED ORDER — AMLODIPINE BESYLATE 10 MG PO TABS: 10.0000 mg | ORAL_TABLET | Freq: Every day | ORAL | 1 refills | Status: DC

## 2019-05-23 MED ORDER — IBUPROFEN 600 MG PO TABS: 600.0000 mg | ORAL_TABLET | Freq: Four times a day (QID) | ORAL | 0 refills | Status: DC

## 2019-05-23 MED ORDER — SERTRALINE HCL 25 MG PO TABS: 25.0000 mg | ORAL_TABLET | Freq: Every day | ORAL | 2 refills | Status: DC

## 2019-05-26 ENCOUNTER — Encounter: Payer: 59 | Admitting: Certified Nurse Midwife

## 2019-06-01 ENCOUNTER — Ambulatory Visit (INDEPENDENT_AMBULATORY_CARE_PROVIDER_SITE_OTHER): Payer: 59 | Admitting: *Deleted

## 2019-06-01 ENCOUNTER — Other Ambulatory Visit: Payer: Self-pay

## 2019-06-01 VITALS — BP 131/83 | HR 88 | Temp 98.0°F | Ht 61.0 in | Wt 162.0 lb

## 2019-06-01 DIAGNOSIS — R300 Dysuria: Secondary | ICD-10-CM | POA: Diagnosis not present

## 2019-06-01 DIAGNOSIS — O165 Unspecified maternal hypertension, complicating the puerperium: Secondary | ICD-10-CM | POA: Diagnosis not present

## 2019-06-01 LAB — POCT URINALYSIS DIPSTICK (MANUAL)
Nitrite, UA: NEGATIVE
Poct Bilirubin: NEGATIVE
Poct Glucose: NORMAL mg/dL
Poct Urobilinogen: NORMAL mg/dL
Spec Grav, UA: 1.025 (ref 1.010–1.025)
pH, UA: 6.5 (ref 5.0–8.0)

## 2019-06-01 MED ORDER — LISINOPRIL 10 MG PO TABS: 10.0000 mg | ORAL_TABLET | Freq: Every day | ORAL | 0 refills | Status: DC

## 2019-06-01 NOTE — Progress Notes (Signed)
   Subjective:  [name] [name] is a 39 y.o. female here for BP check s/p vaginal delivery on 05/20/2019 at 37w gestation due to preeclampsia. Patient also has complaints of dysuria and burning x 1 day.  Hypertension ROS: taking medications as instructed, no medication side effects noted, home BP monitoring in range of [id number] systolic over [id number] diastolic, no TIA's, no chest pain on exertion, no dyspnea on exertion and no swelling of ankles.    Objective:  BP 131/83 (BP Location: Right Arm, Patient Position: Sitting, Cuff Size: Normal)   Pulse 88   Temp 98 F (36.7 C) (Oral)   Ht 5\' 1"  (1.549 m)   Wt 162 lb (73.5 kg)   BMI 30.61 kg/m   Today's Vitals   06/01/19 1359 06/01/19 1415  BP: (!) 166/87 131/83  Pulse: 88   Temp: 98 F (36.7 C)   TempSrc: Oral   Weight: 162 lb (73.5 kg)   Height: 5\' 1"  (1.549 m)    Appearance alert, well appearing, and in no distress, oriented to person, place, and time and normal appearing weight. Urine dipstick shows positive for RBC's, positive for protein, positive for leukocytes and positive for ketones.   BP readings from home: 05/27/19 9:23 AM 138/88 05/28/19 8:35 AM 124/88; 4:30 PM 138/87 05/29/19 3:44 PM 134/81 05/30/19 (dizzy) 11:30 AM 158/110; 3 PM 126/75 05/31/19 2 PM 137/91; 10:20 PM 138/88 06/01/19 7:49 AM 128/87  Assessment:   Blood Pressure needs further observation and needs improvement.  Dysuria  Plan:  Orders and follow up as documented in patient record. Reviewed medications and side effects in detail. The following changes are to be made: Lisinopril 10 mg 1 tablet daily added per Dr. Roselie Awkward. Follow up: 1 week for blood pressure check and 06/24/19 PP  No labs ordered per Dr. Roselie Awkward Advised patient to go to ED/Urgent care if dizziness, severe headache, SOB, chest pain, numbness/tingling of extremities, n/v. Treatment per orders.  Call or return to clinic prn if these symptoms worsen or fail to improve as  anticipated.    Derl Barrow, RN

## 2019-06-03 LAB — URINE CULTURE

## 2019-06-08 ENCOUNTER — Ambulatory Visit (INDEPENDENT_AMBULATORY_CARE_PROVIDER_SITE_OTHER): Payer: 59 | Admitting: *Deleted

## 2019-06-08 ENCOUNTER — Other Ambulatory Visit: Payer: Self-pay

## 2019-06-08 VITALS — BP 126/80 | HR 78 | Temp 97.9°F | Wt 158.8 lb

## 2019-06-08 DIAGNOSIS — Z0130 Encounter for examination of blood pressure without abnormal findings: Secondary | ICD-10-CM

## 2019-06-08 DIAGNOSIS — O165 Unspecified maternal hypertension, complicating the puerperium: Secondary | ICD-10-CM

## 2019-06-08 NOTE — Progress Notes (Signed)
   Subjective:  [name] [name] is a 39 y.o. female here for BP check.   Hypertension ROS: taking medications as instructed, medication side effects include: no side effects noted and hives on face,ears and neck (amlodipine), no TIA's, no chest pain on exertion, no dyspnea on exertion, no swelling of ankles, no orthostatic dizziness or lightheadedness, no orthopnea or paroxysmal nocturnal dyspnea, no palpitations and no intermittent claudication symptoms.    Objective:  BP 126/80 (BP Location: Right Arm, Patient Position: Sitting, Cuff Size: Normal)   Pulse 78   Temp 97.9 F (36.6 C) (Oral)   Wt 158 lb 12.8 oz (72 kg)   Breastfeeding Yes   BMI 30.00 kg/m   Appearance alert, well appearing, and in no distress, oriented to person, place, and time and normal appearing weight. General exam BP noted to be well controlled today in office.    Assessment:   Blood Pressure well controlled, stable, improved and asymptomatic.   Plan:  Current treatment plan is effective, no change in therapy.. Patient stopped taking the Amlodipine on 06/05/19 due to allergic reaction. Next appointment 06/24/19 for PP.  Derl Barrow, RN

## 2019-06-24 ENCOUNTER — Ambulatory Visit (INDEPENDENT_AMBULATORY_CARE_PROVIDER_SITE_OTHER): Payer: 59 | Admitting: Obstetrics and Gynecology

## 2019-06-24 ENCOUNTER — Other Ambulatory Visit: Payer: Self-pay

## 2019-06-24 ENCOUNTER — Encounter: Payer: Self-pay | Admitting: Obstetrics and Gynecology

## 2019-06-24 DIAGNOSIS — O165 Unspecified maternal hypertension, complicating the puerperium: Secondary | ICD-10-CM

## 2019-06-24 MED ORDER — AMLODIPINE BESYLATE 10 MG PO TABS: 10.0000 mg | ORAL_TABLET | Freq: Every day | ORAL | 1 refills | Status: DC

## 2019-06-24 NOTE — Progress Notes (Signed)
[place] Partum Visit Note  [name] [name] is a 39 y.o. [phone] female who presents for a postpartum visit. She is 5 weeks postpartum following a normal spontaneous vaginal delivery.  I have fully reviewed the prenatal and intrapartum course. The delivery was at 93 gestational weeks.  Anesthesia: none. Postpartum course has been unremarkable. She is "surprised that she has done so well on Zoloft and having the baby." Baby is doing well. Baby is feeding by breast. Bleeding no bleeding. Bowel function is normal. Bladder function is normal. Patient is not sexually active. Contraception method is none. Postpartum depression screening: negative score: 3  The following portions of the patient's history were reviewed and updated as appropriate: allergies, current medications, past family history, past medical history, past social history, past surgical history and problem list.  Review of Systems Constitutional: negative Eyes: negative Ears, nose, mouth, throat, and face: negative Respiratory: negative Cardiovascular: negative Gastrointestinal: negative Genitourinary:negative Integument/breast: negative Hematologic/lymphatic: negative Musculoskeletal:negative Neurological: negative Behavioral/Psych: negative except for anxiety and depression Endocrine: negative Allergic/Immunologic: negative    Objective:  Blood pressure 138/88, pulse 77, temperature 98.2 F (36.8 C), temperature source Oral, height 5\' 1"  (1.549 m), weight 158 lb (71.7 kg), currently breastfeeding.  General:  alert, cooperative and no distress   Breasts:  inspection negative, no nipple discharge or bleeding, no masses or nodularity palpable  Lungs: clear to auscultation bilaterally  Heart:  regular rate and rhythm, S1, S2 normal, no murmur, click, rub or gallop  Abdomen: soft, non-tender; bowel sounds normal; no masses,  no organomegaly   Vulva:  normal  Vagina: normal vagina  Cervix:  multiparous appearance  Corpus:  normal size, contour, position, consistency, mobility, non-tender  Adnexa:  normal adnexa  Rectal Exam: Not performed.        Assessment:    Normal postpartum exam. Pap smear not done at today's visit.   Plan:   Essential components of care per ACOG recommendations:  1.  Mood and well being: Patient with negative depression screening today. Reviewed local resources for support.  - Patient does not use tobacco. If using tobacco we discussed reduction and for recently cessation risk of relapse - hx of drug use? No   If yes, discussed support systems  2. Infant care and feeding:  -Patient currently breastmilk feeding? Yes If breastmilk feeding discussed return to work and pumping. If needed, patient was provided letter for work to allow for every 2-3 hr pumping breaks, and to be granted a private location to express breastmilk and refrigerated area to store breastmilk. Reviewed importance of draining breast regularly to support lactation. -Social determinants of health (SDOH) reviewed in EPIC. No concerns The following needs were identified none  3. Sexuality, contraception and birth spacing - Patient does not want a pregnancy in the next year.  Desired family size is 2 children.  - Reviewed forms of contraception in tiered fashion. Patient desired natural family planning (NFP) today.   - Discussed birth spacing of 18 months  4. Sleep and fatigue -Encouraged family/partner/community support of 4 hrs of uninterrupted sleep to help with mood and fatigue  5. Physical Recovery  - Discussed patients delivery and complications - Patient had a 1st degree laceration, perineal healing reviewed. Patient expressed understanding - Patient has urinary incontinence? No Patient was referred to pelvic floor PT  - Patient is safe to resume physical and sexual activity  6.  Health Maintenance - Last pap smear done 11/18/2018 and was normal with negative HPV.  No Mammogram  7. No Chronic Disease - PCP  follow up  [name] [name], [place] [facility] [place]

## 2019-06-29 ENCOUNTER — Other Ambulatory Visit: Payer: Self-pay | Admitting: Obstetrics and Gynecology

## 2019-06-29 DIAGNOSIS — O165 Unspecified maternal hypertension, complicating the puerperium: Secondary | ICD-10-CM

## 2019-06-29 NOTE — Progress Notes (Signed)
Erroneous entry

## 2019-08-16 ENCOUNTER — Other Ambulatory Visit: Payer: Self-pay | Admitting: Obstetrics and Gynecology

## 2019-08-16 DIAGNOSIS — O165 Unspecified maternal hypertension, complicating the puerperium: Secondary | ICD-10-CM

## 2020-05-16 ENCOUNTER — Telehealth: Payer: Self-pay | Admitting: Obstetrics and Gynecology

## 2020-05-16 NOTE — Telephone Encounter (Signed)
Attempted to reach patient about getting her scheduled for her annual exam. Left a voicemail message, and sent a message to her MyChart for her to call the office to schedule.

## 2020-06-08 ENCOUNTER — Inpatient Hospital Stay (HOSPITAL_COMMUNITY)
Admission: AD | Admit: 2020-06-08 | Discharge: 2020-06-09 | Disposition: A | Discharge status: Home / Self Care | Payer: 59 | Attending: Emergency Medicine | Admitting: Emergency Medicine

## 2020-06-08 ENCOUNTER — Other Ambulatory Visit: Payer: Self-pay

## 2020-06-08 ENCOUNTER — Encounter: Payer: Self-pay | Admitting: Certified Nurse Midwife

## 2020-06-08 ENCOUNTER — Ambulatory Visit (INDEPENDENT_AMBULATORY_CARE_PROVIDER_SITE_OTHER): Payer: 59 | Admitting: Certified Nurse Midwife

## 2020-06-08 ENCOUNTER — Encounter (HOSPITAL_COMMUNITY): Payer: Self-pay | Admitting: Obstetrics & Gynecology

## 2020-06-08 VITALS — BP 142/75 | HR 54 | Temp 97.8°F | Ht 60.5 in | Wt 159.0 lb

## 2020-06-08 DIAGNOSIS — R1031 Right lower quadrant pain: Secondary | ICD-10-CM

## 2020-06-08 DIAGNOSIS — Z01419 Encounter for gynecological examination (general) (routine) without abnormal findings: Secondary | ICD-10-CM | POA: Diagnosis not present

## 2020-06-08 DIAGNOSIS — R300 Dysuria: Secondary | ICD-10-CM

## 2020-06-08 DIAGNOSIS — I10 Essential (primary) hypertension: Secondary | ICD-10-CM | POA: Insufficient documentation

## 2020-06-08 DIAGNOSIS — N926 Irregular menstruation, unspecified: Secondary | ICD-10-CM | POA: Diagnosis not present

## 2020-06-08 DIAGNOSIS — R1011 Right upper quadrant pain: Secondary | ICD-10-CM | POA: Diagnosis present

## 2020-06-08 DIAGNOSIS — N2889 Other specified disorders of kidney and ureter: Secondary | ICD-10-CM | POA: Diagnosis not present

## 2020-06-08 LAB — COMPREHENSIVE METABOLIC PANEL
ALT: 15 U/L (ref 0–44)
AST: 22 U/L (ref 15–41)
Albumin: 4.2 g/dL (ref 3.5–5.0)
Alkaline Phosphatase: 56 U/L (ref 38–126)
Anion gap: 7 (ref 5–15)
BUN: 19 mg/dL (ref 6–20)
CO2: 26 mmol/L (ref 22–32)
Calcium: 9.5 mg/dL (ref 8.9–10.3)
Chloride: 102 mmol/L (ref 98–111)
Creatinine, Ser: 1.21 mg/dL — ABNORMAL HIGH (ref 0.44–1.00)
GFR, Estimated: 58 mL/min — ABNORMAL LOW (ref >60)
Glucose, Bld: 98 mg/dL (ref 70–99)
Potassium: 4.1 mmol/L (ref 3.5–5.1)
Sodium: 135 mmol/L (ref 135–145)
Total Bilirubin: 0.6 mg/dL (ref 0.3–1.2)
Total Protein: 7.4 g/dL (ref 6.5–8.1)

## 2020-06-08 LAB — URINALYSIS, ROUTINE W REFLEX MICROSCOPIC
Bilirubin Urine: NEGATIVE
Glucose, UA: NEGATIVE mg/dL
Ketones, ur: NEGATIVE mg/dL
Leukocytes,Ua: NEGATIVE
Nitrite: NEGATIVE
Protein, ur: NEGATIVE mg/dL
RBC / HPF: >50 RBC/hpf — ABNORMAL HIGH (ref 0–5)
Specific Gravity, Urine: 1.021 (ref 1.005–1.030)
pH: 7 (ref 5.0–8.0)

## 2020-06-08 LAB — POCT URINALYSIS DIPSTICK (MANUAL)
Leukocytes, UA: NEGATIVE
Nitrite, UA: NEGATIVE
Poct Bilirubin: NEGATIVE
Poct Blood: 250 — AB
Poct Glucose: NORMAL mg/dL
Poct Ketones: NEGATIVE
Poct Urobilinogen: NORMAL mg/dL
Spec Grav, UA: 1.02 (ref 1.010–1.025)
pH, UA: 7 (ref 5.0–8.0)

## 2020-06-08 LAB — CBC
HCT: 39.5 % (ref 36.0–46.0)
Hemoglobin: 13 g/dL (ref 12.0–15.0)
MCH: 30 pg (ref 26.0–34.0)
MCHC: 32.9 g/dL (ref 30.0–36.0)
MCV: 91.2 fL (ref 80.0–100.0)
Platelets: 265 10*3/uL (ref 150–400)
RBC: 4.33 MIL/uL (ref 3.87–5.11)
RDW: 12.6 % (ref 11.5–15.5)
WBC: 13.3 10*3/uL — ABNORMAL HIGH (ref 4.0–10.5)
nRBC: 0 % (ref 0.0–0.2)

## 2020-06-08 LAB — I-STAT BETA HCG BLOOD, ED (MC, WL, AP ONLY): I-stat hCG, quantitative: <5 m[IU]/mL (ref <5)

## 2020-06-08 LAB — LIPASE, BLOOD: Lipase: 38 U/L (ref 11–51)

## 2020-06-08 LAB — POCT URINE PREGNANCY: Preg Test, Ur: NEGATIVE

## 2020-06-08 MED ORDER — ONDANSETRON HCL 4 MG/2ML IJ SOLN
4.0000 mg | Freq: Once | INTRAMUSCULAR | Status: AC
  Administered 2020-06-09: 4 mg via INTRAVENOUS
  Filled 2020-06-08: qty 2

## 2020-06-08 MED ORDER — SODIUM CHLORIDE 0.9 % IV BOLUS
500.0000 mL | Freq: Once | INTRAVENOUS | Status: AC
  Administered 2020-06-09: 500 mL via INTRAVENOUS

## 2020-06-08 MED ORDER — MORPHINE SULFATE (PF) 4 MG/ML IV SOLN
4.0000 mg | Freq: Once | INTRAVENOUS | Status: AC
  Administered 2020-06-09: 4 mg via INTRAVENOUS
  Filled 2020-06-08: qty 1

## 2020-06-08 NOTE — ED Provider Notes (Signed)
Emergency Department Provider Note   I have reviewed the triage vital signs and the nursing notes.   HISTORY  Chief Complaint Abdominal Pain   HPI [name] [name] is a 40 y.o. female with past medical history reviewed below s/p uncomplicated vaginal delivery presents to the emergency department from the MAU for further evaluation of right lower quadrant pain.  Patient describes pain in the right lower quadrant starting yesterday but gradually worsening to the point of becoming severe today.  She denies fevers.  The pain is severe and worse with movement or touching in the right lower quadrant.  There is no radiation of symptoms.  She did have some vaginal bleeding with passage of clots and states that the first time she has had any of this since her vaginal delivery.  She did have issues with high blood pressure during pregnancy but when not pregnant does not require blood pressure medications.  She is not experiencing chest pain, shortness of breath, swelling in the legs. Pregnancy test negative in the MAU and labs from there show leukocytosis.    Past Medical History:  Diagnosis Date  . Anemia   . Depression   . Gestational hypertension 2017  . Headache    Migraines  . Kidney infection 2009    Patient Active Problem List   Diagnosis Date Noted  . Preeclampsia, severe, third trimester 05/19/2019  . Stress at work 04/29/2019  . Insomnia 04/29/2019  . Nightmares 04/29/2019  . Heartburn during pregnancy in third trimester 04/29/2019  . Dizziness 04/29/2019  . History of depression 04/29/2019  . Postpartum anxiety 04/29/2019  . Visual disturbances 04/29/2019  . Chronic migraine 03/26/2019  . Penicillin allergy 03/18/2019  . History of pre-eclampsia in prior pregnancy, currently pregnant 11/18/2018  . Uterine fibroid in pregnancy 10/21/2018  . Supervision of other normal pregnancy, antepartum 10/13/2018  . Fibroids 09/22/2015  . Migraine headache 05/24/2015    Past  Surgical History:  Procedure Laterality Date  . DILATION AND EVACUATION N/A 11/27/2017   Procedure: DILATATION AND EVACUATION;  Surgeon: Osborne Oman, MD;  Location: [facility];  Service: Gynecology;  Laterality: N/A;  . FEMUR FRACTURE SURGERY Left    metal rod  . LEG SURGERY Left 1999   steel rod in left femur bone     Allergies Lisinopril, Bactrim [sulfamethoxazole-trimethoprim], Dust mite extract, and Penicillins  Family History  Problem Relation Age of Onset  . Hyperlipidemia Mother   . Heart disease Maternal Aunt   . Heart disease Maternal Uncle   . Heart disease Paternal Aunt   . Heart disease Maternal Grandfather   . Heart disease Paternal Grandfather     Social History Social History   Tobacco Use  . Smoking status: Never Smoker  . Smokeless tobacco: Never Used  Vaping Use  . Vaping Use: Never used  Substance Use Topics  . Alcohol use: Not Currently    Comment: wine   . Drug use: No    Review of Systems  Constitutional: No fever/chills Eyes: No visual changes. ENT: No sore throat. Cardiovascular: Denies chest pain. Positive elevated BP.  Respiratory: Denies shortness of breath. Gastrointestinal: Positive RLQ abdominal pain.  No nausea, no vomiting.  No diarrhea.  No constipation. Genitourinary: Negative for dysuria. Musculoskeletal: Negative for back pain. Skin: Negative for rash. Neurological: Negative for headaches, focal weakness or numbness.  10-point ROS otherwise negative.  ____________________________________________   PHYSICAL EXAM:  VITAL SIGNS: ED Triage Vitals  Enc Vitals Group     BP  06/08/20 2007 (!) 165/95     Pulse Rate 06/08/20 2007 64     Resp 06/08/20 2007 (!) 24     Temp 06/08/20 2007 98.8 F (37.1 C)     Temp Source 06/08/20 2007 Oral     SpO2 06/08/20 2058 100 %     Weight 06/08/20 2007 161 lb 3.2 oz (73.1 kg)     Height 06/08/20 2007 5' 0.5" (1.537 m)   Constitutional: Alert and oriented. Well appearing and in no  acute distress. Eyes: Conjunctivae are normal.  Head: Atraumatic. Nose: No congestion/rhinnorhea. Mouth/Throat: Mucous membranes are moist.  Neck: No stridor.   Cardiovascular: Normal rate, regular rhythm. Good peripheral circulation. Grossly normal heart sounds.   Respiratory: Normal respiratory effort.  No retractions. Lungs CTAB. Gastrointestinal: Soft with focal RLQ tenderness with RLQ guarding. No rebound. No distention.  Musculoskeletal: No lower extremity tenderness nor edema. No gross deformities of extremities. Neurologic:  Normal speech and language. No gross focal neurologic deficits are appreciated.  Skin:  Skin is warm, dry and intact. No rash noted.  ____________________________________________   LABS (all labs ordered are listed, but only abnormal results are displayed)  Labs Reviewed  COMPREHENSIVE METABOLIC PANEL - Abnormal; Notable for the following components:      Result Value   Creatinine, Ser 1.21 (*)    GFR, Estimated 58 (*)    All other components within normal limits  CBC - Abnormal; Notable for the following components:   WBC 13.3 (*)    All other components within normal limits  URINALYSIS, ROUTINE W REFLEX MICROSCOPIC - Abnormal; Notable for the following components:   APPearance CLOUDY (*)    Hgb urine dipstick LARGE (*)    RBC / HPF >50 (*)    Bacteria, UA RARE (*)    All other components within normal limits  LIPASE, BLOOD  I-STAT BETA HCG BLOOD, ED ([facility], [facility], [facility] ONLY)   ____________________________________________  RADIOLOGY  CT ABDOMEN PELVIS W CONTRAST  Result Date: 06/09/2020 CLINICAL DATA:  40 year old female with right lower quadrant abdominal pain. EXAM: CT ABDOMEN AND PELVIS WITH CONTRAST TECHNIQUE: Multidetector CT imaging of the abdomen and pelvis was performed using the standard protocol following bolus administration of intravenous contrast. CONTRAST:  155mL OMNIPAQUE IOHEXOL 300 MG/ML  SOLN COMPARISON:  None. FINDINGS: Lower chest:  The visualized lung bases are clear. No intra-abdominal free air.  Small free fluid in the pelvis. Hepatobiliary: The liver is unremarkable. No intrahepatic biliary dilatation. The gallbladder is unremarkable. Pancreas: Unremarkable. No pancreatic ductal dilatation or surrounding inflammatory changes. Spleen: Normal in size without focal abnormality. Adrenals/Urinary Tract: The adrenal glands unremarkable. There is a solid mass centered in the inferior pole of the right kidney most consistent with malignancy. The mass abuts the lateral abdominal wall musculature. There is moderate right hydronephrosis as well as mild dilatation of the right ureter of indeterminate cause. Although this may be related to mass effect and compression of the proximal ureter by the right renal mass, a distal ureteral obstruction is not excluded but not evaluated on this CT. Direct visualization with scope may provide better evaluation. Several small stones noted in the left kidney measuring up to 3 mm. No obstructing calculus. There is no hydronephrosis on the left. The urinary bladder is grossly unremarkable. Stomach/Bowel: There is no bowel obstruction or active inflammation. There is moderate stool throughout the colon. The appendix is normal. There is abutment of the right renal mass to the wall of the mid ascending colon.  Vascular/Lymphatic: The abdominal aorta is unremarkable. There is mild compression of the IVC by dilated right renal pelvis. There is suboptimal evaluation of the right renal vein on this CT. No definite tumor identified in the visualized renal vein or IVC. No adenopathy. Reproductive: The uterus is retroverted. Probable small anterior uterine fibroid. The ovaries are grossly unremarkable. Other: There is diastasis of anterior abdominal wall musculature in the midline. Musculoskeletal: Left femoral intramedullary nail. No acute osseous pathology. IMPRESSION: 1. Solid mass centered in the inferior pole of the right  kidney most consistent with malignancy. Further characterization with renal mass protocol MRI on a nonemergent basis recommended. 2. Moderate right hydronephrosis which may be secondary to compression of the proximal right ureter by the renal mass. A distal ureteral or UVJ obstruction or mass is not excluded. Direct visualization may provide better evaluation. 3. Small nonobstructing left renal stones. No hydronephrosis on the left. 4. No bowel obstruction. Normal appendix. Electronically Signed   By: Anner Crete M.D.   On: 06/09/2020 01:18    ____________________________________________   PROCEDURES  Procedure(s) performed:   Procedures  None ____________________________________________   INITIAL IMPRESSION / ASSESSMENT AND PLAN / ED COURSE  Pertinent labs & imaging results that were available during my care of the patient were reviewed by me and considered in my medical decision making (see chart for details).   Patient presents to the emergency department for evaluation of right lower quadrant pain with leukocytosis transferred over from MAU.  She is 20 days status post vaginal delivery. No complications. BP normalized after delivery and is not on BP medication.  She has no swelling and is well perfused.  She is afebrile here.  She is significantly hypertensive which may be pain related.  Concern for acute appendicitis clinically.  Plan for CT and reassess.   CT reviewed and discussed with patient. Patient with a kidney mass and hydronephrosis. On re-assessment her pain is well controlled here. No UTI with hydro. Normal appendix. Needs non-emergent MRI per radiology. Discussed results in detail. Patient will need prompt Urology evaluation. I have placed a referral in our system to facilitate. Called in Rx for pain medication after review of [place] drug database. Patient to call for appointment in the AM. Discussed strict ED return precautions.   ____________________________________________  FINAL CLINICAL IMPRESSION(S) / ED DIAGNOSES  Final diagnoses:  RLQ abdominal pain  Renal mass, right     MEDICATIONS GIVEN DURING THIS VISIT:  Medications  morphine 4 MG/ML injection 4 mg (4 mg Intravenous Given 06/09/20 0035)  ondansetron (ZOFRAN) injection 4 mg (4 mg Intravenous Given 06/09/20 0035)  sodium chloride 0.9 % bolus 500 mL (0 mLs Intravenous Stopped 06/09/20 0224)  iohexol (OMNIPAQUE) 300 MG/ML solution 100 mL (100 mLs Intravenous Contrast Given 06/09/20 0103)     NEW OUTPATIENT MEDICATIONS STARTED DURING THIS VISIT:  Discharge Medication List as of 06/09/2020  2:13 AM    START taking these medications   Details  ondansetron (ZOFRAN ODT) 4 MG disintegrating tablet Take 1 tablet (4 mg total) by mouth every 8 (eight) hours as needed for nausea or vomiting., Starting Fri 06/09/2020, Normal    oxyCODONE-acetaminophen (PERCOCET/ROXICET) 5-325 MG tablet Take 1 tablet by mouth every 6 (six) hours as needed for severe pain., Starting Fri 06/09/2020, Normal    senna-docusate (SENOKOT-S) 8.6-50 MG tablet Take 1 tablet by mouth at bedtime as needed for mild constipation or moderate constipation., Starting Fri 06/09/2020, Normal        Note:  This  document was prepared using Systems analyst and may include unintentional dictation errors.  Nanda Quinton, MD, [facility]    Mailyn Steichen, [facility], MD 06/09/20 1640

## 2020-06-08 NOTE — Patient Instructions (Signed)
Blood Pressure Record Sheet To take your blood pressure, you will need a blood pressure machine. You can buy a blood pressure machine (blood pressure monitor) at your clinic, drug store, or online. When choosing one, consider:  An automatic monitor that has an arm cuff.  A cuff that wraps snugly around your upper arm. You should be able to fit only one finger between your arm and the cuff.  A device that stores blood pressure reading results.  Do not choose a monitor that measures your blood pressure from your wrist or finger. Follow your health care provider's instructions for how to take your blood pressure. To use this form:  Get one reading in the morning (a.m.) before you take any medicines.  Get one reading in the evening (p.m.) before supper.  Take at least 2 readings with each blood pressure check. This makes sure the results are correct. Wait 1-2 minutes between measurements.  Write down the results in the spaces on this form.  Repeat this once a week, or as told by your health care provider.  Make a follow-up appointment with your health care provider to discuss the results. Blood pressure log Date: _______________________  a.m. _____________________(1st reading) _____________________(2nd reading)  p.m. _____________________(1st reading) _____________________(2nd reading) Date: _______________________  a.m. _____________________(1st reading) _____________________(2nd reading)  p.m. _____________________(1st reading) _____________________(2nd reading) Date: _______________________  a.m. _____________________(1st reading) _____________________(2nd reading)  p.m. _____________________(1st reading) _____________________(2nd reading) Date: _______________________  a.m. _____________________(1st reading) _____________________(2nd reading)  p.m. _____________________(1st reading) _____________________(2nd reading) Date: _______________________  a.m.  _____________________(1st reading) _____________________(2nd reading)  p.m. _____________________(1st reading) _____________________(2nd reading) This information is not intended to replace advice given to you by your health care provider. Make sure you discuss any questions you have with your health care provider. Document Revised: 06/23/2019 Document Reviewed: 06/23/2019 Elsevier Patient Education  2021 [employer].

## 2020-06-08 NOTE — MAU Provider Note (Addendum)
S [name] [name] is a 40 y.o. [phone] non-pregnant female who presents to MAU today with complaint of severe RLQ pain that began yesterday evening. She was seen in the office today for her annual exam and told to take ibuprofen for the pain. She took 800mg  earlier this evening and the pain got worse, as did her BP. Not pregnant per UPT today and had vaginal bleeding after annual today. None now.    O BP (!) 165/95 (BP Location: Right Arm)   Pulse 64   Temp 98.8 F (37.1 C) (Oral)   Resp (!) 24   Ht 5' 0.5" (1.537 m)   Wt 161 lb 3.2 oz (73.1 kg)   LMP 04/01/2020   BMI 30.96 kg/m  Physical Exam Vitals and nursing note reviewed.  Constitutional:      General: She is in acute distress.     Appearance: She is diaphoretic. She is not ill-appearing or toxic-appearing.  HENT:     Head: Normocephalic and atraumatic.  Eyes:     Pupils: Pupils are equal, round, and reactive to light.  Cardiovascular:     Rate and Rhythm: Normal rate.  Pulmonary:     Effort: Pulmonary effort is normal.  Abdominal:     Tenderness: There is abdominal tenderness in the right lower quadrant.  Skin:    General: Skin is warm.     Capillary Refill: Capillary refill takes less than 2 seconds.  Neurological:     Mental Status: She is alert and oriented to person, place, and time.  Psychiatric:        Mood and Affect: Mood is anxious.        Behavior: Behavior normal.    A Right lower quadrant pain Medical screening exam complete  P Transfer patient to [facility] for evaluation of RLQ pain Report called to Dr. Loma Sousa, pt walked to ED.  Gabriel Carina, [place] 06/08/2020 8:44 PM   Attestation of Attending Supervision of Advanced Practice Provider (PA/CNM/NP): Evaluation and management procedures were performed by the Advanced Practice Provider under my supervision and collaboration.  I have reviewed the Advanced Practice Provider's note and chart, and I agree with the management and plan. I have also made any  necessary editorial changes.   Annalee Genta, DO Attending [place], [facility] for [facility], [facility] 06/09/2020 6:41 AM

## 2020-06-08 NOTE — MAU Note (Signed)
PT SAYS DEL VAG ON MARCH 4TH 2021- BREAST FEEDING .   SAYS PAIN IN RIGHT LOWER QUAD STARTED LAST NIGHT - CONSTANT   WENT TO DR OFFICE TODAY - UPT WAS NEG. LABS .  HAD  VAG BLEEDING IN TOILET THIS AM- NONE NOW.   TOOK 800MG   IBUPROFEN AT 7 - NO RELIEF.  LAST SEX- Tuesday  NAUSEATED

## 2020-06-08 NOTE — Progress Notes (Signed)
GYNECOLOGY ANNUAL PREVENTATIVE CARE ENCOUNTER NOTE  History:     Burundi Doverspike is a 40 y.o. [phone] female here for a routine annual gynecologic exam.  Current complaints: heavy vaginal bleeding with clots and abdominal pain, patient also complains of dysuria.   Denies abnormal discharge, problems with intercourse or other gynecologic concerns.    Gynecologic History No LMP recorded. (Menstrual status: Irregular Periods). Contraception: none Last Pap: 11/18/18. Results were: normal with negative HPV   Obstetric History OB History  Gravida Para Term Preterm AB Living  5 2 2   3 2   SAB IAB Ectopic Multiple Live Births  2     0 2    # Outcome Date GA Lbr Len/2nd Weight Sex Delivery Anes PTL Lv  5 Term 05/20/19 [redacted]w[redacted]d 01:13 / 00:05 6 lb 4.4 oz (2.846 kg) M Vag-Spont None  LIV  4 AB 11/27/17 [redacted]w[redacted]d    SAB        Birth Comments: D&E for MAB  3 Term 11/16/15 [redacted]w[redacted]d 02:27 / 00:26 6 lb 8.1 oz (2.95 kg) M Vag-Spont None  LIV  2 SAB           1 SAB             Past Medical History:  Diagnosis Date  . Anemia   . Depression   . Gestational hypertension 2017  . Headache    Migraines  . Kidney infection 2009    Past Surgical History:  Procedure Laterality Date  . DILATION AND EVACUATION N/A 11/27/2017   Procedure: DILATATION AND EVACUATION;  Surgeon: Osborne Oman, MD;  Location: [facility];  Service: Gynecology;  Laterality: N/A;  . FEMUR FRACTURE SURGERY Left    metal rod  . LEG SURGERY Left 1999   steel rod in left femur bone     Current Outpatient Medications on File Prior to Visit  Medication Sig Dispense Refill  . amLODipine (NORVASC) 10 MG tablet TAKE 1 TABLET(10 MG) BY MOUTH DAILY (Patient not taking: Reported on 06/08/2020) 30 tablet 1  . aspirin EC 81 MG tablet Take 1 tablet (81 mg total) by mouth daily. (Patient not taking: Reported on 06/08/2020) 30 tablet 11  . ibuprofen (ADVIL) 600 MG tablet Take 1 tablet (600 mg total) by mouth every 6 (six) hours. (Patient not  taking: Reported on 06/08/2020) 30 tablet 0  . NON FORMULARY Take 2 tablets by mouth daily. Blood builders (Patient not taking: Reported on 06/08/2020)    . Prenatal Vit-Fe Fumarate-FA (PRENATAL MULTIVITAMIN) TABS tablet Take 1 tablet by mouth daily at 12 noon. (Patient not taking: Reported on 06/08/2020)    . sertraline (ZOLOFT) 25 MG tablet Take 1 tablet (25 mg total) by mouth daily. (Patient not taking: Reported on 06/08/2020) 30 tablet 3   No current facility-administered medications on file prior to visit.    Allergies  Allergen Reactions  . Lisinopril Hives and Itching    Hives on face,ears and neck  . Bactrim [Sulfamethoxazole-Trimethoprim] Hives and Swelling  . Dust Mite Extract Hives  . Penicillins Other (See Comments)    Fever-pt was really sick. Has patient had a PCN reaction causing immediate rash, facial/tongue/throat swelling, SOB or lightheadedness with hypotension: No Has patient had a PCN reaction causing severe rash involving mucus membranes or skin necrosis: No Has patient had a PCN reaction that required hospitalization Yes Has patient had a PCN reaction occurring within the last 10 years: No If all of the above answers are "NO", then  may proceed with Cephalosporin use.     Social History:  reports that she has never smoked. She has never used smokeless tobacco. She reports previous alcohol use. She reports that she does not use drugs.  Family History  Problem Relation Age of Onset  . Hyperlipidemia Mother   . Heart disease Maternal Aunt   . Heart disease Maternal Uncle   . Heart disease Paternal Aunt   . Heart disease Maternal Grandfather   . Heart disease Paternal Grandfather     The following portions of the patient's history were reviewed and updated as appropriate: allergies, current medications, past family history, past medical history, past social history, past surgical history and problem list.  Review of Systems Pertinent items noted in HPI and  remainder of comprehensive ROS otherwise negative.  Physical Exam:  BP (!) 142/75 (BP Location: Right Arm, Patient Position: Sitting, Cuff Size: Normal)   Pulse (!) 54   Temp 97.8 F (36.6 C) (Oral)   Ht 5' 0.5" (1.537 m)   Wt 159 lb (72.1 kg)   Breastfeeding Yes   BMI 30.54 kg/m  CONSTITUTIONAL: Well-developed, well-nourished female in no acute distress.  HENT:  Normocephalic, atraumatic, External right and left ear normal.  EYES: Conjunctivae and EOM are normal. Pupils are equal, round, and reactive to light. No scleral icterus.  NECK: Normal range of motion, supple, no masses.  Normal thyroid.  SKIN: Skin is warm and dry. No rash noted. Not diaphoretic. No erythema. No pallor. MUSCULOSKELETAL: Normal range of motion. No tenderness.  No cyanosis, clubbing, or edema. NEUROLOGIC: Alert and oriented to person, place, and time. Normal reflexes, muscle tone coordination.  PSYCHIATRIC: Normal mood and affect. Normal behavior. Normal judgment and thought content. CARDIOVASCULAR: Normal heart rate noted, regular rhythm RESPIRATORY: Clear to auscultation bilaterally. Effort and breath sounds normal, no problems with respiration noted. ABDOMEN: Soft, no distention noted.  No tenderness, rebound or guarding.   Assessment and Plan:    1. Dysuria - POCT Urinalysis Dip Manual - Urine Culture  2. Menstrual problem - Pt reports amenorrhea since delivery of child 1 year ago, patient is currently breastfeeding  - Report having 1 day of spotting 2 months ago then yesterday started having heavy vaginal bleeding with clots and abdominal cramping  - UPT in office negative  - Educated and discussed with patient that this is most like resume of cycles, discussed with patient that first and second cycle can be heavy and more painful but then will normalize - Discussed with patient to follow up in the office if cycles continue to be abnormal  - POCT urine pregnancy - CBC - TSH - Beta hCG quant (ref  lab)  3. Women's annual routine gynecological examination - Pap smear not due at this time  - BP elevated today, patient reports that this is due to pain of cycle  - Educated and discussed HTN as silent killer, discussed with patient to monitor BP 3x weekly and to follow up next month in office for another BP check - if BP elevated at that time then will prescribe medication for management, patient verbalizes understanding  - Beta hCG quant (ref lab)  Will follow up results of labs and manage accordingly. Routine preventative health maintenance measures emphasized. Please refer to After Visit Summary for other counseling recommendations.      Lajean Manes, [place] for [employer], [place]

## 2020-06-08 NOTE — ED Notes (Signed)
Pt also reports passing blood clots yesterday which OB knows about.

## 2020-06-08 NOTE — ED Notes (Signed)
Brought to ED triage from OB per wheelchair. C/o right lower quadrant with nausea started last night.

## 2020-06-09 ENCOUNTER — Inpatient Hospital Stay (HOSPITAL_COMMUNITY): Payer: 59

## 2020-06-09 DIAGNOSIS — N2889 Other specified disorders of kidney and ureter: Secondary | ICD-10-CM | POA: Diagnosis not present

## 2020-06-09 LAB — CBC
Hematocrit: 38.4 % (ref 34.0–46.6)
Hemoglobin: 12.8 g/dL (ref 11.1–15.9)
MCH: 29.6 pg (ref 26.6–33.0)
MCHC: 33.3 g/dL (ref 31.5–35.7)
MCV: 89 fL (ref 79–97)
Platelets: 256 10*3/uL (ref 150–450)
RBC: 4.32 x10E6/uL (ref 3.77–5.28)
RDW: 12.6 % (ref 11.7–15.4)
WBC: 5.3 10*3/uL (ref 3.4–10.8)

## 2020-06-09 LAB — TSH: TSH: 0.665 u[IU]/mL (ref 0.450–4.500)

## 2020-06-09 LAB — BETA HCG QUANT (REF LAB): hCG Quant: <1 m[IU]/mL

## 2020-06-09 MED ORDER — ONDANSETRON 4 MG PO TBDP: 4.0000 mg | ORAL_TABLET | Freq: Three times a day (TID) | ORAL | 0 refills | Status: DC | PRN

## 2020-06-09 MED ORDER — IOHEXOL 300 MG/ML  SOLN
100.0000 mL | Freq: Once | INTRAMUSCULAR | Status: AC | PRN
  Administered 2020-06-09: 100 mL via INTRAVENOUS

## 2020-06-09 MED ORDER — SENNOSIDES-DOCUSATE SODIUM 8.6-50 MG PO TABS: 1.0000 | ORAL_TABLET | Freq: Every evening | ORAL | 0 refills | Status: DC | PRN

## 2020-06-09 MED ORDER — OXYCODONE-ACETAMINOPHEN 5-325 MG PO TABS: 1.0000 | ORAL_TABLET | Freq: Four times a day (QID) | ORAL | 0 refills | Status: DC | PRN

## 2020-06-09 NOTE — ED Notes (Signed)
Patient given discharge paperwork and instructions. Verbalized understanding of teaching. IV d/c with cath tip intact. Ambulatory to exit in NAD with steady gait. 

## 2020-06-09 NOTE — Discharge Instructions (Addendum)
You were seen in the emergency department today with right-sided abdominal pain.  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  This is causing some narrowing of the tube which drained urine from the kidney which is likely causing your pain.  I am referring you to a urologist.  I have placed a referral in our system but please call the office first thing tomorrow morning.  I have also called in some stronger pain medication.  If you will be taking this in breast-feeding you will need to pump and dump after taking this medication as it can be passed to your baby.   If you develop severe pain not controlled by your medications, you notice that you stop urinating, you develop fevers/chills you should return to the emergency department immediately for reevaluation.

## 2020-06-11 LAB — URINE CULTURE

## 2020-06-20 ENCOUNTER — Other Ambulatory Visit: Payer: Self-pay | Admitting: Urology

## 2020-06-20 ENCOUNTER — Other Ambulatory Visit (HOSPITAL_COMMUNITY): Payer: Self-pay | Admitting: Urology

## 2020-06-20 DIAGNOSIS — D49511 Neoplasm of unspecified behavior of right kidney: Secondary | ICD-10-CM

## 2020-06-22 ENCOUNTER — Other Ambulatory Visit: Payer: Self-pay | Admitting: Urology

## 2020-06-28 ENCOUNTER — Ambulatory Visit (HOSPITAL_COMMUNITY)
Admission: RE | Admit: 2020-06-28 | Discharge: 2020-06-28 | Disposition: A | Discharge status: Home / Self Care | Payer: 59 | Source: Ambulatory Visit | Attending: Urology | Admitting: Urology

## 2020-06-28 ENCOUNTER — Other Ambulatory Visit: Payer: Self-pay

## 2020-06-28 DIAGNOSIS — D49511 Neoplasm of unspecified behavior of right kidney: Secondary | ICD-10-CM

## 2020-06-28 MED ORDER — GADOBUTROL 1 MMOL/ML IV SOLN
7.0000 mL | Freq: Once | INTRAVENOUS | Status: AC | PRN
  Administered 2020-06-28: 7 mL via INTRAVENOUS

## 2020-07-03 ENCOUNTER — Other Ambulatory Visit (HOSPITAL_COMMUNITY): Payer: Self-pay

## 2020-07-04 ENCOUNTER — Other Ambulatory Visit (HOSPITAL_COMMUNITY): Payer: Self-pay

## 2020-07-05 ENCOUNTER — Ambulatory Visit: Payer: 59 | Admitting: Student

## 2020-07-10 NOTE — Patient Instructions (Addendum)
DUE TO COVID-19 ONLY ONE VISITOR IS ALLOWED TO COME WITH YOU AND STAY IN THE WAITING ROOM ONLY DURING PRE OP AND PROCEDURE.   **NO VISITORS ARE ALLOWED IN THE SHORT STAY AREA OR RECOVERY ROOM!!**  IF YOU WILL BE ADMITTED INTO THE HOSPITAL YOU ARE ALLOWED ONLY TWO SUPPORT PEOPLE DURING VISITATION HOURS ONLY (10AM -8PM)   . The support person(s) may change daily. . The support person(s) must pass our screening, gel in and out, and wear a mask at all times, including in the patient's room. . Patients must also wear a mask when staff or their support person are in the room.  No visitors under the age of 101. Any visitor under the age of 78 must be accompanied by an adult.    COVID SWAB TESTING MUST BE COMPLETED ON:  Friday, 07-19-20 @ 10:35 AM   [address] [place],  [address]  (Must self quarantine after testing. Follow instructions on handout.)        Your procedure is scheduled on:  Friday, 07-21-20   Report to [facility] Main  Entrance    Report to admitting at 10:10 AM   Call this number if you have problems the morning of surgery [phone]   Do not eat food :After Midnight.   May have liquids until 9:00 AM day of surgery  CLEAR LIQUID DIET  Foods Allowed                                                                     Foods Excluded  Water, Black Coffee and tea, regular and decaf             liquids that you cannot  Plain Jell-O in any flavor  (No red)                                    see through such as: Fruit ices (not with fruit pulp)                                      milk, soups, orange juice              Iced Popsicles (No red)                                      All solid food                                   Apple juices Sports drinks like Gatorade (No red) Lightly seasoned clear broth or consume(fat free) Sugar, honey syrup    Oral Hygiene is also important to reduce your risk of infection.                                    Remember -  BRUSH YOUR TEETH THE MORNING OF SURGERY WITH YOUR  REGULAR TOOTHPASTE   Do NOT smoke after Midnight   Take these medicines the morning of surgery with A SIP OF WATER:  Amlodipine, Cetirizine, Sertraline, Oxycodone                               You may not have any metal on your body including hair pins, jewelry, and body piercings             Do not wear make-up, lotions, powders, perfumes/cologne, or deodorant             Do not wear nail polish.  Do not shave  48 hours prior to surgery.     Do not bring valuables to the hospital. [place].   Contacts, dentures or bridgework may not be worn into surgery.   Bring small overnight bag day of surgery.               Please read over the following fact sheets you were given: IF YOU HAVE QUESTIONS ABOUT YOUR PRE OP INSTRUCTIONS PLEASE CALL  [facility] - Preparing for Surgery Before surgery, you can play an important role.  Because skin is not sterile, your skin needs to be as free of germs as possible.  You can reduce the number of germs on your skin by washing with CHG (chlorahexidine gluconate) soap before surgery.  CHG is an antiseptic cleaner which kills germs and bonds with the skin to continue killing germs even after washing. Please DO NOT use if you have an allergy to CHG or antibacterial soaps.  If your skin becomes reddened/irritated stop using the CHG and inform your nurse when you arrive at Short Stay. Do not shave (including legs and underarms) for at least 48 hours prior to the first CHG shower.  You may shave your face/neck.  Please follow these instructions carefully:  1.  Shower with CHG Soap the night before surgery and the  morning of surgery.  2.  If you choose to wash your hair, wash your hair first as usual with your normal  shampoo.  3.  After you shampoo, rinse your hair and body thoroughly to remove the shampoo.                             4.  Use CHG as you  would any other liquid soap.  You can apply chg directly to the skin and wash.  Gently with a scrungie or clean washcloth.  5.  Apply the CHG Soap to your body ONLY FROM THE NECK DOWN.   Do   not use on face/ open                           Wound or open sores. Avoid contact with eyes, ears mouth and   genitals (private parts).                       Wash face,  Genitals (private parts) with your normal soap.             6.  Wash thoroughly, paying special attention to the area where your    surgery  will be performed.  7.  Thoroughly rinse your body with warm water from the neck down.  8.  DO NOT shower/wash with your normal soap after using and rinsing off the CHG Soap.                9.  Pat yourself dry with a clean towel.            10.  Wear clean pajamas.            11.  Place clean sheets on your bed the night of your first shower and do not  sleep with pets. Day of Surgery : Do not apply any lotions/deodorants the morning of surgery.  Please wear clean clothes to the hospital/surgery center.  FAILURE TO FOLLOW THESE INSTRUCTIONS MAY RESULT IN THE CANCELLATION OF YOUR SURGERY  PATIENT SIGNATURE_________________________________  NURSE SIGNATURE__________________________________  ________________________________________________________________________   Adam Phenix  An incentive spirometer is a tool that can help keep your lungs clear and active. This tool measures how well you are filling your lungs with each breath. Taking long deep breaths may help reverse or decrease the chance of developing breathing (pulmonary) problems (especially infection) following:  A long period of time when you are unable to move or be active. BEFORE THE PROCEDURE   If the spirometer includes an indicator to show your best effort, your nurse or respiratory therapist will set it to a desired goal.  If possible, sit up straight or lean slightly forward. Try not to slouch.  Hold the incentive  spirometer in an upright position. INSTRUCTIONS FOR USE  1. Sit on the edge of your bed if possible, or sit up as far as you can in bed or on a chair. 2. Hold the incentive spirometer in an upright position. 3. Breathe out normally. 4. Place the mouthpiece in your mouth and seal your lips tightly around it. 5. Breathe in slowly and as deeply as possible, raising the piston or the ball toward the top of the column. 6. Hold your breath for 3-5 seconds or for as long as possible. Allow the piston or ball to fall to the bottom of the column. 7. Remove the mouthpiece from your mouth and breathe out normally. 8. Rest for a few seconds and repeat Steps 1 through 7 at least 10 times every 1-2 hours when you are awake. Take your time and take a few normal breaths between deep breaths. 9. The spirometer may include an indicator to show your best effort. Use the indicator as a goal to work toward during each repetition. 10. After each set of 10 deep breaths, practice coughing to be sure your lungs are clear. If you have an incision (the cut made at the time of surgery), support your incision when coughing by placing a pillow or rolled up towels firmly against it. Once you are able to get out of bed, walk around indoors and cough well. You may stop using the incentive spirometer when instructed by your caregiver.  RISKS AND COMPLICATIONS  Take your time so you do not get dizzy or light-headed.  If you are in pain, you may need to take or ask for pain medication before doing incentive spirometry. It is harder to take a deep breath if you are having pain. AFTER USE  Rest and breathe slowly and easily.  It can be helpful to keep track of a log of your progress. Your caregiver can provide you with a simple table to help with this. If you are using the spirometer at home, follow these instructions: [place] IF:   You are having  difficultly using the spirometer.  You have trouble using the  spirometer as often as instructed.  Your pain medication is not giving enough relief while using the spirometer.  You develop fever of 100.5 F (38.1 C) or higher. SEEK IMMEDIATE MEDICAL CARE IF:   You cough up bloody sputum that had not been present before.  You develop fever of 102 F (38.9 C) or greater.  You develop worsening pain at or near the incision site. MAKE SURE YOU:   Understand these instructions.  Will watch your condition.  Will get help right away if you are not doing well or get worse. Document Released: 07/15/2006 Document Revised: 05/27/2011 Document Reviewed: 09/15/2006 ExitCare Patient Information 2014 ExitCare, [place].   ________________________________________________________________________  WHAT IS A BLOOD TRANSFUSION? Blood Transfusion Information  A transfusion is the replacement of blood or some of its parts. Blood is made up of multiple cells which provide different functions.  Red blood cells carry oxygen and are used for blood loss replacement.  White blood cells fight against infection.  Platelets control bleeding.  Plasma helps clot blood.  Other blood products are available for specialized needs, such as hemophilia or other clotting disorders. BEFORE THE TRANSFUSION  Who gives blood for transfusions?   Healthy volunteers who are fully evaluated to make sure their blood is safe. This is blood bank blood. Transfusion therapy is the safest it has ever been in the practice of medicine. Before blood is taken from a donor, a complete history is taken to make sure that person has no history of diseases nor engages in risky social behavior (examples are intravenous drug use or sexual activity with multiple partners). The donor's travel history is screened to minimize risk of transmitting infections, such as malaria. The donated blood is tested for signs of infectious diseases, such as HIV and hepatitis. The blood is then tested to be sure it is  compatible with you in order to minimize the chance of a transfusion reaction. If you or a relative donates blood, this is often done in anticipation of surgery and is not appropriate for emergency situations. It takes many days to process the donated blood. RISKS AND COMPLICATIONS Although transfusion therapy is very safe and saves many lives, the main dangers of transfusion include:   Getting an infectious disease.  Developing a transfusion reaction. This is an allergic reaction to something in the blood you were given. Every precaution is taken to prevent this. The decision to have a blood transfusion has been considered carefully by your caregiver before blood is given. Blood is not given unless the benefits outweigh the risks. AFTER THE TRANSFUSION  Right after receiving a blood transfusion, you will usually feel much better and more energetic. This is especially true if your red blood cells have gotten low (anemic). The transfusion raises the level of the red blood cells which carry oxygen, and this usually causes an energy increase.  The nurse administering the transfusion will monitor you carefully for complications. HOME CARE INSTRUCTIONS  No special instructions are needed after a transfusion. You may find your energy is better. Speak with your caregiver about any limitations on activity for underlying diseases you may have. SEEK MEDICAL CARE IF:   Your condition is not improving after your transfusion.  You develop redness or irritation at the intravenous (IV) site. SEEK IMMEDIATE MEDICAL CARE IF:  Any of the following symptoms occur over the next 12 hours:  Shaking chills.  You have a temperature by mouth above  102 F (38.9 C), not controlled by medicine.  Chest, back, or muscle pain.  People around you feel you are not acting correctly or are confused.  Shortness of breath or difficulty breathing.  Dizziness and fainting.  You get a rash or develop hives.  You have  a decrease in urine output.  Your urine turns a dark color or changes to pink, red, or brown. Any of the following symptoms occur over the next 10 days:  You have a temperature by mouth above 102 F (38.9 C), not controlled by medicine.  Shortness of breath.  Weakness after normal activity.  The white part of the eye turns yellow (jaundice).  You have a decrease in the amount of urine or are urinating less often.  Your urine turns a dark color or changes to pink, red, or brown. Document Released: 03/01/2000 Document Revised: 05/27/2011 Document Reviewed: 10/19/2007 [facility] Patient Information 2014 [place], [place].  _______________________________________________________________________

## 2020-07-10 NOTE — Progress Notes (Addendum)
COVID Vaccine Completed: x2 Date COVID Vaccine completed:  03-23-20 05-06-20 Has received booster: No COVID vaccine manufacturer: Pfizer      Date of COVID positive in last 90 days:  N/A  PCP - Hermine Messick, MD Cardiologist - N/A  Chest x-ray - 06-28-20 Epic EKG - 07-12-20 Epic  Stress Test - N/A ECHO - N/A Cardiac Cath - N/A Pacemaker/ICD device last checked: Spinal Cord Stimulator:  Sleep Study - N/A CPAP -   Fasting Blood Sugar - N/A Checks Blood Sugar _____ times a day  Blood Thinner Instructions: N/A Aspirin Instructions: Last Dose:  Activity level:  Can go up a flight of stairs and perform activities of daily living without stopping and without symptoms of chest pain or shortness of breath.  Able to exercise without symptoms    Anesthesia review:  N/A  Patient denies shortness of breath, fever, cough and chest pain at PAT appointment   Patient verbalized understanding of instructions that were given to them at the PAT appointment. Patient was also instructed that they will need to review over the PAT instructions again at home before surgery.

## 2020-07-12 ENCOUNTER — Other Ambulatory Visit: Payer: Self-pay

## 2020-07-12 ENCOUNTER — Encounter (HOSPITAL_COMMUNITY): Payer: Self-pay

## 2020-07-12 ENCOUNTER — Encounter (HOSPITAL_COMMUNITY)
Admission: RE | Admit: 2020-07-12 | Discharge: 2020-07-12 | Disposition: A | Discharge status: Home / Self Care | Payer: 59 | Source: Ambulatory Visit | Attending: Urology | Admitting: Urology

## 2020-07-12 DIAGNOSIS — Z01818 Encounter for other preprocedural examination: Secondary | ICD-10-CM | POA: Diagnosis present

## 2020-07-12 HISTORY — DX: Personal history of urinary calculi: Z87.442

## 2020-07-12 HISTORY — DX: Anxiety disorder, unspecified: F41.9

## 2020-07-12 HISTORY — DX: Attention-deficit hyperactivity disorder, unspecified type: F90.9

## 2020-07-12 HISTORY — DX: Other specified disorders of kidney and ureter: N28.89

## 2020-07-12 LAB — COMPREHENSIVE METABOLIC PANEL
ALT: 16 U/L (ref 0–44)
AST: 27 U/L (ref 15–41)
Albumin: 4.3 g/dL (ref 3.5–5.0)
Alkaline Phosphatase: 58 U/L (ref 38–126)
Anion gap: 10 (ref 5–15)
BUN: 13 mg/dL (ref 6–20)
CO2: 23 mmol/L (ref 22–32)
Calcium: 9.5 mg/dL (ref 8.9–10.3)
Chloride: 105 mmol/L (ref 98–111)
Creatinine, Ser: 0.46 mg/dL (ref 0.44–1.00)
GFR, Estimated: >60 mL/min (ref >60)
Glucose, Bld: 81 mg/dL (ref 70–99)
Potassium: 4.3 mmol/L (ref 3.5–5.1)
Sodium: 138 mmol/L (ref 135–145)
Total Bilirubin: 0.1 mg/dL — ABNORMAL LOW (ref 0.3–1.2)
Total Protein: 7.4 g/dL (ref 6.5–8.1)

## 2020-07-12 LAB — CBC
HCT: 37.8 % (ref 36.0–46.0)
Hemoglobin: 12.3 g/dL (ref 12.0–15.0)
MCH: 29.6 pg (ref 26.0–34.0)
MCHC: 32.5 g/dL (ref 30.0–36.0)
MCV: 91.1 fL (ref 80.0–100.0)
Platelets: 281 10*3/uL (ref 150–400)
RBC: 4.15 MIL/uL (ref 3.87–5.11)
RDW: 12.9 % (ref 11.5–15.5)
WBC: 4.9 10*3/uL (ref 4.0–10.5)
nRBC: 0 % (ref 0.0–0.2)

## 2020-07-19 ENCOUNTER — Other Ambulatory Visit (HOSPITAL_COMMUNITY)
Admission: RE | Admit: 2020-07-19 | Discharge: 2020-07-19 | Disposition: A | Discharge status: Home / Self Care | Payer: 59 | Source: Ambulatory Visit | Attending: Urology | Admitting: Urology

## 2020-07-19 DIAGNOSIS — Z01812 Encounter for preprocedural laboratory examination: Secondary | ICD-10-CM | POA: Insufficient documentation

## 2020-07-19 DIAGNOSIS — Z20822 Contact with and (suspected) exposure to covid-19: Secondary | ICD-10-CM | POA: Insufficient documentation

## 2020-07-19 LAB — SARS CORONAVIRUS 2 (TAT 6-24 HRS): SARS Coronavirus 2: NEGATIVE

## 2020-07-21 ENCOUNTER — Other Ambulatory Visit: Payer: Self-pay

## 2020-07-21 ENCOUNTER — Inpatient Hospital Stay (HOSPITAL_COMMUNITY): Payer: 59 | Admitting: Certified Registered Nurse Anesthetist

## 2020-07-21 ENCOUNTER — Other Ambulatory Visit (HOSPITAL_COMMUNITY): Payer: Self-pay

## 2020-07-21 ENCOUNTER — Encounter (HOSPITAL_COMMUNITY)
Admission: RE | Disposition: A | Discharge status: Home / Self Care | Payer: Self-pay | Source: Other Acute Inpatient Hospital | Attending: Urology

## 2020-07-21 ENCOUNTER — Inpatient Hospital Stay (HOSPITAL_COMMUNITY)
Admission: RE | Admit: 2020-07-21 | Discharge: 2020-07-22 | DRG: 658 | Disposition: A | Discharge status: Home / Self Care | Payer: 59 | Attending: Urology | Admitting: Urology

## 2020-07-21 ENCOUNTER — Encounter (HOSPITAL_COMMUNITY): Payer: Self-pay | Admitting: Urology

## 2020-07-21 DIAGNOSIS — Z880 Allergy status to penicillin: Secondary | ICD-10-CM | POA: Diagnosis not present

## 2020-07-21 DIAGNOSIS — Z8249 Family history of ischemic heart disease and other diseases of the circulatory system: Secondary | ICD-10-CM

## 2020-07-21 DIAGNOSIS — I10 Essential (primary) hypertension: Secondary | ICD-10-CM | POA: Diagnosis present

## 2020-07-21 DIAGNOSIS — Z85528 Personal history of other malignant neoplasm of kidney: Secondary | ICD-10-CM | POA: Insufficient documentation

## 2020-07-21 DIAGNOSIS — Z87442 Personal history of urinary calculi: Secondary | ICD-10-CM | POA: Diagnosis not present

## 2020-07-21 DIAGNOSIS — N2889 Other specified disorders of kidney and ureter: Secondary | ICD-10-CM | POA: Diagnosis present

## 2020-07-21 DIAGNOSIS — Z888 Allergy status to other drugs, medicaments and biological substances status: Secondary | ICD-10-CM

## 2020-07-21 DIAGNOSIS — Z83438 Family history of other disorder of lipoprotein metabolism and other lipidemia: Secondary | ICD-10-CM | POA: Diagnosis not present

## 2020-07-21 DIAGNOSIS — C641 Malignant neoplasm of right kidney, except renal pelvis: Secondary | ICD-10-CM | POA: Diagnosis present

## 2020-07-21 DIAGNOSIS — N200 Calculus of kidney: Secondary | ICD-10-CM | POA: Diagnosis present

## 2020-07-21 DIAGNOSIS — Z882 Allergy status to sulfonamides status: Secondary | ICD-10-CM | POA: Diagnosis not present

## 2020-07-21 DIAGNOSIS — R110 Nausea: Secondary | ICD-10-CM | POA: Diagnosis not present

## 2020-07-21 DIAGNOSIS — Z20822 Contact with and (suspected) exposure to covid-19: Secondary | ICD-10-CM | POA: Diagnosis present

## 2020-07-21 DIAGNOSIS — F419 Anxiety disorder, unspecified: Secondary | ICD-10-CM | POA: Diagnosis present

## 2020-07-21 HISTORY — PX: ROBOT ASSISTED LAPAROSCOPIC NEPHRECTOMY: SHX5140

## 2020-07-21 LAB — TYPE AND SCREEN
ABO/RH(D): B POS
Antibody Screen: NEGATIVE

## 2020-07-21 LAB — HEMOGLOBIN AND HEMATOCRIT, BLOOD
HCT: 41.3 % (ref 36.0–46.0)
Hemoglobin: 13.2 g/dL (ref 12.0–15.0)

## 2020-07-21 LAB — PREGNANCY, URINE: Preg Test, Ur: NEGATIVE

## 2020-07-21 SURGERY — NEPHRECTOMY, RADICAL, ROBOT-ASSISTED, LAPAROSCOPIC, ADULT
Anesthesia: General | Laterality: Right

## 2020-07-21 SURGERY — CYSTOSCOPY
Anesthesia: General | Laterality: Left

## 2020-07-21 MED ORDER — DOCUSATE SODIUM 100 MG PO CAPS
100.0000 mg | ORAL_CAPSULE | Freq: Two times a day (BID) | ORAL | Status: DC
  Administered 2020-07-21 – 2020-07-22 (×2): 100 mg via ORAL
  Filled 2020-07-21 (×2): qty 1

## 2020-07-21 MED ORDER — CLINDAMYCIN PHOSPHATE 900 MG/50ML IV SOLN
900.0000 mg | Freq: Once | INTRAVENOUS | Status: AC
  Administered 2020-07-21: 900 mg via INTRAVENOUS
  Filled 2020-07-21: qty 50

## 2020-07-21 MED ORDER — MIDAZOLAM HCL 2 MG/2ML IJ SOLN
INTRAMUSCULAR | Status: AC
  Filled 2020-07-21: qty 2

## 2020-07-21 MED ORDER — FENTANYL CITRATE (PF) 250 MCG/5ML IJ SOLN
INTRAMUSCULAR | Status: AC
  Filled 2020-07-21: qty 5

## 2020-07-21 MED ORDER — SUGAMMADEX SODIUM 200 MG/2ML IV SOLN
INTRAVENOUS | Status: DC | PRN
  Administered 2020-07-21: 200 mg via INTRAVENOUS

## 2020-07-21 MED ORDER — LIDOCAINE 2% (20 MG/ML) 5 ML SYRINGE
INTRAMUSCULAR | Status: AC
  Filled 2020-07-21: qty 5

## 2020-07-21 MED ORDER — PROPOFOL 10 MG/ML IV BOLUS
INTRAVENOUS | Status: AC
  Filled 2020-07-21: qty 20

## 2020-07-21 MED ORDER — CHLORHEXIDINE GLUCONATE 0.12 % MT SOLN
15.0000 mL | Freq: Once | OROMUCOSAL | Status: AC
  Administered 2020-07-21: 15 mL via OROMUCOSAL

## 2020-07-21 MED ORDER — ONDANSETRON HCL 4 MG/2ML IJ SOLN: 4.0000 mg | Freq: Once | INTRAMUSCULAR | Status: DC | PRN

## 2020-07-21 MED ORDER — ONDANSETRON HCL 4 MG/2ML IJ SOLN
4.0000 mg | INTRAMUSCULAR | Status: DC | PRN
  Administered 2020-07-22: 4 mg via INTRAVENOUS
  Filled 2020-07-21: qty 2

## 2020-07-21 MED ORDER — HYDROMORPHONE HCL 1 MG/ML IJ SOLN
0.5000 mg | INTRAMUSCULAR | Status: DC | PRN
  Administered 2020-07-21: 1 mg via INTRAVENOUS
  Administered 2020-07-22: 0.5 mg via INTRAVENOUS
  Filled 2020-07-21 (×2): qty 1

## 2020-07-21 MED ORDER — BUPIVACAINE LIPOSOME 1.3 % IJ SUSP
20.0000 mL | Freq: Once | INTRAMUSCULAR | Status: AC
  Administered 2020-07-21: 20 mL
  Filled 2020-07-21: qty 20

## 2020-07-21 MED ORDER — LACTATED RINGERS IR SOLN
Status: DC | PRN
Start: 2020-07-21 — End: 2020-07-21
  Administered 2020-07-21: 1000 mL

## 2020-07-21 MED ORDER — MIDAZOLAM HCL 2 MG/2ML IJ SOLN
INTRAMUSCULAR | Status: DC | PRN
  Administered 2020-07-21: 2 mg via INTRAVENOUS

## 2020-07-21 MED ORDER — DROPERIDOL 2.5 MG/ML IJ SOLN
INTRAMUSCULAR | Status: DC | PRN
  Administered 2020-07-21: .625 mg via INTRAVENOUS

## 2020-07-21 MED ORDER — ACETAMINOPHEN 10 MG/ML IV SOLN
1000.0000 mg | Freq: Once | INTRAVENOUS | Status: DC | PRN
  Administered 2020-07-21: 1000 mg via INTRAVENOUS

## 2020-07-21 MED ORDER — LIDOCAINE 2% (20 MG/ML) 5 ML SYRINGE
INTRAMUSCULAR | Status: DC | PRN
  Administered 2020-07-21: 1.5 mg/kg/h via INTRAVENOUS

## 2020-07-21 MED ORDER — ACETAMINOPHEN 500 MG PO TABS
1000.0000 mg | ORAL_TABLET | Freq: Four times a day (QID) | ORAL | Status: AC
  Administered 2020-07-21 – 2020-07-22 (×4): 1000 mg via ORAL
  Filled 2020-07-21 (×4): qty 2

## 2020-07-21 MED ORDER — LACTATED RINGERS IV SOLN: INTRAVENOUS | Status: DC

## 2020-07-21 MED ORDER — LIDOCAINE HCL (CARDIAC) PF 100 MG/5ML IV SOSY
PREFILLED_SYRINGE | INTRAVENOUS | Status: DC | PRN
  Administered 2020-07-21: 100 mg via INTRAVENOUS

## 2020-07-21 MED ORDER — SERTRALINE HCL 25 MG PO TABS
25.0000 mg | ORAL_TABLET | Freq: Every day | ORAL | Status: DC
Start: 2020-07-22 — End: 2020-07-22

## 2020-07-21 MED ORDER — OXYCODONE HCL 5 MG PO TABS
5.0000 mg | ORAL_TABLET | Freq: Once | ORAL | Status: DC | PRN
Start: 2020-07-21 — End: 2020-07-21

## 2020-07-21 MED ORDER — SODIUM CHLORIDE (PF) 0.9 % IJ SOLN
INTRAMUSCULAR | Status: DC | PRN
  Administered 2020-07-21: 20 mL

## 2020-07-21 MED ORDER — LIDOCAINE HCL 2 % IJ SOLN
INTRAMUSCULAR | Status: AC
  Filled 2020-07-21: qty 20

## 2020-07-21 MED ORDER — HYDROMORPHONE HCL 1 MG/ML IJ SOLN
0.2500 mg | INTRAMUSCULAR | Status: DC | PRN
  Administered 2020-07-21: 0.5 mg via INTRAVENOUS

## 2020-07-21 MED ORDER — SODIUM CHLORIDE (PF) 0.9 % IJ SOLN
INTRAMUSCULAR | Status: AC
  Filled 2020-07-21: qty 20

## 2020-07-21 MED ORDER — ROCURONIUM BROMIDE 10 MG/ML (PF) SYRINGE
PREFILLED_SYRINGE | INTRAVENOUS | Status: AC
  Filled 2020-07-21: qty 10

## 2020-07-21 MED ORDER — ORAL CARE MOUTH RINSE: 15.0000 mL | Freq: Once | OROMUCOSAL | Status: AC

## 2020-07-21 MED ORDER — OXYCODONE-ACETAMINOPHEN 5-325 MG PO TABS
1.0000 | ORAL_TABLET | Freq: Four times a day (QID) | ORAL | 0 refills | Status: DC | PRN
  Filled 2020-07-21: qty 20, 3d supply, fill #0

## 2020-07-21 MED ORDER — DEXTROSE-NACL 5-0.45 % IV SOLN: INTRAVENOUS | Status: DC

## 2020-07-21 MED ORDER — DEXAMETHASONE SODIUM PHOSPHATE 10 MG/ML IJ SOLN
INTRAMUSCULAR | Status: DC | PRN
  Administered 2020-07-21: 10 mg via INTRAVENOUS

## 2020-07-21 MED ORDER — BELLADONNA ALKALOIDS-OPIUM 16.2-60 MG RE SUPP
1.0000 | Freq: Four times a day (QID) | RECTAL | Status: DC | PRN
  Administered 2020-07-21: 1 via RECTAL
  Filled 2020-07-21: qty 1

## 2020-07-21 MED ORDER — AMLODIPINE BESYLATE 10 MG PO TABS: 10.0000 mg | ORAL_TABLET | Freq: Every day | ORAL | Status: DC

## 2020-07-21 MED ORDER — DOCUSATE SODIUM 100 MG PO CAPS: 100.0000 mg | ORAL_CAPSULE | Freq: Two times a day (BID) | ORAL | Status: DC

## 2020-07-21 MED ORDER — ACETAMINOPHEN 10 MG/ML IV SOLN
INTRAVENOUS | Status: AC
  Filled 2020-07-21: qty 100

## 2020-07-21 MED ORDER — ROCURONIUM BROMIDE 10 MG/ML (PF) SYRINGE
PREFILLED_SYRINGE | INTRAVENOUS | Status: DC | PRN
  Administered 2020-07-21: 60 mg via INTRAVENOUS
  Administered 2020-07-21: 20 mg via INTRAVENOUS

## 2020-07-21 MED ORDER — HYDROMORPHONE HCL 1 MG/ML IJ SOLN
INTRAMUSCULAR | Status: AC
  Administered 2020-07-21: 0.5 mg via INTRAVENOUS
  Filled 2020-07-21: qty 1

## 2020-07-21 MED ORDER — OXYCODONE HCL 5 MG PO TABS
5.0000 mg | ORAL_TABLET | ORAL | Status: DC | PRN
  Administered 2020-07-22: 5 mg via ORAL
  Filled 2020-07-21: qty 1

## 2020-07-21 MED ORDER — CLINDAMYCIN PHOSPHATE 600 MG/50ML IV SOLN
600.0000 mg | Freq: Three times a day (TID) | INTRAVENOUS | Status: DC
  Administered 2020-07-21 – 2020-07-22 (×2): 600 mg via INTRAVENOUS
  Filled 2020-07-21 (×2): qty 50

## 2020-07-21 MED ORDER — OXYCODONE HCL 5 MG/5ML PO SOLN: 5.0000 mg | Freq: Once | ORAL | Status: DC | PRN

## 2020-07-21 MED ORDER — FENTANYL CITRATE (PF) 250 MCG/5ML IJ SOLN
INTRAMUSCULAR | Status: DC | PRN
  Administered 2020-07-21: 50 ug via INTRAVENOUS
  Administered 2020-07-21 (×3): 100 ug via INTRAVENOUS
  Administered 2020-07-21: 50 ug via INTRAVENOUS

## 2020-07-21 MED ORDER — PROPOFOL 10 MG/ML IV BOLUS
INTRAVENOUS | Status: DC | PRN
  Administered 2020-07-21: 160 mg via INTRAVENOUS

## 2020-07-21 MED ORDER — DIPHENHYDRAMINE HCL 50 MG/ML IJ SOLN: 12.5000 mg | Freq: Four times a day (QID) | INTRAMUSCULAR | Status: DC | PRN

## 2020-07-21 MED ORDER — DIPHENHYDRAMINE HCL 12.5 MG/5ML PO ELIX: 12.5000 mg | ORAL_SOLUTION | Freq: Four times a day (QID) | ORAL | Status: DC | PRN

## 2020-07-21 MED ORDER — ONDANSETRON HCL 4 MG/2ML IJ SOLN
INTRAMUSCULAR | Status: DC | PRN
  Administered 2020-07-21: 4 mg via INTRAVENOUS

## 2020-07-21 SURGICAL SUPPLY — 52 items
BAG LAPAROSCOPIC 12 15 PORT 16 (BASKET) ×1 IMPLANT
BAG RETRIEVAL 12/15 (BASKET) ×2
CHLORAPREP W/TINT 26 (MISCELLANEOUS) ×2 IMPLANT
CLIP VESOLOCK LG 6/CT PURPLE (CLIP) ×2 IMPLANT
CLIP VESOLOCK MED LG 6/CT (CLIP) ×2 IMPLANT
CLIP VESOLOCK XL 6/CT (CLIP) ×2 IMPLANT
COVER SURGICAL LIGHT HANDLE (MISCELLANEOUS) ×2 IMPLANT
COVER TIP SHEARS 8 DVNC (MISCELLANEOUS) ×1 IMPLANT
COVER TIP SHEARS 8MM DA VINCI (MISCELLANEOUS) ×2
COVER WAND RF STERILE (DRAPES) IMPLANT
CUTTER ECHEON FLEX ENDO 45 340 (ENDOMECHANICALS) ×2 IMPLANT
DECANTER SPIKE VIAL GLASS SM (MISCELLANEOUS) ×2 IMPLANT
DERMABOND ADVANCED (GAUZE/BANDAGES/DRESSINGS) ×1
DERMABOND ADVANCED .7 DNX12 (GAUZE/BANDAGES/DRESSINGS) ×1 IMPLANT
DRAPE ARM DVNC X/XI (DISPOSABLE) ×4 IMPLANT
DRAPE COLUMN DVNC XI (DISPOSABLE) ×1 IMPLANT
DRAPE DA VINCI XI ARM (DISPOSABLE) ×8
DRAPE DA VINCI XI COLUMN (DISPOSABLE) ×2
DRAPE INCISE IOBAN 66X45 STRL (DRAPES) ×2 IMPLANT
DRAPE SHEET LG 3/4 BI-LAMINATE (DRAPES) ×2 IMPLANT
ELECT PENCIL ROCKER SW 15FT (MISCELLANEOUS) ×2 IMPLANT
ELECT REM PT RETURN 15FT ADLT (MISCELLANEOUS) ×2 IMPLANT
GLOVE SURG ENC MOIS LTX SZ6.5 (GLOVE) ×2 IMPLANT
GLOVE SURG ENC TEXT LTX SZ7.5 (GLOVE) ×4 IMPLANT
GOWN STRL REUS W/TWL LRG LVL3 (GOWN DISPOSABLE) ×2 IMPLANT
GOWN STRL REUS W/TWL XL LVL3 (GOWN DISPOSABLE) ×4 IMPLANT
HEMOSTAT SURGICEL 4X8 (HEMOSTASIS) ×2 IMPLANT
HOLDER FOLEY CATH W/STRAP (MISCELLANEOUS) ×2 IMPLANT
IRRIG SUCT STRYKERFLOW 2 WTIP (MISCELLANEOUS) ×2
IRRIGATION SUCT STRKRFLW 2 WTP (MISCELLANEOUS) ×1 IMPLANT
KIT BASIN OR (CUSTOM PROCEDURE TRAY) ×2 IMPLANT
KIT TURNOVER KIT A (KITS) ×2 IMPLANT
MARKER SKIN DUAL TIP RULER LAB (MISCELLANEOUS) ×2 IMPLANT
NEEDLE INSUFFLATION 14GA 120MM (NEEDLE) ×2 IMPLANT
PROTECTOR NERVE ULNAR (MISCELLANEOUS) ×4 IMPLANT
SCISSORS LAP 5X45 EPIX DISP (ENDOMECHANICALS) ×2 IMPLANT
SEAL CANN UNIV 5-8 DVNC XI (MISCELLANEOUS) ×3 IMPLANT
SEAL XI 5MM-8MM UNIVERSAL (MISCELLANEOUS) ×6
SET TUBE SMOKE EVAC HIGH FLOW (TUBING) ×2 IMPLANT
SOLUTION ELECTROLUBE (MISCELLANEOUS) ×2 IMPLANT
STAPLE RELOAD 45 WHT (STAPLE) ×2 IMPLANT
STAPLE RELOAD 45MM WHITE (STAPLE) ×4
SUT MNCRL AB 4-0 PS2 18 (SUTURE) ×4 IMPLANT
SUT PDS AB 0 CT1 36 (SUTURE) ×4 IMPLANT
SUT VICRYL 0 UR6 27IN ABS (SUTURE) ×2 IMPLANT
TOWEL OR 17X26 10 PK STRL BLUE (TOWEL DISPOSABLE) ×2 IMPLANT
TOWEL OR NON WOVEN STRL DISP B (DISPOSABLE) ×2 IMPLANT
TRAY FOLEY MTR SLVR 16FR STAT (SET/KITS/TRAYS/PACK) ×2 IMPLANT
TRAY LAPAROSCOPIC (CUSTOM PROCEDURE TRAY) ×2 IMPLANT
TROCAR BLADELESS OPT 5 100 (ENDOMECHANICALS) ×2 IMPLANT
TROCAR XCEL 12X100 BLDLESS (ENDOMECHANICALS) ×2 IMPLANT
WATER STERILE IRR 1000ML POUR (IV SOLUTION) ×2 IMPLANT

## 2020-07-21 NOTE — Transfer of Care (Signed)
Immediate Anesthesia Transfer of Care Note  Patient: [name] [name]  Procedure(s) Performed: XI ROBOTIC ASSISTED LAPAROSCOPIC RADICAL NEPHRECTOMY (Right )  Patient Location: PACU  Anesthesia Type:General  Level of Consciousness: awake, alert , oriented and patient cooperative  Airway & Oxygen Therapy: Patient Spontanous Breathing and Patient connected to face mask oxygen  Post-op Assessment: Report given to RN and Post -op Vital signs reviewed and stable  Post vital signs: Reviewed and stable  Last Vitals:  Vitals Value Taken Time  BP 142/75 07/21/20 1545  Temp    Pulse 81 07/21/20 1550  Resp 17 07/21/20 1550  SpO2 100 % 07/21/20 1550  Vitals shown include unvalidated device data.  Last Pain:  Vitals:   07/21/20 1108  TempSrc:   PainSc: 0-No pain         Complications: No complications documented.

## 2020-07-21 NOTE — OR PostOp (Incomplete)
PACU TO INPATIENT HANDOFF REPORT  Name/Age/Gender [name] [name] 40 y.o. female  Code Status Code Status History    Date Active Date Inactive Code Status Order ID Comments User Context   05/20/2019 1191 05/23/2019 1836 Full Code [id number]  Myrtis Ser, [place] Inpatient   05/19/2019 2007 05/20/2019 0753 Full Code [id number]  Elvera Maria, [place] Inpatient   11/16/2015 0252 11/18/2015 1523 Full Code [id number]  Odie Sera, [facility]   11/15/2015 1417 11/16/2015 0252 Full Code [id number]  Meda Klinefelter, RN Inpatient   Advance Care Planning Activity    Questions for Most Recent Historical Code Status (Order [id number])       Home/SNF/Other {Discharge Destination:18313::"Home"}  Chief Complaint Renal mass [N28.89]  Level of Care/Admitting Diagnosis ED Disposition    None      Medical History Past Medical History:  Diagnosis Date  . ADHD   . Anemia   . Anxiety   . Depression   . Gestational hypertension 2017  . Headache    Migraines  . History of kidney stones   . Kidney infection 2009  . Renal mass     Allergies Allergies  Allergen Reactions  . Lisinopril Hives and Itching    Hives on face,ears and neck  . Bactrim [Sulfamethoxazole-Trimethoprim] Hives and Swelling  . Dust Mite Extract Hives  . Penicillins Other (See Comments)    Fever-pt was really sick. Has patient had a PCN reaction causing immediate rash, facial/tongue/throat swelling, SOB or lightheadedness with hypotension: No Has patient had a PCN reaction causing severe rash involving mucus membranes or skin necrosis: No Has patient had a PCN reaction that required hospitalization Yes Has patient had a PCN reaction occurring within the last 10 years: No If all of the above answers are "NO", then may proceed with Cephalosporin use.     IV Location/Drains/Wounds Patient Lines/Drains/Airways Status    Active Line/Drains/Airways    Name Placement date Placement time Site Days    Peripheral IV 07/21/20 Right;Lateral Wrist 07/21/20  1110  Wrist  less than 1   Urethral Catheter F ROZEK RN Straight-tip 14 Fr. 07/21/20  1314  Straight-tip  less than 1   Incision (Closed) 07/21/20 Abdomen 07/21/20  1530  - less than 1   Incision - 5 Ports Abdomen Right;Lateral;Lower Right;Medial;Lower Umbilicus Mid;Upper Left;Mid;Upper 07/21/20  1336  - less than 1          Labs/Imaging Results for orders placed or performed during the hospital encounter of 07/21/20 (from the past 48 hour(s))  Pregnancy, urine     Status: None   Collection Time: 07/21/20 10:37 AM  Result Value Ref Range   Preg Test, Ur NEGATIVE NEGATIVE    Comment:        THE SENSITIVITY OF THIS METHODOLOGY IS >20 mIU/mL. Performed at [facility], [place] [address], [place], [place] [address]   Hemoglobin and hematocrit, blood     Status: None   Collection Time: 07/21/20  4:13 PM  Result Value Ref Range   Hemoglobin 13.2 12.0 - 15.0 g/dL   HCT 41.3 36.0 - 46.0 %    Comment: Performed at [facility], [place] [address] [place], [place] [address]   No results found.  Pending Labs   Vitals/Pain Today's Vitals   07/21/20 1645 07/21/20 1700 07/21/20 1715 07/21/20 1730  BP: (!) 163/92 (!) 141/87 136/86 (!) 144/88  Pulse: 62 66 64 68  Resp: 12 10 (!) 7 (!) 9  Temp:  TempSrc:      SpO2: 100% 100% 100% 100%  Weight:      Height:      PainSc: 7  Asleep Asleep 8     Isolation Precautions @ISOLATION @  Administered Medications Periop Administered Meds from 07/21/2020 1024 to 07/21/2020 1813      Date/Time Order Dose Route Action Action by Comments    07/21/2020 1733 acetaminophen (OFIRMEV) IV 1,000 mg 1,000 mg Intravenous New Bag/Given Dewain Penning, RN     07/21/2020 1353 bupivacaine liposome (EXPAREL) 1.3 % injection 266 mg 20 mL Infiltration Given Ceasar Mons, MD     07/21/2020 1111 chlorhexidine (PERIDEX) 0.12 % solution 15 mL 15 mL  Mouth/Throat Given Debbora Dus, RN     07/21/2020 1258 clindamycin (CLEOCIN) IVPB 900 mg 900 mg Intravenous Given Raenette Rover, CRNA     07/21/2020 1304 dexamethasone (DECADRON) injection 10 mg Intravenous Given Raenette Rover, CRNA     07/21/2020 1305 droperidol (INAPSINE) 2.5 MG/ML injection 0.625 mg Intravenous Given Raenette Rover, CRNA     07/21/2020 1541 fentaNYL citrate (PF) (SUBLIMAZE) injection 50 mcg Intravenous Given Raenette Rover, CRNA     07/21/2020 1453 fentaNYL citrate (PF) (SUBLIMAZE) injection 100 mcg Intravenous Given Raenette Rover, CRNA     07/21/2020 1354 fentaNYL citrate (PF) (SUBLIMAZE) injection 50 mcg Intravenous Given Raenette Rover, CRNA     07/21/2020 1307 fentaNYL citrate (PF) (SUBLIMAZE) injection 100 mcg Intravenous Given Raenette Rover, CRNA     07/21/2020 1255 fentaNYL citrate (PF) (SUBLIMAZE) injection 100 mcg Intravenous Given Raenette Rover, CRNA     07/21/2020 1644 HYDROmorphone (DILAUDID) injection 0.25-0.5 mg 0.5 mg Intravenous Given Dewain Penning, RN     07/21/2020 1544 lactated ringers infusion   Intravenous Anesthesia Volume Adjustment Raenette Rover, CRNA     07/21/2020 1525 lactated ringers infusion   Intravenous Anesthesia Volume Adjustment Raenette Rover, CRNA     07/21/2020 1340 lactated ringers infusion   Intravenous New Bag/Given Raenette Rover, CRNA     07/21/2020 1339 lactated ringers infusion   Intravenous Paused Raenette Rover, CRNA Switch to gravity    07/21/2020 1250 lactated ringers infusion   Intravenous Continued from Pre-op Raenette Rover, CRNA     07/21/2020 1111 lactated ringers infusion   Intravenous New Bag/Given Debbora Dus, RN     07/21/2020 1353 lactated ringers irrigation solution 1,000 mL Irrigation Given Winter, Christopher Aaron, MD     07/21/2020 1255 lidocaine (cardiac) 100 mg/51mL (XYLOCAINE) injection 2% 100 mg Intravenous Given Raenette Rover, CRNA     07/21/2020 1503  lidocaine 2% (20 mg/mL) 5 mL syringe 0 mg/kg/hr Intravenous Stopped Raenette Rover, CRNA     07/21/2020 1320 lidocaine 2% (20 mg/mL) 5 mL syringe 1.5 mg/kg/hr Intravenous New Bag/Given Raenette Rover, CRNA     07/21/2020 1111 MEDLINE mouth rinse   Mouth Rinse See Alternative Debbora Dus, RN     07/21/2020 1248 midazolam (VERSED) injection 2 mg Intravenous Given Raenette Rover, CRNA     07/21/2020 1504 ondansetron (ZOFRAN) injection 4 mg Intravenous Given Raenette Rover, CRNA     07/21/2020 1301 propofol (DIPRIVAN) 10 mg/mL bolus/IV push 160 mg Intravenous Given Raenette Rover, CRNA     07/21/2020 1428 rocuronium bromide 10 mg/mL (PF) syringe 20 mg Intravenous Given Raenette Rover, CRNA     07/21/2020 1301 rocuronium bromide 10 mg/mL (PF) syringe 60 mg Intravenous Given Hedy Camara  L, CRNA     07/21/2020 1353 sodium chloride (PF) 0.9 % injection 20 mL Other Given Winter, Christopher Aaron, MD mixed with experel for injection    07/21/2020 1534 sugammadex sodium (BRIDION) injection 200 mg Intravenous Given Raenette Rover, CRNA       Mobility {Mobility:20148}

## 2020-07-21 NOTE — Anesthesia Procedure Notes (Signed)
Procedure Name: Intubation Date/Time: 07/21/2020 1:03 PM Performed by: Raenette Rover, CRNA Pre-anesthesia Checklist: Patient identified, Emergency Drugs available, Suction available and Patient being monitored Patient Re-evaluated:Patient Re-evaluated prior to induction Oxygen Delivery Method: Circle system utilized Preoxygenation: Pre-oxygenation with 100% oxygen Induction Type: IV induction Ventilation: Mask ventilation without difficulty Laryngoscope Size: Mac and 3 Grade View: Grade I Tube type: Oral Tube size: 7.0 mm Number of attempts: 1 Airway Equipment and Method: Stylet Placement Confirmation: ETT inserted through vocal cords under direct vision,  positive ETCO2 and breath sounds checked- equal and bilateral Secured at: 21 cm Tube secured with: Tape Dental Injury: Teeth and Oropharynx as per pre-operative assessment

## 2020-07-21 NOTE — Anesthesia Preprocedure Evaluation (Signed)
Anesthesia Evaluation  Patient identified by MRN, date of birth, ID band Patient awake    Reviewed: Allergy & Precautions, NPO status , Patient's Chart, lab work & pertinent test results  Airway Mallampati: II  TM Distance: >3 FB Neck ROM: Full    Dental no notable dental hx.    Pulmonary neg pulmonary ROS,    Pulmonary exam normal breath sounds clear to auscultation       Cardiovascular hypertension, Pt. on medications Normal cardiovascular exam Rhythm:Regular Rate:Normal     Neuro/Psych Anxiety negative neurological ROS     GI/Hepatic negative GI ROS, Neg liver ROS,   Endo/Other  negative endocrine ROS  Renal/GU negative Renal ROS  negative genitourinary   Musculoskeletal negative musculoskeletal ROS (+)   Abdominal   Peds negative pediatric ROS (+)  Hematology negative hematology ROS (+)   Anesthesia Other Findings   Reproductive/Obstetrics negative OB ROS                             Anesthesia Physical Anesthesia Plan  ASA: II  Anesthesia Plan: General   Post-op Pain Management:    Induction: Intravenous  PONV Risk Score and Plan: 3 and Ondansetron, Dexamethasone, Midazolam, Treatment may vary due to age or medical condition and Droperidol  Airway Management Planned: Oral ETT  Additional Equipment:   Intra-op Plan:   Post-operative Plan: Extubation in OR  Informed Consent: I have reviewed the patients History and Physical, chart, labs and discussed the procedure including the risks, benefits and alternatives for the proposed anesthesia with the patient or authorized representative who has indicated his/her understanding and acceptance.     Dental advisory given  Plan Discussed with: CRNA and Surgeon  Anesthesia Plan Comments:         Anesthesia Quick Evaluation

## 2020-07-21 NOTE — Op Note (Signed)
Operative Note  Preoperative diagnosis:  1.  7.7 cm solid and enhancing right renal mass  Postoperative diagnosis: 1.  7.7 cm solid enhancing right renal mass  Procedure(s): 1.  Robot-assisted laparoscopic right radical nephrectomy (adrenal sparing)  Surgeon: Ellison Hughs, MD  Assistants: Debbrah Alar, PA-C  An assistant was required for this surgical procedure.  The duties of the assistant included but were not limited to suctioning, passing suture, camera manipulation, retraction.  This procedure would not be able to be performed without an Environmental consultant.   Anesthesia:  General  Complications:  None  EBL: 50 mL  Specimens: 1.  Right kidney  Drains/Catheters: 1.  Foley catheter  Intraoperative findings:   1. Solid right renal mass 2. Hemostatic right renal hilum following ligation  Indication:  [name] [name] is a 40 y.o. female with a 7.7 cm solid enhancing right renal mass with features concerning for renal cell carcinoma.  She has been consented for the above procedures, voices understanding and wishes to proceed.  Description of procedure:  After informed consent was signed, the patient was taken back to the operating room and properly anesthetized.  The patient was then placed in the left lateral decubitus position with all pressure points padded.  The abdomen was then prepped and draped in the usual sterile fashion.  A time-out was then performed.     An 8 mm incision was then made lateral to the right rectus muscle at the level of the right 12th rib.  A Veress needle was then used to access the abdominal cavity.  A saline drop test showed no signs of obstruction and aspiration of the Veress needle revealed no blood or sucus.  The abdominal cavity was then insufflated to 15 mmHg.  An 8 mm robotic trocar was then atraumatically inserted into the abdominal cavity.  The robotic camera was then inserted through the port and inspection of the abdominal cavity revealed no  evidence of adjacent organ or vessel injury. We then placed three additional 8 mm robotic ports and a 12 mm assistant port in such as fashion as to triangulate the right renal hilum.  The robot was then docked into postion.   Using a combination of blunt and cold scissors dissection, the hepatic attachments were released from the abdominal sidewall.  A locking grasper was then inserted through the 5 mm sub-xyphoid port and used to retract the posterior surface of the liver more cephalad.  The white line of Toldt along the ascending colon was then incised, allowing [place] to reflect the colon medially and expose the anterior surface of the right kidney.  The duodenum was then Kocherized medially, which abruptly led [place] to the identification of the inferior vena cava.    Once the colon was adequately mobilized, we moved to the lower pole and identified the gonadal vein and ureter.  The gonadal vein was then left running parallel to the vena cava and the right ureter was reflected anteriorly.  Using cautious cautery, the overlying perihilar attachments were then released.  This yielded visualization of the renal hilum, which included a bifurcated right renal vein and a bifurcating right renal artery.  The perilymphatic tissue surrounding the right renal artery were carefully released so that the right renal artery was fully encircled.    Due to the complexity of the right renal vein, its multiple branches were ligated with Hem-o-lok clips and sharply incised with laparoscopic scissors.  A 45 mm powered endovascular stapler was then used to ligate the right  renal artery.  The right hilar stump was hemostatic following staple ligation.    The 45 mm powered endovascular stapler was then used to incise the  lower pole attachments.  The remaining perinephric attachments were then incised using electrocautery.  The right adrenal gland was spared.  Reinspection of the right retroperitoneal space revealed excellent  hemostasis. Once the right kidney was fully mobile, it was placed in an Endo Catch bag and left in the abdominal cavity.   The 12 mm midline assistant port was then closed using the [employer] technique with a 0 Vicryl suture.  A right lower quadrant Gibson incision was then made in the right kidney was removed within the Endo Catch bag.  The fascia of the external and internal oblique were then closed with a running 0 PDS suture.  The skin incisions were then closed using 4-0 Monocryl.  Dermabond was applied to all skin incisions.  Plan: Monitor on the floor overnight

## 2020-07-21 NOTE — Anesthesia Postprocedure Evaluation (Signed)
Anesthesia Post Note  Patient: [name] [name]  Procedure(s) Performed: XI ROBOTIC ASSISTED LAPAROSCOPIC RADICAL NEPHRECTOMY (Right )     Patient location during evaluation: PACU Anesthesia Type: General Level of consciousness: sedated Pain management: pain level controlled Vital Signs Assessment: post-procedure vital signs reviewed and stable Respiratory status: spontaneous breathing and respiratory function stable Cardiovascular status: stable Postop Assessment: no apparent nausea or vomiting Anesthetic complications: no   No complications documented.  Last Vitals:  Vitals:   07/21/20 1730 07/21/20 1815  BP: (!) 144/88 137/77  Pulse: 68 68  Resp: (!) 9 11  Temp:    SpO2: 100% 100%    Last Pain:  Vitals:   07/21/20 1815  TempSrc:   PainSc: 5                  Ranisha Allaire DANIEL

## 2020-07-21 NOTE — Discharge Instructions (Signed)

## 2020-07-21 NOTE — H&P (Signed)
Urology Preoperative H&P   Chief Complaint: Right renal mass  History of Present Illness: [name] [name] is a 40 y.o. female with an 8.5 x 5.5 cm solid enhancing right lower pole renal mass with features concerning for renal cell carcinoma. The lesion was discovered on CT with contrast on 06/09/2020 during a work-up for right lower quadrant pain and gross hematuria for the past week. The mass appears to be invading into the collecting system and is causing right-sided hydronephrosis. The left kidney exhibits no solid parenchymal lesions or hydronephrosis, but she does have a 5 mm left renal stone.  -Non-smoker  -No personal/family history of GU malignancies  -No hx of chemical exposure -MRI on 06/28/20 confirmed no renal vein involvement   Past Medical History:  Diagnosis Date  . ADHD   . Anemia   . Anxiety   . Depression   . Gestational hypertension 2017  . Headache    Migraines  . History of kidney stones   . Kidney infection 2009  . Renal mass     Past Surgical History:  Procedure Laterality Date  . DILATION AND EVACUATION N/A 11/27/2017   Procedure: DILATATION AND EVACUATION;  Surgeon: Osborne Oman, MD;  Location: [facility];  Service: Gynecology;  Laterality: N/A;  . FEMUR FRACTURE SURGERY Left    metal rod  . LEG SURGERY Left 1999   steel rod in left femur bone     Allergies:  Allergies  Allergen Reactions  . Lisinopril Hives and Itching    Hives on face,ears and neck  . Bactrim [Sulfamethoxazole-Trimethoprim] Hives and Swelling  . Dust Mite Extract Hives  . Penicillins Other (See Comments)    Fever-pt was really sick. Has patient had a PCN reaction causing immediate rash, facial/tongue/throat swelling, SOB or lightheadedness with hypotension: No Has patient had a PCN reaction causing severe rash involving mucus membranes or skin necrosis: No Has patient had a PCN reaction that required hospitalization Yes Has patient had a PCN reaction occurring within the last 10  years: No If all of the above answers are "NO", then may proceed with Cephalosporin use.     Family History  Problem Relation Age of Onset  . Hyperlipidemia Mother   . Heart disease Maternal Aunt   . Heart disease Maternal Uncle   . Heart disease Paternal Aunt   . Heart disease Maternal Grandfather   . Heart disease Paternal Grandfather     Social History:  reports that she has never smoked. She has never used smokeless tobacco. She reports previous alcohol use. She reports that she does not use drugs.  ROS: A complete review of systems was performed.  All systems are negative except for pertinent findings as noted.  Physical Exam:  Vital signs in last 24 hours:   Constitutional:  Alert and oriented, No acute distress Cardiovascular: Regular rate and rhythm, No JVD Respiratory: Normal respiratory effort, Lungs clear bilaterally GI: Abdomen is soft, nontender, nondistended, no abdominal masses GU: No CVA tenderness Lymphatic: No lymphadenopathy Neurologic: Grossly intact, no focal deficits Psychiatric: Normal mood and affect  Laboratory Data:  No results for input(s): WBC, HGB, HCT, PLT in the last 72 hours.  No results for input(s): NA, K, CL, GLUCOSE, BUN, CALCIUM, CREATININE in the last 72 hours.  Invalid input(s): CO3   No results found for this or any previous visit (from the past 24 hour(s)). Recent Results (from the past 240 hour(s))  SARS CORONAVIRUS 2 (TAT 6-24 HRS) Nasopharyngeal Nasopharyngeal Swab  Status: None   Collection Time: 07/19/20 10:46 AM   Specimen: Nasopharyngeal Swab  Result Value Ref Range Status   SARS Coronavirus 2 NEGATIVE NEGATIVE Final    Comment: (NOTE) SARS-CoV-2 target nucleic acids are NOT DETECTED.  The SARS-CoV-2 RNA is generally detectable in upper and lower respiratory specimens during the acute phase of infection. Negative results do not preclude SARS-CoV-2 infection, do not rule out co-infections with other pathogens, and  should not be used as the sole basis for treatment or other patient management decisions. Negative results must be combined with clinical observations, patient history, and epidemiological information. The expected result is Negative.  Fact Sheet for Patients: SugarRoll.be  Fact Sheet for Healthcare Providers: https://www.woods-mathews.com/  This test is not yet approved or cleared by the [place] FDA and  has been authorized for detection and/or diagnosis of SARS-CoV-2 by FDA under an Emergency Use Authorization (EUA). This EUA will remain  in effect (meaning this test can be used) for the duration of the COVID-19 declaration under Se ction 564(b)(1) of the Act, 21 U.S.C. section 360bbb-3(b)(1), unless the authorization is terminated or revoked sooner.  Performed at [facility], [place] [address] [place], [place] [address]     Renal Function: No results for input(s): CREATININE in the last 168 hours. Estimated Creatinine Clearance: 84.2 mL/min (by C-G formula based on SCr of 0.46 mg/dL).  Radiologic Imaging: CLINICAL DATA:  Evaluate right renal mass seen on recent CT scan.  EXAM: MRI ABDOMEN WITHOUT AND WITH CONTRAST  TECHNIQUE: Multiplanar multisequence MR imaging of the abdomen was performed both before and after the administration of intravenous contrast.  CONTRAST:  14mL GADAVIST GADOBUTROL 1 MMOL/ML IV SOLN  COMPARISON:  [facility] Oz consisting  FINDINGS: Unfortunately, there is significant upper abdominal artifact likely due to something in the duodenum or stomach. Apparently the patient takes iron pills and took them before the MRI.  Lower chest: The lung bases are clear of an acute process. No worrisome pulmonary lesions. No pleural or pericardial effusion.  Hepatobiliary: No hepatic lesions or intrahepatic biliary dilatation. The gallbladder is unremarkable. No common bile duct dilatation.  Pancreas:  Poorly visualized due to artifact from the stomach. Do not see any abnormality on the T2 weighted images and the pancreas appears normal on the recent CT scan.  Spleen:  Normal size.  No focal lesions.  Adrenals/Urinary Tract:  The adrenal glands are unremarkable.  As demonstrated on the CT scan there is a large mass associated with the lower pole region of the right kidney. This has fairly homogeneous low T2 signal intensity and demonstrates mild contrast enhancement. 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 There is a smaller lesion involving the upper pole region right kidney posteriorly. This demonstrates slight increased T1 signal intensity and no contrast enhancement most consistent with a benign hemorrhagic or proteinaceous cyst.  The left kidney is unremarkable.  Stomach/Bowel: The stomach, duodenum and upper small bowel is somewhat obscured by artifact.  Vascular/Lymphatic: The aorta and branch vessels are patent. No mesenteric or retroperitoneal mass or adenopathy.  Other:  No ascites or abdominal wall hernia.  Musculoskeletal: No significant bony findings.  IMPRESSION: 1. 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 No evidence of renal vein involvement, retroperitoneal adenopathy or metastatic disease elsewhere. 2. Small hemorrhagic or proteinaceous cyst involving the upper pole region right kidney. 3. Significant upper abdominal artifact likely due to ingested iron  pills in the stomach and duodenum.   Electronically Signed   By: Marijo Sanes M.D.   On: 06/29/2020 13:57  I independently reviewed the above imaging studies.  Assessment and Plan [name] [name] is a 40 y.o. female with a solid and enhancing right renal mass with features concerning for renal cell carcinoma.   -I reviewed imaging results and films with the patient personally. We discussed that  the mass in question has features concerning for malignancy. I explained the natural history of presumed renal cell carcinoma. I reviewed the AUA guidelines for evaluation and treatment of renal masses. The options of active surveillance, in situ tumor ablation, partial and radical nephrectomy was discussed. The risks of robot assisted, laparoscopic RIGHT radical nephrectomy were discussed in detail including but not limited to: negative pathology, open conversion, infection of the skin/abdominal cavity, VTE, MI/CVA, lymphatic leak, injury to adjacent solid/hollow viscus organs, bleeding requiring a blood transfusion, catastrophic bleeding, hernia formation and other imponderables. The patient voices understanding and wishes to proceed.       Ellison Hughs, MD 07/21/2020, 9:58 AM  [facility] Pager: [phone]

## 2020-07-22 ENCOUNTER — Encounter (HOSPITAL_COMMUNITY): Payer: Self-pay | Admitting: Urology

## 2020-07-22 ENCOUNTER — Other Ambulatory Visit (HOSPITAL_COMMUNITY): Payer: Self-pay

## 2020-07-22 LAB — BASIC METABOLIC PANEL
Anion gap: 10 (ref 5–15)
BUN: 13 mg/dL (ref 6–20)
CO2: 25 mmol/L (ref 22–32)
Calcium: 9.4 mg/dL (ref 8.9–10.3)
Chloride: 104 mmol/L (ref 98–111)
Creatinine, Ser: 1.35 mg/dL — ABNORMAL HIGH (ref 0.44–1.00)
GFR, Estimated: 51 mL/min — ABNORMAL LOW (ref >60)
Glucose, Bld: 121 mg/dL — ABNORMAL HIGH (ref 70–99)
Potassium: 3.9 mmol/L (ref 3.5–5.1)
Sodium: 139 mmol/L (ref 135–145)

## 2020-07-22 LAB — HEMOGLOBIN AND HEMATOCRIT, BLOOD
HCT: 36.4 % (ref 36.0–46.0)
Hemoglobin: 12.1 g/dL (ref 12.0–15.0)

## 2020-07-22 MED ORDER — PHENOL 1.4 % MT LIQD: 1.0000 | OROMUCOSAL | Status: DC | PRN

## 2020-07-22 MED ORDER — OXYCODONE-ACETAMINOPHEN 5-325 MG PO TABS: 1.0000 | ORAL_TABLET | Freq: Four times a day (QID) | ORAL | 0 refills | Status: DC | PRN

## 2020-07-22 MED ORDER — CHLORHEXIDINE GLUCONATE CLOTH 2 % EX PADS: 6.0000 | MEDICATED_PAD | Freq: Every day | CUTANEOUS | Status: DC

## 2020-07-22 MED ORDER — MENTHOL 3 MG MT LOZG
1.0000 | LOZENGE | OROMUCOSAL | Status: DC | PRN
  Filled 2020-07-22: qty 9

## 2020-07-22 NOTE — Progress Notes (Signed)
1 Day Post-Op Subjective: Pain well controlled.  Had some nausea last night has resolved.  No emesis.  No flatus or bowel movement.  Ambulating well.  Tolerating Foley catheter.  Tolerating clears.  Afebrile.  Objective: Vital signs in last 24 hours: Temp:  [97.5 F (36.4 C)-99.2 F (37.3 C)] 98.6 F (37 C) (05/07 0811) Pulse Rate:  [55-91] 55 (05/07 0811) Resp:  [7-18] 18 (05/07 0811) BP: (112-164)/(66-96) 115/70 (05/07 0811) SpO2:  [99 %-100 %] 99 % (05/07 0811) Weight:  [69.5 kg] 69.5 kg (05/06 1108)  Intake/Output from previous day: 05/06 0701 - 05/07 0700 In: 2886.3 [P.O.:480; I.V.:2306.3; IV Piggyback:100] Out: 1000 [Urine:800; Blood:200] Intake/Output this shift: Total I/O In: -  Out: 2200 [Urine:2200]   UOP: 2.2 L clear  Physical Exam:  General: Alert and oriented CV: RRR Lungs: Clear Abdomen: Soft, ND, ATTP; inc c/d/I Ext: NT, No erythema  Lab Results: Recent Labs    07/21/20 1613 07/22/20 0423  HGB 13.2 12.1  HCT 41.3 36.4   BMET Recent Labs    07/22/20 0423  NA 139  K 3.9  CL 104  CO2 25  GLUCOSE 121*  BUN 13  CREATININE 1.35*  CALCIUM 9.4     Studies/Results: No results found.  Assessment/Plan: 1. Right renal mass: S/p robotic assisted laparoscopic right radical nephrectomy on 07/21/2020  -Pain control as needed -Diet as tolerated -Void trial -Hemoglobin stable 12.1 -Creatinine slight bump at 1.3 however was 1.2 in 05/2020.  We will plan to follow-up as outpatient. -OOB, amb, IS -Appropriate to discharge home after passing void trial and tolerating diet   LOS: 1 day   Matt R. Kemonte Ullman MD 07/22/2020, [place] [facility]  Pager: [phone]

## 2020-07-22 NOTE — Progress Notes (Signed)
Patient ambulated up and down the full length of the hall 4 times with a walker without any difficulty. C/O pain and nausea when attempting to lay back down in bed, prn meds given.

## 2020-07-22 NOTE — Discharge Summary (Addendum)
Date of admission: 07/21/2020  Date of discharge: 07/22/2020  Admission diagnosis: Right renal mass  Discharge diagnosis: Right renal mass  Secondary diagnoses: None  History and Physical: For full details, please see admission history and physical. Briefly, [name] [name] is a 40 y.o. year old patient with right renal mass who underwent a robotic assisted laparoscopic right radical nephrectomy on 07/21/2020.   Hospital Course: The patient recovered in the usual expected fashion.  She had her diet advanced slowly.  Initially managed with IV pain control, then transitioned to PO meds when she was tolerating oral intake.  Her labs were stable throughout the hospital course.  She was discharged to home on POD#2.  At the time of discharge she was tolerating a regular diet, passing flatus, ambulating, had adequate pain control and was agreeable to discharge.  Follow up as scheduled.    Laboratory values:  Recent Labs    07/21/20 1613 07/22/20 0423  HGB 13.2 12.1  HCT 41.3 36.4   Recent Labs    07/22/20 0423  CREATININE 1.35*    Disposition: Home  Discharge instruction: The patient was instructed to be ambulatory but told to refrain from heavy lifting, strenuous activity, or driving.   Discharge medications:  Allergies as of 07/22/2020      Reactions   Lisinopril Hives, Itching   Hives on face,ears and neck   Bactrim [sulfamethoxazole-trimethoprim] Hives, Swelling   Dust Mite Extract Hives   Penicillins Other (See Comments)   Fever-pt was really sick. Has patient had a PCN reaction causing immediate rash, facial/tongue/throat swelling, SOB or lightheadedness with hypotension: No Has patient had a PCN reaction causing severe rash involving mucus membranes or skin necrosis: No Has patient had a PCN reaction that required hospitalization Yes Has patient had a PCN reaction occurring within the last 10 years: No If all of the above answers are "NO", then may proceed with Cephalosporin use.       Medication List    STOP taking these medications   aspirin EC 81 MG tablet   ibuprofen 200 MG tablet Commonly known as: ADVIL   ibuprofen 600 MG tablet Commonly known as: ADVIL   multivitamin with minerals Tabs tablet     TAKE these medications   amLODipine 10 MG tablet Commonly known as: NORVASC TAKE 1 TABLET(10 MG) BY MOUTH DAILY   cetirizine 10 MG tablet Commonly known as: ZYRTEC Take 10 mg by mouth daily.   docusate sodium 100 MG capsule Commonly known as: COLACE Take 1 capsule (100 mg total) by mouth 2 (two) times daily.   ferrous sulfate 325 (65 FE) MG tablet Take 325 mg by mouth daily with breakfast.   fluticasone 50 MCG/ACT nasal spray Commonly known as: FLONASE Place into both nostrils daily.   ondansetron 4 MG disintegrating tablet Commonly known as: Zofran ODT Take 1 tablet (4 mg total) by mouth every 8 (eight) hours as needed for nausea or vomiting.   oxyCODONE-acetaminophen 5-325 MG tablet Commonly known as: PERCOCET/ROXICET Take 1 - 2 tablets by mouth every 6  hours as needed for moderate pain or severe pain. What changed:   how much to take  reasons to take this   senna-docusate 8.6-50 MG tablet Commonly known as: Senokot-S Take 1 tablet by mouth at bedtime as needed for mild constipation or moderate constipation.   sertraline 25 MG tablet Commonly known as: Zoloft Take 1 tablet (25 mg total) by mouth daily.       Followup:   Follow-up Information  Ceasar Mons, MD On 08/04/2020.   Specialty: Urology Why: at 9:15 Contact information: [place] [address] [address] [facility]  Pager: [phone]

## 2020-07-24 ENCOUNTER — Other Ambulatory Visit (HOSPITAL_COMMUNITY): Payer: Self-pay

## 2020-07-25 LAB — SURGICAL PATHOLOGY

## 2020-11-09 ENCOUNTER — Other Ambulatory Visit: Payer: Self-pay

## 2020-11-09 ENCOUNTER — Other Ambulatory Visit (HOSPITAL_COMMUNITY): Payer: Self-pay | Admitting: Nurse Practitioner

## 2020-11-09 ENCOUNTER — Ambulatory Visit (HOSPITAL_COMMUNITY)
Admission: RE | Admit: 2020-11-09 | Discharge: 2020-11-09 | Disposition: A | Discharge status: Home / Self Care | Payer: 59 | Source: Ambulatory Visit | Attending: Nurse Practitioner | Admitting: Nurse Practitioner

## 2020-11-09 DIAGNOSIS — C641 Malignant neoplasm of right kidney, except renal pelvis: Secondary | ICD-10-CM | POA: Insufficient documentation

## 2020-12-25 ENCOUNTER — Other Ambulatory Visit: Payer: Self-pay

## 2020-12-25 ENCOUNTER — Ambulatory Visit (INDEPENDENT_AMBULATORY_CARE_PROVIDER_SITE_OTHER): Payer: 59

## 2020-12-25 DIAGNOSIS — O0990 Supervision of high risk pregnancy, unspecified, unspecified trimester: Secondary | ICD-10-CM | POA: Insufficient documentation

## 2020-12-25 DIAGNOSIS — Z3201 Encounter for pregnancy test, result positive: Secondary | ICD-10-CM

## 2020-12-25 DIAGNOSIS — N926 Irregular menstruation, unspecified: Secondary | ICD-10-CM

## 2020-12-25 HISTORY — DX: Supervision of high risk pregnancy, unspecified, unspecified trimester: O09.90

## 2020-12-25 LAB — POCT URINE PREGNANCY: Preg Test, Ur: POSITIVE — AB

## 2020-12-25 NOTE — Progress Notes (Addendum)
   Location: [facility]  Patient: clinic Provider: clinic  PRENATAL INTAKE SUMMARY  Ms. Pleitez presents today New OB Nurse Interview.  OB History     Gravida  6   Para  2   Term  2   Preterm      AB  3   Living  2      SAB  2   IAB      Ectopic      Multiple  0   Live Births  2          I have reviewed the patient's medical, obstetrical, social, and family histories, medications, and available lab results.  SUBJECTIVE She has no unusual complaints Plans to talk about Past tumor on Kidneys diagnose. 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 The patient was provided an opportunity to ask questions and all were answered. The patient agreed with the plan and demonstrated an understanding of the instructions.   The patient was advised to call back or seek an in-person evaluation if the symptoms worsen or if the condition fails to improve as anticipated.  I provided 40 minutes of  face-to-face time during this encounter.  Laury Deep, CMA

## 2020-12-25 NOTE — Addendum Note (Signed)
Addended by: Laury Deep on: 12/25/2020 11:52 AM   Modules accepted: Level of Service

## 2021-02-02 ENCOUNTER — Other Ambulatory Visit (HOSPITAL_COMMUNITY)
Admission: RE | Admit: 2021-02-02 | Discharge: 2021-02-02 | Disposition: A | Discharge status: Home / Self Care | Payer: 59 | Source: Ambulatory Visit

## 2021-02-02 ENCOUNTER — Other Ambulatory Visit: Payer: Self-pay

## 2021-02-02 ENCOUNTER — Ambulatory Visit (INDEPENDENT_AMBULATORY_CARE_PROVIDER_SITE_OTHER): Payer: 59

## 2021-02-02 VITALS — BP 134/64 | HR 79 | Temp 97.7°F | Ht 61.0 in | Wt 156.2 lb

## 2021-02-02 DIAGNOSIS — R519 Headache, unspecified: Secondary | ICD-10-CM

## 2021-02-02 DIAGNOSIS — Z3A11 11 weeks gestation of pregnancy: Secondary | ICD-10-CM

## 2021-02-02 DIAGNOSIS — Z905 Acquired absence of kidney: Secondary | ICD-10-CM

## 2021-02-02 DIAGNOSIS — Z3687 Encounter for antenatal screening for uncertain dates: Secondary | ICD-10-CM

## 2021-02-02 DIAGNOSIS — O0990 Supervision of high risk pregnancy, unspecified, unspecified trimester: Secondary | ICD-10-CM | POA: Insufficient documentation

## 2021-02-02 DIAGNOSIS — O09299 Supervision of pregnancy with other poor reproductive or obstetric history, unspecified trimester: Secondary | ICD-10-CM | POA: Diagnosis not present

## 2021-02-02 DIAGNOSIS — O219 Vomiting of pregnancy, unspecified: Secondary | ICD-10-CM | POA: Insufficient documentation

## 2021-02-02 DIAGNOSIS — R110 Nausea: Secondary | ICD-10-CM

## 2021-02-02 DIAGNOSIS — O26899 Other specified pregnancy related conditions, unspecified trimester: Secondary | ICD-10-CM

## 2021-02-02 LAB — OB RESULTS CONSOLE GC/CHLAMYDIA: Gonorrhea: NEGATIVE

## 2021-02-02 MED ORDER — ONDANSETRON 4 MG PO TBDP: 4.0000 mg | ORAL_TABLET | Freq: Three times a day (TID) | ORAL | 1 refills | Status: DC | PRN

## 2021-02-02 MED ORDER — ONDANSETRON 4 MG PO TBDP
8.0000 mg | ORAL_TABLET | Freq: Once | ORAL | Status: AC
  Administered 2021-02-02: 8 mg via ORAL

## 2021-02-02 NOTE — Progress Notes (Signed)
Subjective:   [name] [name] is a 40 y.o. [id number] at [redacted]w[redacted]d by Unsure  LMP of November 15, 2020 being seen today for her first obstetrical visit.  Patient states this was an unplanned pregnancy, but is desired.  Patient reports she was not on birth control prior to conception.   Pregnancy history fully reviewed. Patient does intend to breast feed. Patient obstetrical history is significant for history of pre-eclampsia, recent nephrectomy, and advanced maternal age.   Patient reports fatigue, nausea, and vomiting.  She goes on to endorse current nausea and states that she experiences "violent vomiting." Patient denies constipation/diarrhea.  Patient reports recurrent headaches and contributes it to increased blood pressure.  She reports she is taking tylenol without relief.  She states the headache is located in the bilateral temples and then radiates to around the head in a "tension" type pattern as it progresses. History of migraines noted in problem list.   Patient reports she had a "tumor" in May and had her kidney removed.  She states it was cancerous and her nephrologist felt it was present for 5-6 years.  She goes on to state that she was not anticipating conception so soon after her recent diagnosis and surgery. She expresses disappointment with need to be transferred to another office for MD mgmt.    HISTORY: OB History  Gravida Para Term Preterm AB Living  6 2 2  0 3 2  SAB IAB Ectopic Multiple Live Births  2 0 0 0 2    # Outcome Date GA Lbr Len/2nd Weight Sex Delivery Anes PTL Lv  6 Current           5 Term 05/20/19 [redacted]w[redacted]d 01:13 / 00:05 6 lb 4.4 oz (2.846 kg) M Vag-Spont None  LIV     Name: Dueitt,BOY [name]     Apgar1: 9  Apgar5: 9  4 AB 11/27/17 [redacted]w[redacted]d    SAB        Birth Comments: D&E for MAB  3 Term 11/16/15 [redacted]w[redacted]d 02:27 / 00:26 6 lb 8.1 oz (2.95 kg) M Vag-Spont None  LIV     Name: Chowning,BOY [name]     Apgar1: 8  Apgar5: 9  2 SAB           1 SAB            Past Medical  History:  Diagnosis Date   ADHD    Anemia    Anxiety    Depression    Gestational hypertension 2017   Headache    Migraines   History of kidney stones    Kidney infection 2009   Renal mass    Past Surgical History:  Procedure Laterality Date   DILATION AND EVACUATION N/A 11/27/2017   Procedure: DILATATION AND EVACUATION;  Surgeon: Osborne Oman, MD;  Location: [facility];  Service: Gynecology;  Laterality: N/A;   FEMUR FRACTURE SURGERY Left    metal rod   LEG SURGERY Left 1999   steel rod in left femur bone    ROBOT ASSISTED LAPAROSCOPIC NEPHRECTOMY Right 07/21/2020   Procedure: XI ROBOTIC ASSISTED LAPAROSCOPIC RADICAL NEPHRECTOMY;  Surgeon: Ceasar Mons, MD;  Location: [facility];  Service: Urology;  Laterality: Right;   Family History  Problem Relation Age of Onset   Hyperlipidemia Mother    Heart disease Maternal Aunt    Heart disease Maternal Uncle    Heart disease Paternal Aunt    Heart disease Maternal Grandfather    Heart disease Paternal  Grandfather    Social History   Tobacco Use   Smoking status: Never   Smokeless tobacco: Never  Vaping Use   Vaping Use: Never used  Substance Use Topics   Alcohol use: Not Currently   Drug use: No   Allergies  Allergen Reactions   Lisinopril Hives and Itching    Hives on face,ears and neck   Bactrim [Sulfamethoxazole-Trimethoprim] Hives and Swelling   Dust Mite Extract Hives   Penicillins Other (See Comments)    Fever-pt was really sick. Has patient had a PCN reaction causing immediate rash, facial/tongue/throat swelling, SOB or lightheadedness with hypotension: No Has patient had a PCN reaction causing severe rash involving mucus membranes or skin necrosis: No Has patient had a PCN reaction that required hospitalization Yes Has patient had a PCN reaction occurring within the last 10 years: No If all of the above answers are "NO", then may proceed with Cephalosporin use.    Current Outpatient Medications on  File Prior to Visit  Medication Sig Dispense Refill   fluticasone (FLONASE) 50 MCG/ACT nasal spray Place into both nostrils daily.     Prenatal MV-Min-Fe Fum-FA-DHA (PRENATAL 1 PO) Take 1 tablet by mouth daily.     No current facility-administered medications on file prior to visit.    Indications for ASA therapy (per uptodate) One of the following: Previous pregnancy with preeclampsia, especially early onset and with an adverse outcome Yes Multifetal gestation No Chronic hypertension Yes Type 1 or 2 diabetes mellitus No Chronic kidney disease Yes Autoimmune disease (antiphospholipid syndrome, systemic lupus erythematosus) No  Two or more of the following: Nulliparity No Obesity (body mass index >30 kg/m2) No Family history of preeclampsia in mother or sister No Age ?35 years Yes Sociodemographic characteristics (African American race, low socioeconomic level) Yes Personal risk factors (eg, previous pregnancy with low birth weight or small for gestational age infant, previous adverse pregnancy outcome [eg, stillbirth], interval >10 years between pregnancies) No  Indications for early 1 hour GTT (per uptodate)  BMI >25 (>23 in Asian women) AND one of the following  Gestational diabetes mellitus in a previous pregnancy No Glycated hemoglobin ?5.7 percent (39 mmol/mol), impaired glucose tolerance, or impaired fasting glucose on previous testing No First-degree relative with diabetes No High-risk race/ethnicity (eg, African American, Latino, Native American, [place] American, Pacific Islander) Yes History of cardiovascular disease No Hypertension or on therapy for hypertension No High-density lipoprotein cholesterol level <35 mg/dL (0.90 mmol/L) and/or a triglyceride level >250 mg/dL (2.82 mmol/L) No Polycystic ovary syndrome No Physical inactivity No Other clinical condition associated with insulin resistance (eg, severe obesity, acanthosis nigricans) No Previous birth of an infant  weighing ?4000 g No Previous stillbirth of unknown cause No   Exam   Vitals:   02/02/21 1026  BP: 134/64  Pulse: 79  Temp: 97.7 F (36.5 C)  Weight: 156 lb 3.2 oz (70.9 kg)  Height: 5\' 1"  (1.549 m)      Assessment:   Pregnancy: [id number] Patient Active Problem List   Diagnosis Date Noted   Nausea and vomiting of pregnancy, antepartum 02/02/2021   Supervision of high risk pregnancy, antepartum 12/25/2020   Renal mass 07/21/2020   Preeclampsia, severe, third trimester 05/19/2019   Stress at work 04/29/2019   Insomnia 04/29/2019   Nightmares 04/29/2019   Heartburn during pregnancy in third trimester 04/29/2019   Dizziness 04/29/2019   History of depression 04/29/2019   Postpartum anxiety 04/29/2019   Visual disturbances 04/29/2019   Chronic migraine 03/26/2019  Penicillin allergy 03/18/2019   History of pre-eclampsia in prior pregnancy, currently pregnant 11/18/2018   Uterine fibroid in pregnancy 10/21/2018   Supervision of other normal pregnancy, antepartum 10/13/2018   Fibroids 09/22/2015   Migraine headache 05/24/2015     Plan:  1. Supervision of high risk pregnancy, antepartum -Congratulations given and patient welcomed back to practice. -Anticipatory guidance for prenatal visits including labs, ultrasounds, and testing; Initial labs drawn. -Informed that due to high risk status she would be transferred to sister office for MD mgmt, but could return for GYN care.  -Genetic Screening discussed, First trimester screen, Quad screen, and NIPS: declined. -Encouraged to complete and utilize MyChart Registration for her ability to review results, send requests, and have questions addressed.  -Discussed estimated due date of June 7th, 2023. -Ultrasound discussed; fetal anatomic survey:  discussed, but to be scheduled after dating [place] . -Continue prenatal vitamins  -Encouraged to seek out care at office or emergency room for urgent and/or emergent concerns. -Educated on the  nature of [place] with multiple MDs and other Advanced Practice Providers was explained to patient; also emphasized that residents, students are part of our team.     2. [redacted] weeks gestation of pregnancy -Unsure of LMP. -Pelvic exam deferred. -Will send for [place] in 2 weeks or earliest available appt. -Anticipatory guidance for upcoming appts. -Patient to schedule next appt in 4-5 weeks for an in-person visit.  3. History of pre-eclampsia in prior pregnancy, currently pregnant -Candidate for bASA.  Informed that provider unsure if appropriate considering recent nephrectomy. -Discussed collaborative care with MFM and likelihood of IOL d/t age and history.  -Baseline labs obtained and pending  4. Nausea and vomiting of pregnancy, antepartum -Currently nauseous -Given zofran 8mg  in office -Rx for Zofran sent to pharmacy on file.   5. Unsure of LMP (last menstrual period) as reason for ultrasound scan -[place] ordered for confirmation  6. Pregnancy headache, antepartum -Discussed initiation of Magnesium supplement.  -Rx sent to pharmacy on file. -Patient to inform provider if no improvement to plan for neurology consult.    7. History of right radical nephrectomy -May 2022 -No follow up notes identified in records.       Maryann Conners, CNM 02/02/2021 10:48 AM

## 2021-02-03 DIAGNOSIS — Z905 Acquired absence of kidney: Secondary | ICD-10-CM | POA: Insufficient documentation

## 2021-02-03 MED ORDER — MAGNESIUM 400 MG PO TABS: 1.0000 | ORAL_TABLET | Freq: Every day | ORAL | 2 refills | Status: DC

## 2021-02-04 LAB — CULTURE, OB URINE

## 2021-02-04 LAB — URINE CULTURE, OB REFLEX

## 2021-02-05 LAB — URINE CYTOLOGY ANCILLARY ONLY
Chlamydia: NEGATIVE
Comment: NEGATIVE
Comment: NEGATIVE
Comment: NORMAL
Neisseria Gonorrhea: NEGATIVE
Trichomonas: NEGATIVE

## 2021-02-06 ENCOUNTER — Other Ambulatory Visit (HOSPITAL_COMMUNITY): Payer: Self-pay

## 2021-02-06 ENCOUNTER — Other Ambulatory Visit: Payer: Self-pay

## 2021-02-06 ENCOUNTER — Ambulatory Visit (INDEPENDENT_AMBULATORY_CARE_PROVIDER_SITE_OTHER): Payer: 59

## 2021-02-06 ENCOUNTER — Ambulatory Visit
Admission: RE | Admit: 2021-02-06 | Discharge: 2021-02-06 | Disposition: A | Discharge status: Home / Self Care | Payer: 59 | Source: Ambulatory Visit

## 2021-02-06 DIAGNOSIS — O0990 Supervision of high risk pregnancy, unspecified, unspecified trimester: Secondary | ICD-10-CM

## 2021-02-06 DIAGNOSIS — Z3687 Encounter for antenatal screening for uncertain dates: Secondary | ICD-10-CM

## 2021-02-06 DIAGNOSIS — O219 Vomiting of pregnancy, unspecified: Secondary | ICD-10-CM

## 2021-02-06 NOTE — Progress Notes (Signed)
Pt here for U/S results reviewed with Dr. Elly Modena, today Pt is [redacted]w[redacted]d, due date 08/17/21. Pt was advised to start Prenatal vitamins as soon as possible and to start Prenatal Care at a Office that cares for High Risk Pt's due to [facility]. Pt verbalized understanding.

## 2021-02-06 NOTE — Progress Notes (Signed)
Patient was assessed and managed by nursing staff during this encounter. I have reviewed the chart and agree with the documentation and plan. I have also made any necessary editorial changes.  Mora Bellman, MD 02/06/2021 11:32 AM

## 2021-02-09 LAB — HEMOGLOBIN A1C
Est. average glucose Bld gHb Est-mCnc: 94 mg/dL
Hgb A1c MFr Bld: 4.9 % (ref 4.8–5.6)

## 2021-02-09 LAB — OBSTETRIC PANEL, INCLUDING HIV
Antibody Screen: NEGATIVE
Basophils Absolute: 0 10*3/uL (ref 0.0–0.2)
Basos: 1 %
EOS (ABSOLUTE): 0.1 10*3/uL (ref 0.0–0.4)
Eos: 2 %
HIV Screen 4th Generation wRfx: NONREACTIVE
Hematocrit: 35.5 % (ref 34.0–46.6)
Hemoglobin: 12.3 g/dL (ref 11.1–15.9)
Hepatitis B Surface Ag: NEGATIVE
Immature Grans (Abs): 0 10*3/uL (ref 0.0–0.1)
Immature Granulocytes: 0 %
Lymphocytes Absolute: 1 10*3/uL (ref 0.7–3.1)
Lymphs: 18 %
MCH: 30.7 pg (ref 26.6–33.0)
MCHC: 34.6 g/dL (ref 31.5–35.7)
MCV: 89 fL (ref 79–97)
Monocytes Absolute: 0.5 10*3/uL (ref 0.1–0.9)
Monocytes: 8 %
Neutrophils Absolute: 3.9 10*3/uL (ref 1.4–7.0)
Neutrophils: 71 %
Platelets: 264 10*3/uL (ref 150–450)
RBC: 4.01 x10E6/uL (ref 3.77–5.28)
RDW: 12.5 % (ref 11.7–15.4)
RPR Ser Ql: NONREACTIVE
Rh Factor: POSITIVE
Rubella Antibodies, IGG: 4.25 index (ref >0.99)
WBC: 5.5 10*3/uL (ref 3.4–10.8)

## 2021-02-09 LAB — COMPREHENSIVE METABOLIC PANEL
ALT: 11 IU/L (ref 0–32)
AST: 18 IU/L (ref 0–40)
Albumin/Globulin Ratio: 1.7 (ref 1.2–2.2)
Albumin: 4.6 g/dL (ref 3.8–4.8)
Alkaline Phosphatase: 47 IU/L (ref 44–121)
BUN/Creatinine Ratio: 11 (ref 9–23)
BUN: 9 mg/dL (ref 6–24)
Bilirubin Total: 0.3 mg/dL (ref 0.0–1.2)
CO2: 20 mmol/L (ref 20–29)
Calcium: 9.4 mg/dL (ref 8.7–10.2)
Chloride: 102 mmol/L (ref 96–106)
Creatinine, Ser: 0.8 mg/dL (ref 0.57–1.00)
Globulin, Total: 2.7 g/dL (ref 1.5–4.5)
Glucose: 85 mg/dL (ref 70–99)
Potassium: 4.2 mmol/L (ref 3.5–5.2)
Sodium: 137 mmol/L (ref 134–144)
Total Protein: 7.3 g/dL (ref 6.0–8.5)
eGFR: 95 mL/min/{1.73_m2} (ref >59)

## 2021-02-09 LAB — SMN1 COPY NUMBER ANALYSIS (SMA CARRIER SCREENING)

## 2021-02-09 LAB — PROTEIN / CREATININE RATIO, URINE
Creatinine, Urine: 185.8 mg/dL
Protein, Ur: 19.4 mg/dL
Protein/Creat Ratio: 104 mg/g creat (ref 0–200)

## 2021-02-09 LAB — HEPATITIS C ANTIBODY: Hep C Virus Ab: <0.1 s/co ratio (ref 0.0–0.9)

## 2021-03-07 ENCOUNTER — Ambulatory Visit (INDEPENDENT_AMBULATORY_CARE_PROVIDER_SITE_OTHER): Payer: 59 | Admitting: Certified Nurse Midwife

## 2021-03-07 ENCOUNTER — Other Ambulatory Visit: Payer: Self-pay

## 2021-03-07 VITALS — BP 124/77 | HR 82

## 2021-03-07 DIAGNOSIS — Z905 Acquired absence of kidney: Secondary | ICD-10-CM

## 2021-03-07 DIAGNOSIS — O99612 Diseases of the digestive system complicating pregnancy, second trimester: Secondary | ICD-10-CM

## 2021-03-07 DIAGNOSIS — O0992 Supervision of high risk pregnancy, unspecified, second trimester: Secondary | ICD-10-CM

## 2021-03-07 DIAGNOSIS — Z3A16 16 weeks gestation of pregnancy: Secondary | ICD-10-CM

## 2021-03-07 DIAGNOSIS — K219 Gastro-esophageal reflux disease without esophagitis: Secondary | ICD-10-CM

## 2021-03-07 MED ORDER — FAMOTIDINE 20 MG PO TABS: 20.0000 mg | ORAL_TABLET | Freq: Two times a day (BID) | ORAL | 3 refills | Status: DC

## 2021-03-07 MED ORDER — ONDANSETRON 4 MG PO TBDP: 4.0000 mg | ORAL_TABLET | Freq: Three times a day (TID) | ORAL | 3 refills | Status: DC | PRN

## 2021-03-08 NOTE — Progress Notes (Signed)
PRENATAL VISIT NOTE  Subjective:  [name] [name] is a 40 y.o. [id number] at 110w6d being seen today for ongoing prenatal care.  She is currently monitored for the following issues for this high-risk pregnancy and has Migraine headache; Fibroids; Uterine fibroid in pregnancy; History of pre-eclampsia in prior pregnancy, currently pregnant; Penicillin allergy; Chronic migraine; Nightmares; History of depression; Postpartum anxiety; Renal mass; Supervision of high risk pregnancy, antepartum; Nausea and vomiting of pregnancy, antepartum; and History of right radical nephrectomy on their problem list.  Patient reports heartburn, nausea, vomiting, and pelvic pain mostly on the lower right side of her uterus. At times sharp with movement, often a dull ache . Is not feeling much movement which concerns her, and is starting to feel a little anxious because she lost a baby between 16-18 weeks in one of her pregnancies.  Contractions: Not present. Vag. Bleeding: None.  Movement: Absent. Denies leaking of fluid.   The following portions of the patient's history were reviewed and updated as appropriate: allergies, current medications, past family history, past medical history, past social history, past surgical history and problem list.   Objective:   Vitals:   03/07/21 1107  BP: 124/77  Pulse: 82   Fetal Status: Fetal Heart Rate (bpm): 154   Movement: Absent     General:  Alert, oriented and cooperative. Patient is in no acute distress.  Skin: Skin is warm and dry. No rash noted.   Cardiovascular: Normal heart rate noted  Respiratory: Normal respiratory effort, no problems with respiration noted  Abdomen: Soft, gravid, appropriate for gestational age. Some mild tenderness over lower right quadrant of abdomen. Pain/Pressure: Present     Pelvic: Cervical exam deferred        Extremities: Normal range of motion.  Edema: None  Mental Status: Normal mood and affect. Normal behavior. Normal judgment and  thought content.   Assessment and Plan:  Pregnancy: [id number] at [redacted]w[redacted]d 1. Supervision of high risk pregnancy in second trimester - Pt will transfer to [facility] for management of high risk pregnancy, will continue to see me at that office as well as MDs when needed. - U/S reviewed, 3.6cm anterior fibroid noted, likely where mild tenderness was noted. Anterior placenta also seen on ultrasound. Reassured pt that pain may be due to growing fibroid, inability to feel movement likely from placement of fibroid and placenta.  - Advised to present to MAU if she notes bleeding or severe pain - [place] MFM OB DETAIL +14 WK; Future  2. [redacted] weeks gestation of pregnancy - Routine OB care  - Genetic Screening - AFP, Serum, Open Spina Bifida  3. Gastroesophageal reflux in pregnancy in second trimester - Nausea triggered mostly by empty stomach or certain foods - famotidine (PEPCID) 20 MG tablet; Take 1 tablet (20 mg total) by mouth 2 (two) times daily.  Dispense: 60 tablet; Refill: 3  4. History of right radical nephrectomy - Has not spoken to her nephrology team about the pregnancy yet, advised to do so as soon as possible so we can read their note and follow up as they advise. Pt expressed understanding.  Preterm labor symptoms and general obstetric precautions including but not limited to vaginal bleeding, contractions, leaking of fluid and fetal movement were reviewed in detail with the patient. Please refer to After Visit Summary for other counseling recommendations.   Return in about 4 weeks (around 04/04/2021) for IN-PERSON, Eugenio Saenz.  Future Appointments  Date Time Provider [place]  03/13/2021  9:50 AM [facility] NURSE  [facility] None  03/20/2021 10:10 AM [facility] NURSE [facility] None  03/27/2021  9:10 AM [facility] NURSE [facility] None  04/03/2021  2:00 PM [facility] NURSE [facility]  04/03/2021  2:15 PM [facility] US2 [facility]  04/11/2021  8:35 AM Gilford Rile, Cristy Folks, CNM [facility]     Gabriel Carina, CNM

## 2021-03-09 LAB — AFP, SERUM, OPEN SPINA BIFIDA
AFP MoM: 1.2
AFP Value: 45.6 ng/mL
Gest. Age on Collection Date: 16 weeks
Maternal Age At EDD: 41 yr
OSBR Risk 1 IN: 10000
Test Results:: NEGATIVE
Weight: 153 [lb_av]

## 2021-03-13 ENCOUNTER — Other Ambulatory Visit: Payer: Self-pay

## 2021-03-13 ENCOUNTER — Ambulatory Visit (INDEPENDENT_AMBULATORY_CARE_PROVIDER_SITE_OTHER): Payer: 59 | Admitting: *Deleted

## 2021-03-13 VITALS — BP 117/64 | HR 73 | Temp 98.0°F | Wt 152.0 lb

## 2021-03-13 DIAGNOSIS — O0990 Supervision of high risk pregnancy, unspecified, unspecified trimester: Secondary | ICD-10-CM

## 2021-03-13 NOTE — Progress Notes (Signed)
° °  Patient in clinic for fetal heart tone. FHT today was 155. Patient is still unable to feel fetal movement. Patient to return next week for repeat FHT.  Today's Vitals   03/13/21 1012  BP: 117/64  Pulse: 73  Temp: 98 F (36.7 C)  TempSrc: Oral  Weight: 152 lb (68.9 kg)   Body mass index is 28.72 kg/m.   Derl Barrow, RN

## 2021-03-18 NOTE — L&D Delivery Note (Addendum)
Vaginal Delivery Note ? ?Pre-procedure Diagnosis: SIUP @ [redacted]w[redacted]d?Indications: 41y.o. [id number] Date of Delivery: 08/17/21 here today for mIOL. Her pregnancy has been complicated by gHTN, hx of renal carcinoma with R sided nephrectomy, hx of pre-eclampsia. Her labor course was complicated by intrapartum conversion to PreE  w SF (Pcr, HA, blurry VF) requiring mag.   ? ?Post-procedure Diagnosis: SIUP @ 3100w0d same ? ?Provider: OnGreggory KeenSNM; SaDarrelyn HillockO ? ?Anesthesia: epidural ? ?Complications: none ? ?Delivery Estimated Blood Loss (EBL): 90  mL ? ?Transfusions: none ? ?Pathology: placenta to l&d routinely ? ?Labor Events: ?Rupture date: 07/27/2021 , at 9:45 PM .  ?Rupture type: Artificial [2];Intact [6];Bulging bag of water [8]  ?Fluid characteristic: Clear [1];Pink [5]  ?Interval from ROM to Delivery: 0h 37110mnduction:  cytotec and FB  ?Augmentation:  AROM  ?Sex: M ? ?Delivery Information:  ?Time of Birth: 10:22 PM  ?Baby Weight: pending  ? ?APGARS One minute Five minutes ?Totals:   8  9 ?Newborn is AGA and well following initial NICU eval. Patient plans to breastfeed.   ? ?Procedure:  ?Called to beside for severe abd pain d/t complete dilation. The patient was placed in lithotomy position, draped under in a routine fashion.   ?SVD of a viable female infant in cephalic R OT (leaning towards OP) presentation delivered spontaneously over an intact supported perineum. No nuchal cord. No dystocia. No meconium present. Nose and mouth suctioned with bulb; cord clamped and cut. The placenta was then delivered spontaneously and intact via CCT; SchDelena BaliVCLongview Regional Medical Centerhe uterine fundus was firm after uterine massage and with a 500 ml bolus of 20 units of pitocin. Inspection revealed an intact perineum. Patient tolerated the delivery well. Instrument, sponge, and needle counts were correct at the end of the procedure. ? ? ?[facility] ?Electronically Signed By: ?OnoGreggory KeenNM ?07/27/21  11:05 PM   ? ?ATTESTATION ? ?I was present, gloved, and supervising throughout the delivery and agree with above documentation in the CNM student's note. ? ?SamPatriciaann ClanO ?OB Fellow  ?[facility] manager5/13/2023, 2:10 AM   ?

## 2021-03-20 ENCOUNTER — Other Ambulatory Visit: Payer: Self-pay

## 2021-03-20 ENCOUNTER — Ambulatory Visit (INDEPENDENT_AMBULATORY_CARE_PROVIDER_SITE_OTHER): Payer: Medicaid Other | Admitting: *Deleted

## 2021-03-20 VITALS — BP 122/65 | HR 93 | Temp 98.4°F | Ht 61.0 in | Wt 157.6 lb

## 2021-03-20 DIAGNOSIS — O0990 Supervision of high risk pregnancy, unspecified, unspecified trimester: Secondary | ICD-10-CM

## 2021-03-20 DIAGNOSIS — Z3492 Encounter for supervision of normal pregnancy, unspecified, second trimester: Secondary | ICD-10-CM

## 2021-03-20 NOTE — Progress Notes (Signed)
° °  Patient in clinic for fetal heart tone check. FHT today was 161. Patient reported she felt some fetal movement a couple of days ago. Patient to return next week for repeat FHT until anatomy scan on 04/03/2021.  Derl Barrow, RN

## 2021-03-27 ENCOUNTER — Other Ambulatory Visit: Payer: Self-pay

## 2021-03-27 ENCOUNTER — Ambulatory Visit (INDEPENDENT_AMBULATORY_CARE_PROVIDER_SITE_OTHER): Payer: 59 | Admitting: *Deleted

## 2021-03-27 VITALS — BP 107/68 | HR 66 | Temp 98.8°F | Ht 61.0 in | Wt 156.2 lb

## 2021-03-27 DIAGNOSIS — O219 Vomiting of pregnancy, unspecified: Secondary | ICD-10-CM

## 2021-03-27 DIAGNOSIS — O0990 Supervision of high risk pregnancy, unspecified, unspecified trimester: Secondary | ICD-10-CM

## 2021-03-27 NOTE — Progress Notes (Signed)
° °  Patient in clinic for fetal heart tone check. FHT today was 150. Patient reported she is feeling a little more fetal movement, but not everyday. Patient is scheduled for ultrasound scan on 04/03/2021.  Derl Barrow, RN

## 2021-03-28 ENCOUNTER — Ambulatory Visit (INDEPENDENT_AMBULATORY_CARE_PROVIDER_SITE_OTHER): Payer: 59 | Admitting: Licensed Clinical Social Worker

## 2021-03-28 ENCOUNTER — Other Ambulatory Visit: Payer: Self-pay | Admitting: Certified Nurse Midwife

## 2021-03-28 DIAGNOSIS — O99340 Other mental disorders complicating pregnancy, unspecified trimester: Secondary | ICD-10-CM | POA: Diagnosis not present

## 2021-03-28 DIAGNOSIS — Z658 Other specified problems related to psychosocial circumstances: Secondary | ICD-10-CM

## 2021-03-28 DIAGNOSIS — F32A Depression, unspecified: Secondary | ICD-10-CM

## 2021-03-28 DIAGNOSIS — F419 Anxiety disorder, unspecified: Secondary | ICD-10-CM

## 2021-03-28 MED ORDER — SERTRALINE HCL 25 MG PO TABS
25.0000 mg | ORAL_TABLET | Freq: Every day | ORAL | 11 refills | Status: AC
Start: 2021-03-28 — End: ?

## 2021-03-28 NOTE — Progress Notes (Signed)
SW spoke with patient today while patient was in a state of very high anxiety (see her note), and pt requested to be started back on zoloft. I spoke with patient after her [place] appt - pt reported a sharp increase in anxiety lately, feeling foggy, frequent crying episodes and overall feeling very detached. Pt has a history of anxiety with depression and adult ADHD. Cannot remember what dose of zoloft she was on, will start at 25mg  and titrate up as needed. Advised pt to keep in close contact as the zoloft takes effect and report any signs of mania/SI. Pt expressed understanding and agreed to plan.   Gaylan Gerold, CNM, MSN, [place] Certified Nurse Midwife, [facility]

## 2021-03-28 NOTE — BH Specialist Note (Signed)
Integrated Behavioral Health via Telemedicine Visit  03/28/2021 [name] [name] [id number]  Number of [place] visits: 1 Session Start time: 1015am  Session End time: 1110am Total time: 55  mins via mychart video   Referring Provider: Candiss Norse RN Patient/Family location: Home  [facility] Provider location: [place]  All persons participating in visit: Pt [name] [name] and LCSW [name] [name]  Types of Service: Individual psychotherapy and Video visit  I connected with [name] [name] and/or [name] [name]'s n/a via  Telephone or Geologist, engineering  (Video is Tree surgeon) and verified that I am speaking with the correct person using two identifiers. Discussed confidentiality: Yes   I discussed the limitations of telemedicine and the availability of in person appointments.  Discussed there is a possibility of technology failure and discussed alternative modes of communication if that failure occurs.  I discussed that engaging in this telemedicine visit, they consent to the provision of behavioral healthcare and the services will be billed under their insurance.  Patient and/or legal guardian expressed understanding and consented to Telemedicine visit: Yes   Presenting Concerns: Patient and/or family reports the following symptoms/concerns: Major depression disorder/ADHD  Duration of problem: over one year ; Severity of problem: mild  Patient and/or Family's Strengths/Protective Factors: Concrete supports in place (healthy food, safe environments, etc.)  Goals Addressed: Patient will:  Reduce symptoms of: depression, mood instability, and stress   Increase knowledge and/or ability of: coping skills and stress reduction   Demonstrate ability to: Increase healthy adjustment to current life circumstances  Progress towards Goals: Ongoing  Interventions: Interventions utilized:  Mindfulness or Psychologist, educational, Supportive Counseling, and Sleep  Hygiene Standardized Assessments completed: PHQ 9  Patient and/or Family Response: [name] [name] was tearful and overwhelmed during appt   Assessment: Patient currently experiencing depression affecting pregnancy.   Patient may benefit from integrated behavioral health .  Plan: Follow up with behavioral health clinician on : 2 weeks  Behavioral recommendations: Keep medical appts, prioritize rest and self care. LCSW [name] [name] stressed importance of time alone and self care to prevent burnout and reduce stress.  Referral(s): [place] (In Clinic)  I discussed the assessment and treatment plan with the patient and/or parent/guardian. They were provided an opportunity to ask questions and all were answered. They agreed with the plan and demonstrated an understanding of the instructions.   They were advised to call back or seek an in-person evaluation if the symptoms worsen or if the condition fails to improve as anticipated.  Lynnea Ferrier, LCSW

## 2021-04-03 ENCOUNTER — Ambulatory Visit: Payer: 59 | Attending: Certified Nurse Midwife

## 2021-04-03 ENCOUNTER — Ambulatory Visit: Payer: 59 | Admitting: *Deleted

## 2021-04-03 ENCOUNTER — Other Ambulatory Visit: Payer: Self-pay

## 2021-04-03 VITALS — BP 132/71 | HR 59

## 2021-04-03 DIAGNOSIS — O0990 Supervision of high risk pregnancy, unspecified, unspecified trimester: Secondary | ICD-10-CM | POA: Insufficient documentation

## 2021-04-03 DIAGNOSIS — O219 Vomiting of pregnancy, unspecified: Secondary | ICD-10-CM

## 2021-04-03 DIAGNOSIS — O0992 Supervision of high risk pregnancy, unspecified, second trimester: Secondary | ICD-10-CM | POA: Diagnosis present

## 2021-04-04 ENCOUNTER — Other Ambulatory Visit: Payer: Self-pay | Admitting: *Deleted

## 2021-04-04 ENCOUNTER — Encounter: Payer: 59 | Admitting: Advanced Practice Midwife

## 2021-04-04 DIAGNOSIS — Z362 Encounter for other antenatal screening follow-up: Secondary | ICD-10-CM

## 2021-04-11 ENCOUNTER — Ambulatory Visit (INDEPENDENT_AMBULATORY_CARE_PROVIDER_SITE_OTHER): Payer: 59 | Admitting: Certified Nurse Midwife

## 2021-04-11 ENCOUNTER — Other Ambulatory Visit: Payer: Self-pay

## 2021-04-11 ENCOUNTER — Encounter: Payer: Self-pay | Admitting: Certified Nurse Midwife

## 2021-04-11 VITALS — BP 121/69 | HR 62 | Wt 157.0 lb

## 2021-04-11 DIAGNOSIS — O0993 Supervision of high risk pregnancy, unspecified, third trimester: Secondary | ICD-10-CM

## 2021-04-11 DIAGNOSIS — O99340 Other mental disorders complicating pregnancy, unspecified trimester: Secondary | ICD-10-CM

## 2021-04-11 DIAGNOSIS — F419 Anxiety disorder, unspecified: Secondary | ICD-10-CM

## 2021-04-11 DIAGNOSIS — Z3A21 21 weeks gestation of pregnancy: Secondary | ICD-10-CM

## 2021-04-11 DIAGNOSIS — Z905 Acquired absence of kidney: Secondary | ICD-10-CM

## 2021-04-11 LAB — POCT URINALYSIS DIP (DEVICE)
Bilirubin Urine: NEGATIVE
Glucose, UA: NEGATIVE mg/dL
Ketones, ur: NEGATIVE mg/dL
Leukocytes,Ua: NEGATIVE
Nitrite: NEGATIVE
Protein, ur: NEGATIVE mg/dL
Specific Gravity, Urine: 1.025 (ref 1.005–1.030)
Urobilinogen, UA: 0.2 mg/dL (ref 0.0–1.0)
pH: 7.5 (ref 5.0–8.0)

## 2021-04-12 ENCOUNTER — Other Ambulatory Visit: Payer: Self-pay

## 2021-04-12 DIAGNOSIS — O219 Vomiting of pregnancy, unspecified: Secondary | ICD-10-CM

## 2021-04-12 LAB — URINALYSIS, ROUTINE W REFLEX MICROSCOPIC
Bilirubin, UA: NEGATIVE
Glucose, UA: NEGATIVE
Ketones, UA: NEGATIVE
Nitrite, UA: NEGATIVE
Specific Gravity, UA: 1.02 (ref 1.005–1.030)
Urobilinogen, Ur: 0.2 mg/dL (ref 0.2–1.0)
pH, UA: 8 — ABNORMAL HIGH (ref 5.0–7.5)

## 2021-04-12 LAB — MICROSCOPIC EXAMINATION
Casts: NONE SEEN /lpf
Epithelial Cells (non renal): >10 /hpf — AB (ref 0–10)
RBC, Urine: NONE SEEN /hpf (ref 0–2)
WBC, UA: NONE SEEN /hpf (ref 0–5)

## 2021-04-12 LAB — PROTEIN / CREATININE RATIO, URINE
Creatinine, Urine: 155.6 mg/dL
Protein, Ur: 15.3 mg/dL
Protein/Creat Ratio: 98 mg/g creat (ref 0–200)

## 2021-04-12 MED ORDER — PROMETHAZINE HCL 25 MG PO TABS: 25.0000 mg | ORAL_TABLET | Freq: Four times a day (QID) | ORAL | 0 refills | Status: DC | PRN

## 2021-04-12 NOTE — Progress Notes (Addendum)
° °  PRENATAL VISIT NOTE  Subjective:  [name] [name] is a 41 y.o. [id number] at [redacted]w[redacted]d being seen today for ongoing prenatal care.  She is currently monitored for the following issues for this high-risk pregnancy and has Migraine headache; Fibroids; Uterine fibroid in pregnancy; History of pre-eclampsia in prior pregnancy, currently pregnant; Penicillin allergy; Chronic migraine; Nightmares; History of depression; Postpartum anxiety; Renal mass; Supervision of high risk pregnancy, antepartum; Nausea and vomiting of pregnancy, antepartum; History of right radical nephrectomy; and History of renal cell carcinoma on their problem list.  Patient reports no complaints, feeling much better since starting Zoloft and having ultrasound.  Contractions: Not present. Vag. Bleeding: None.  Movement: Present. Denies leaking of fluid.   The following portions of the patient's history were reviewed and updated as appropriate: allergies, current medications, past family history, past medical history, past social history, past surgical history and problem list.   Objective:   Vitals:   04/11/21 0848  BP: 121/69  Pulse: 62  Weight: 157 lb (71.2 kg)   Fetal Status: Fetal Heart Rate (bpm): 148   Movement: Present     General:  Alert, oriented and cooperative. Patient is in no acute distress.  Skin: Skin is warm and dry. No rash noted.   Cardiovascular: Normal heart rate noted  Respiratory: Normal respiratory effort, no problems with respiration noted  Abdomen: Soft, gravid, appropriate for gestational age.  Pain/Pressure: Absent     Pelvic: Cervical exam deferred        Extremities: Normal range of motion.  Edema: None  Mental Status: Normal mood and affect. Normal behavior. Normal judgment and thought content.   Assessment and Plan:  Pregnancy: [id number] at [redacted]w[redacted]d 1. Supervision of high risk pregnancy in third trimester - Doing well, feeling regular and vigorous fetal movement   2. [redacted] weeks gestation of  pregnancy - Routine OB care, reviewed ultrasound report  3. History of right radical nephrectomy - Since anxiety flare happened between visits, she has not yet seen her nephrologist. Strongly encouraged to do so.  - Spoke to Dr. Ilda Basset about additional testing who recommended a UA/P:Cr with 24hr urine if needed and to be on daily baby aspirin (reminded pt to take daily). Will get UA at each visit, increase testing as needed. - Protein / creatinine ratio, urine - Urinalysis, Routine w reflex microscopic  4. Anxiety during pregnancy - Becoming more stable on zoloft, seeing Lynnea Ferrier regularly.  Preterm labor symptoms and general obstetric precautions including but not limited to vaginal bleeding, contractions, leaking of fluid and fetal movement were reviewed in detail with the patient. Please refer to After Visit Summary for other counseling recommendations.   Return in about 4 weeks (around 05/09/2021) for IN-PERSON, HOB.  Future Appointments  Date Time Provider [place]  05/04/2021 10:45 AM [facility] NURSE [facility]  05/04/2021 11:00 AM [facility] US1 [facility]  05/09/2021  8:35 AM Gabriel Carina, CNM [facility]    Gabriel Carina, CNM

## 2021-04-12 NOTE — Telephone Encounter (Signed)
Pt request for medication for nausea as her insurance does not cover Zofran.  Phenergan ordered per standing protocol.    [name]  04/12/21

## 2021-05-01 ENCOUNTER — Telehealth: Payer: Self-pay | Admitting: *Deleted

## 2021-05-01 NOTE — Telephone Encounter (Signed)
Received a voice message from a Desiree from [facility] stating they received a prior auth for Zofran and they need to know if her diagnosis is Hyperemesis Ask for a call back to [phone] case [id number]. I called and gave required information  that she does not have diagnosis of hyperemesis but does have nausea and vomiting of pregnancy.  Emslee Lopezmartinez,RN

## 2021-05-02 ENCOUNTER — Encounter: Payer: 59 | Admitting: Certified Nurse Midwife

## 2021-05-04 ENCOUNTER — Ambulatory Visit: Payer: 59 | Attending: Obstetrics and Gynecology

## 2021-05-04 ENCOUNTER — Encounter: Payer: Self-pay | Admitting: *Deleted

## 2021-05-04 ENCOUNTER — Other Ambulatory Visit: Payer: Self-pay | Admitting: *Deleted

## 2021-05-04 ENCOUNTER — Ambulatory Visit: Payer: 59 | Admitting: *Deleted

## 2021-05-04 ENCOUNTER — Other Ambulatory Visit: Payer: Self-pay

## 2021-05-04 VITALS — BP 126/62 | HR 64

## 2021-05-04 DIAGNOSIS — O0990 Supervision of high risk pregnancy, unspecified, unspecified trimester: Secondary | ICD-10-CM | POA: Diagnosis present

## 2021-05-04 DIAGNOSIS — O219 Vomiting of pregnancy, unspecified: Secondary | ICD-10-CM

## 2021-05-04 DIAGNOSIS — Z362 Encounter for other antenatal screening follow-up: Secondary | ICD-10-CM | POA: Diagnosis not present

## 2021-05-04 DIAGNOSIS — O09522 Supervision of elderly multigravida, second trimester: Secondary | ICD-10-CM

## 2021-05-04 DIAGNOSIS — O283 Abnormal ultrasonic finding on antenatal screening of mother: Secondary | ICD-10-CM

## 2021-05-04 DIAGNOSIS — Z3A25 25 weeks gestation of pregnancy: Secondary | ICD-10-CM

## 2021-05-07 ENCOUNTER — Telehealth: Payer: Self-pay | Admitting: Lactation Services

## 2021-05-07 NOTE — Telephone Encounter (Signed)
Received a denial on PA for Zofran. [employer] and was informed that no PA is needed if filling >/= 21 tablets a month.   Called patient to review need for medication and inform them of what [facility] indicates. She did not answer. LM for her to check her Mychart message.

## 2021-05-09 ENCOUNTER — Ambulatory Visit (INDEPENDENT_AMBULATORY_CARE_PROVIDER_SITE_OTHER): Payer: 59 | Admitting: Certified Nurse Midwife

## 2021-05-09 ENCOUNTER — Other Ambulatory Visit: Payer: Self-pay

## 2021-05-09 VITALS — BP 128/68 | HR 66 | Wt 160.0 lb

## 2021-05-09 DIAGNOSIS — O0992 Supervision of high risk pregnancy, unspecified, second trimester: Secondary | ICD-10-CM

## 2021-05-09 DIAGNOSIS — Z3A25 25 weeks gestation of pregnancy: Secondary | ICD-10-CM

## 2021-05-09 DIAGNOSIS — Z905 Acquired absence of kidney: Secondary | ICD-10-CM

## 2021-05-09 LAB — COMPREHENSIVE METABOLIC PANEL
ALT: 12 IU/L (ref 0–32)
AST: 15 IU/L (ref 0–40)
Albumin/Globulin Ratio: 1.5 (ref 1.2–2.2)
Albumin: 4 g/dL (ref 3.8–4.8)
Alkaline Phosphatase: 51 IU/L (ref 44–121)
BUN/Creatinine Ratio: 11 (ref 9–23)
BUN: 9 mg/dL (ref 6–24)
Bilirubin Total: <0.2 mg/dL (ref 0.0–1.2)
CO2: 20 mmol/L (ref 20–29)
Calcium: 9 mg/dL (ref 8.7–10.2)
Chloride: 102 mmol/L (ref 96–106)
Creatinine, Ser: 0.82 mg/dL (ref 0.57–1.00)
Globulin, Total: 2.6 g/dL (ref 1.5–4.5)
Glucose: 69 mg/dL — ABNORMAL LOW (ref 70–99)
Potassium: 4.1 mmol/L (ref 3.5–5.2)
Sodium: 135 mmol/L (ref 134–144)
Total Protein: 6.6 g/dL (ref 6.0–8.5)
eGFR: 93 mL/min/{1.73_m2} (ref >59)

## 2021-05-09 NOTE — Progress Notes (Signed)
° °  PRENATAL VISIT NOTE  Subjective:  [name] [name] is a 41 y.o. [id number] at [redacted]w[redacted]d being seen today for ongoing prenatal care.  She is currently monitored for the following issues for this high-risk pregnancy and has Migraine headache; Fibroids; Uterine fibroid in pregnancy; History of pre-eclampsia in prior pregnancy, currently pregnant; Penicillin allergy; Chronic migraine; Nightmares; History of depression; Postpartum anxiety; Renal mass; Supervision of high risk pregnancy, antepartum; Nausea and vomiting of pregnancy, antepartum; History of right radical nephrectomy; and History of renal cell carcinoma on their problem list. Patient reports  still having some nausea, mostly in the morning and bad reflux. Otherwise doing well. Has questions about "small baby" from ultrasound. Feels good on current Zoloft dose.   .  Contractions: Not present. Vag. Bleeding: None.  Movement: Present. Denies leaking of fluid.   The following portions of the patient's history were reviewed and updated as appropriate: allergies, current medications, past family history, past medical history, past social history, past surgical history and problem list.   Objective:   Vitals:   05/09/21 0839  BP: 128/68  Pulse: 66  Weight: 160 lb (72.6 kg)    Fetal Status: Fetal Heart Rate (bpm): 140 Fundal Height: 24 cm Movement: Present     General:  Alert, oriented and cooperative. Patient is in no acute distress.  Skin: Skin is warm and dry. No rash noted.   Cardiovascular: Normal heart rate noted  Respiratory: Normal respiratory effort, no problems with respiration noted  Abdomen: Soft, gravid, appropriate for gestational age.  Pain/Pressure: Absent     Pelvic: Cervical exam deferred        Extremities: Normal range of motion.  Edema: Trace  Mental Status: Normal mood and affect. Normal behavior. Normal judgment and thought content.   Assessment and Plan:  Pregnancy: [id number] at [redacted]w[redacted]d 1. Supervision of high risk  pregnancy in second trimester - Doing well, feeling regular and vigorous fetal movement   2. [redacted] weeks gestation of pregnancy - Routine OB care including anticipatory guidance re GTT at next visit.  3. History of right radical nephrectomy - Called nephrologist and was told she did not need to be seen by them if not having kidney problems. Has been staying very hydrated, never holding urine, etc. Will continue with monthly kidney surveillance and refer to in-network nephrologist if needed. - Comp Met (CMET) - Protein / creatinine ratio, urine  Preterm labor symptoms and general obstetric precautions including but not limited to vaginal bleeding, contractions, leaking of fluid and fetal movement were reviewed in detail with the patient. Please refer to After Visit Summary for other counseling recommendations.   Return in about 2 weeks (around 05/23/2021) for HOB/GTT.  Future Appointments  Date Time Provider [place]  05/23/2021  8:50 AM [facility] LAB [facility]  05/23/2021  9:35 AM Radene Gunning, MD [facility]  06/01/2021 11:00 AM [facility] NURSE [facility]  06/01/2021 11:15 AM [facility] US2 [facility] [place]    Gabriel Carina, CNM

## 2021-05-09 NOTE — Patient Instructions (Signed)
Must be fasting from midnight the night before your next visit. Take a zofran the morning of your glucose tolerance test.

## 2021-05-09 NOTE — Progress Notes (Signed)
Patient is here for routine prenatal care. She stated that baby is moving well and denies any pain, vaginal bleeding or abnormal vaginal discharge. [name] [name] is aware of upcoming ultrasound scheduled for June 01, 2021 @ 11:15am.  No further questions or concerns   [name] [name], [place]   05/09/21

## 2021-05-10 LAB — PROTEIN / CREATININE RATIO, URINE
Creatinine, Urine: 70.7 mg/dL
Protein, Ur: 9.5 mg/dL
Protein/Creat Ratio: 134 mg/g creat (ref 0–200)

## 2021-05-17 ENCOUNTER — Emergency Department (HOSPITAL_BASED_OUTPATIENT_CLINIC_OR_DEPARTMENT_OTHER): Payer: 59

## 2021-05-17 ENCOUNTER — Encounter (HOSPITAL_BASED_OUTPATIENT_CLINIC_OR_DEPARTMENT_OTHER): Payer: Self-pay | Admitting: *Deleted

## 2021-05-17 ENCOUNTER — Other Ambulatory Visit: Payer: Self-pay

## 2021-05-17 ENCOUNTER — Other Ambulatory Visit: Payer: Self-pay | Admitting: General Practice

## 2021-05-17 ENCOUNTER — Emergency Department (HOSPITAL_BASED_OUTPATIENT_CLINIC_OR_DEPARTMENT_OTHER)
Admission: EM | Admit: 2021-05-17 | Discharge: 2021-05-17 | Disposition: A | Discharge status: Home / Self Care | Payer: 59 | Attending: Emergency Medicine | Admitting: Emergency Medicine

## 2021-05-17 DIAGNOSIS — F17210 Nicotine dependence, cigarettes, uncomplicated: Secondary | ICD-10-CM | POA: Insufficient documentation

## 2021-05-17 DIAGNOSIS — Z20822 Contact with and (suspected) exposure to covid-19: Secondary | ICD-10-CM | POA: Diagnosis not present

## 2021-05-17 DIAGNOSIS — R0789 Other chest pain: Secondary | ICD-10-CM | POA: Insufficient documentation

## 2021-05-17 DIAGNOSIS — R0602 Shortness of breath: Secondary | ICD-10-CM | POA: Diagnosis not present

## 2021-05-17 DIAGNOSIS — O99332 Smoking (tobacco) complicating pregnancy, second trimester: Secondary | ICD-10-CM | POA: Insufficient documentation

## 2021-05-17 DIAGNOSIS — R079 Chest pain, unspecified: Secondary | ICD-10-CM

## 2021-05-17 DIAGNOSIS — O0990 Supervision of high risk pregnancy, unspecified, unspecified trimester: Secondary | ICD-10-CM

## 2021-05-17 DIAGNOSIS — Z3A26 26 weeks gestation of pregnancy: Secondary | ICD-10-CM | POA: Diagnosis not present

## 2021-05-17 DIAGNOSIS — O26892 Other specified pregnancy related conditions, second trimester: Secondary | ICD-10-CM | POA: Diagnosis not present

## 2021-05-17 LAB — COMPREHENSIVE METABOLIC PANEL
ALT: 10 U/L (ref 0–44)
AST: 15 U/L (ref 15–41)
Albumin: 3.1 g/dL — ABNORMAL LOW (ref 3.5–5.0)
Alkaline Phosphatase: 44 U/L (ref 38–126)
Anion gap: 8 (ref 5–15)
BUN: 9 mg/dL (ref 6–20)
CO2: 22 mmol/L (ref 22–32)
Calcium: 8.8 mg/dL — ABNORMAL LOW (ref 8.9–10.3)
Chloride: 103 mmol/L (ref 98–111)
Creatinine, Ser: 0.87 mg/dL (ref 0.44–1.00)
GFR, Estimated: >60 mL/min (ref >60)
Glucose, Bld: 93 mg/dL (ref 70–99)
Potassium: 3.6 mmol/L (ref 3.5–5.1)
Sodium: 133 mmol/L — ABNORMAL LOW (ref 135–145)
Total Bilirubin: 0.4 mg/dL (ref 0.3–1.2)
Total Protein: 6.9 g/dL (ref 6.5–8.1)

## 2021-05-17 LAB — CBC WITH DIFFERENTIAL/PLATELET
Abs Immature Granulocytes: 0.03 10*3/uL (ref 0.00–0.07)
Basophils Absolute: 0 10*3/uL (ref 0.0–0.1)
Basophils Relative: 0 %
Eosinophils Absolute: 0.1 10*3/uL (ref 0.0–0.5)
Eosinophils Relative: 1 %
HCT: 29.9 % — ABNORMAL LOW (ref 36.0–46.0)
Hemoglobin: 10.2 g/dL — ABNORMAL LOW (ref 12.0–15.0)
Immature Granulocytes: 0 %
Lymphocytes Relative: 27 %
Lymphs Abs: 2 10*3/uL (ref 0.7–4.0)
MCH: 30.8 pg (ref 26.0–34.0)
MCHC: 34.1 g/dL (ref 30.0–36.0)
MCV: 90.3 fL (ref 80.0–100.0)
Monocytes Absolute: 0.6 10*3/uL (ref 0.1–1.0)
Monocytes Relative: 8 %
Neutro Abs: 4.7 10*3/uL (ref 1.7–7.7)
Neutrophils Relative %: 64 %
Platelets: 245 10*3/uL (ref 150–400)
RBC: 3.31 MIL/uL — ABNORMAL LOW (ref 3.87–5.11)
RDW: 13.4 % (ref 11.5–15.5)
WBC: 7.4 10*3/uL (ref 4.0–10.5)
nRBC: 0 % (ref 0.0–0.2)

## 2021-05-17 LAB — RESP PANEL BY RT-PCR (FLU A&B, COVID) ARPGX2
Influenza A by PCR: NEGATIVE
Influenza B by PCR: NEGATIVE
SARS Coronavirus 2 by RT PCR: NEGATIVE

## 2021-05-17 LAB — TROPONIN I (HIGH SENSITIVITY): Troponin I (High Sensitivity): <2 ng/L (ref <18)

## 2021-05-17 MED ORDER — ALBUTEROL SULFATE HFA 108 (90 BASE) MCG/ACT IN AERS
1.0000 | INHALATION_SPRAY | Freq: Once | RESPIRATORY_TRACT | Status: AC
  Administered 2021-05-17: 1 via RESPIRATORY_TRACT
  Filled 2021-05-17: qty 6.7
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
## 2021-05-17 NOTE — Progress Notes (Signed)
[name] [name] at [facility] notified that fhr is reassuring for gestational age.  Pt is OB cleared and monitors removed. ?
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
## 2021-05-17 NOTE — Progress Notes (Signed)
Dr Harolyn Rutherford notified of pt status, md says pt may be obstetrically cleared after 70min of fhr monitoring ?
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
## 2021-05-17 NOTE — Discharge Instructions (Addendum)
Use your albuterol inhaler as needed to help with your symptoms. ?Follow-up with your OB/GYN. ?Return to the ER if you start to experience worsening symptoms, increased chest tightness, if you develop chest pain, worsening shortness of breath, leg swelling or abdominal pain. ?

## 2021-05-17 NOTE — ED Triage Notes (Signed)
Tightness in her chest and fatigue since yesterday. States she is 6 months pregnant.  ?

## 2021-05-19 ENCOUNTER — Encounter (HOSPITAL_COMMUNITY): Payer: Self-pay | Admitting: Obstetrics and Gynecology

## 2021-05-19 ENCOUNTER — Inpatient Hospital Stay (HOSPITAL_COMMUNITY)
Admission: AD | Admit: 2021-05-19 | Discharge: 2021-05-19 | Disposition: A | Discharge status: Home / Self Care | Payer: 59 | Attending: Obstetrics and Gynecology | Admitting: Obstetrics and Gynecology

## 2021-05-19 ENCOUNTER — Other Ambulatory Visit: Payer: Self-pay

## 2021-05-19 DIAGNOSIS — O09512 Supervision of elderly primigravida, second trimester: Secondary | ICD-10-CM | POA: Insufficient documentation

## 2021-05-19 DIAGNOSIS — R55 Syncope and collapse: Secondary | ICD-10-CM | POA: Diagnosis not present

## 2021-05-19 DIAGNOSIS — Z3A27 27 weeks gestation of pregnancy: Secondary | ICD-10-CM | POA: Diagnosis not present

## 2021-05-19 DIAGNOSIS — R42 Dizziness and giddiness: Secondary | ICD-10-CM | POA: Diagnosis not present

## 2021-05-19 DIAGNOSIS — O2622 Pregnancy care for patient with recurrent pregnancy loss, second trimester: Secondary | ICD-10-CM | POA: Diagnosis not present

## 2021-05-19 DIAGNOSIS — O26892 Other specified pregnancy related conditions, second trimester: Secondary | ICD-10-CM | POA: Insufficient documentation

## 2021-05-19 LAB — URINALYSIS, ROUTINE W REFLEX MICROSCOPIC
Bacteria, UA: NONE SEEN
Bilirubin Urine: NEGATIVE
Glucose, UA: NEGATIVE mg/dL
Ketones, ur: NEGATIVE mg/dL
Leukocytes,Ua: NEGATIVE
Nitrite: NEGATIVE
Protein, ur: 100 mg/dL — AB
RBC / HPF: >50 RBC/hpf — ABNORMAL HIGH (ref 0–5)
Specific Gravity, Urine: 1.014 (ref 1.005–1.030)
pH: 8 (ref 5.0–8.0)

## 2021-05-19 NOTE — MAU Provider Note (Signed)
History     CSN: [id number]  Arrival date and time: 05/19/21 1128   Event Date/Time   First Provider Initiated Contact with Patient 05/19/21 1210      Chief Complaint  Patient presents with   Fall   HPI [name] [name] is a 41 y.o. [id number] at 73w1dwho presents with dizziness and an episode of syncope. She reports she has been feeling unwell for the last week due to allergies. She rented a car that smelled like cigarette smoke and it caused difficulty with her breathing. [facility]. She reports she has been resting and using the albuterol each morning since. She states this morning she felt better and did not use her albuterol. She reports since she was feeling better she was very active this am and only ate a few strawberries and a cup of coffee. She reports she was in the kitchen and felt dizzy so she sat down. The next thing she remembers is waking up on the floor and her feet and fingers felt "tingly."  She denies any abdominal or head trauma. She reports she felt short of breath when she woke up again.   She denies any abdominal pain, vaginal bleeding or discharge. She reports normal fetal movement. She reports she ate a full meal on the way to the hospital.   OB History     Gravida  6   Para  2   Term  2   Preterm      AB  3   Living  2      SAB  2   IAB      Ectopic      Multiple  0   Live Births  2           Past Medical History:  Diagnosis Date   ADHD    Anemia    Anxiety    Depression    Gestational hypertension 2017   Headache    Migraines   History of kidney stones    Kidney infection 2009   Preeclampsia, severe, third trimester 05/19/2019   Noted on arrival to MAU   Renal mass     Past Surgical History:  Procedure Laterality Date   DILATION AND EVACUATION N/A 11/27/2017   Procedure: DILATATION AND EVACUATION;  Surgeon: AOsborne Oman MD;  [facility];  Service: Gynecology;  Laterality: N/A;    FEMUR FRACTURE SURGERY Left    metal rod   LEG SURGERY Left 1999   steel rod in left femur bone    ROBOT ASSISTED LAPAROSCOPIC NEPHRECTOMY Right 07/21/2020   Procedure: XI ROBOTIC ASSISTED LAPAROSCOPIC RADICAL NEPHRECTOMY;  Surgeon: WCeasar Mons MD;  Location: [facility];  Service: Urology;  Laterality: Right;    Family History  Problem Relation Age of Onset   Hyperlipidemia Mother    Heart disease Maternal Aunt    Heart disease Maternal Uncle    Heart disease Paternal Aunt    Heart disease Maternal Grandfather    Heart disease Paternal Grandfather     Social History   Tobacco Use   Smoking status: Never   Smokeless tobacco: Never  Vaping Use   Vaping Use: Never used  Substance Use Topics   Alcohol use: Not Currently   Drug use: No    Allergies:  Allergies  Allergen Reactions   Lisinopril Hives and Itching    Hives on face,ears and neck   Sulfa Antibiotics Hives  Bactrim [Sulfamethoxazole-Trimethoprim] Hives and Swelling   Dust Mite Extract Hives   Penicillins Other (See Comments)    Fever-pt was really sick. Has patient had a PCN reaction causing immediate rash, facial/tongue/throat swelling, SOB or lightheadedness with hypotension: No Has patient had a PCN reaction causing severe rash involving mucus membranes or skin necrosis: No Has patient had a PCN reaction that required hospitalization Yes Has patient had a PCN reaction occurring within the last 10 years: No If all of the above answers are "NO", then may proceed with Cephalosporin use.     Medications Prior to Admission  Medication Sig Dispense Refill Last Dose   famotidine (PEPCID) 20 MG tablet Take 1 tablet (20 mg total) by mouth 2 (two) times daily. 60 tablet 3    fluticasone (FLONASE) 50 MCG/ACT nasal spray Place into both nostrils daily.      Magnesium 400 MG TABS Take 1 tablet by mouth daily. May increase to 2 tablets daily if headaches persist. 60 tablet 2    ondansetron (ZOFRAN ODT) 4 MG  disintegrating tablet Take 1 tablet (4 mg total) by mouth every 8 (eight) hours as needed for nausea or vomiting. 20 tablet 3    Prenatal MV-Min-Fe Fum-FA-DHA (PRENATAL 1 PO) Take 1 tablet by mouth daily.      promethazine (PHENERGAN) 25 MG tablet Take 1 tablet (25 mg total) by mouth every 6 (six) hours as needed for nausea or vomiting. 30 tablet 0    sertraline (ZOLOFT) 25 MG tablet Take 1 tablet (25 mg total) by mouth daily. 30 tablet 11     Review of Systems  Constitutional: Negative.  Negative for fatigue and fever.  HENT: Negative.    Respiratory:  Positive for shortness of breath.   Cardiovascular: Negative.  Negative for chest pain.  Gastrointestinal: Negative.  Negative for abdominal pain, constipation, diarrhea, nausea and vomiting.  Genitourinary: Negative.  Negative for dysuria, vaginal bleeding and vaginal discharge.  Neurological:  Positive for dizziness and syncope. Negative for headaches.  Physical Exam   Blood pressure (!) 120/49, pulse 81, temperature 99.1 F (37.3 C), temperature source Oral, resp. rate 19, height 5' 1.5" (1.562 m), weight 72.1 kg, last menstrual period 11/15/2020, SpO2 99 %, not currently breastfeeding.  Physical Exam Vitals and nursing note reviewed.  Constitutional:      General: She is not in acute distress.    Appearance: She is well-developed.  HENT:     Head: Normocephalic.  Eyes:     Pupils: Pupils are equal, round, and reactive to light.  Cardiovascular:     Rate and Rhythm: Normal rate and regular rhythm.     Heart sounds: Normal heart sounds.  Pulmonary:     Effort: Pulmonary effort is normal. No respiratory distress.     Breath sounds: Normal breath sounds.  Abdominal:     General: Bowel sounds are normal. There is no distension.     Palpations: Abdomen is soft.     Tenderness: There is no abdominal tenderness.  Skin:    General: Skin is warm and dry.  Neurological:     Mental Status: She is alert and oriented to person, place,  and time.  Psychiatric:        Mood and Affect: Mood normal.        Behavior: Behavior normal.        Thought Content: Thought content normal.        Judgment: Judgment normal.   Fetal Tracing:  Baseline: 145 Variability: moderate  Accels: none Decels: none  Toco: none  MAU Course  Procedures Results for orders placed or performed during the hospital encounter of 05/19/21 (from the past 24 hour(s))  Urinalysis, Routine w reflex microscopic Urine, Clean Catch     Status: Abnormal   Collection Time: 05/19/21 12:09 PM  Result Value Ref Range   Color, Urine YELLOW YELLOW   APPearance HAZY (A) CLEAR   Specific Gravity, Urine 1.014 1.005 - 1.030   pH 8.0 5.0 - 8.0   Glucose, UA NEGATIVE NEGATIVE mg/dL   Hgb urine dipstick SMALL (A) NEGATIVE   Bilirubin Urine NEGATIVE NEGATIVE   Ketones, ur NEGATIVE NEGATIVE mg/dL   Protein, ur 100 (A) NEGATIVE mg/dL   Nitrite NEGATIVE NEGATIVE   Leukocytes,Ua NEGATIVE NEGATIVE   RBC / HPF >50 (H) 0 - 5 RBC/hpf   WBC, UA 0-5 0 - 5 WBC/hpf   Bacteria, UA NONE SEEN NONE SEEN   Squamous Epithelial / LPF 0-5 0 - 5   Mucus PRESENT     MDM UA  Lengthy discussion with patient about importance of nutrition in pregnancy and increasing PO intake. Discussed albuterol use to prevent shortness of breath with allergies.   Given this is her second presentation to an ED with shortness of breath, CNM offered CT to rule out PE. Patient denies any shortness of breath or chest pain. Vital signs stable, no tachycardia and normal O2 saturation. Patient declines.   Offered nebulizer treatment while in MAU and patient states she will use her albuterol at home.  Shared decision making used to make a plan of care while in MAU. Patient reports feeling better and reassured overall. She reports she knows what to do with her nutrition and will be more diligent in the future. Patient verbalized understanding of when to use inhaler.   Assessment and Plan   1. Dizziness    2. [redacted] weeks gestation of pregnancy   3. Syncope, unspecified syncope type    -Discharge home in stable condition -PE precautions discussed -Patient advised to follow-up with OB as scheduled on Wednesday for prenatal care -Patient may return to MAU as needed or if her condition were to change or worsen   Wende Mott CNM 05/19/2021, 12:10 PM

## 2021-05-19 NOTE — Discharge Instructions (Signed)

## 2021-05-19 NOTE — MAU Note (Signed)
...  [name] [name] is a 41 y.o. at 22w1dhere in MAU reporting: Patient states she was "really hungry" and got dizzy around an hour ago and passed out. She states her mother saw her get up off of the kitchen chair and tried to get to her before she fell and was unsuccessful. She states the only thing she remembers prior this episode was feeling dizzy and that she went to sit down. When she woke up her hands and feet were tingling and have been tingling since. She is also endorsing floaters. Unsure if she hit her stomach or her head. Denies any pain. Denies chest pain but states she is feeling "a little shortness of breath" but states she was seen in the ED earlier this week for this issue and was prescribed an inhaler. Has not used her inhaler today. No VB or LOF. +FM.  ? ?Last PO: ?Chicken sandwich and fries at 1100 on the way here. She states she was trying to eat when she passed out. ? ?Last meal prior to this was around 1700 yesterday but states she drank water and gatorade after this. ? ?Denies feelings of anxiousness.  ? ?Pain score: Denies pain. ? ?FHT: 130 doppler ?Lab orders placed from triage: UA ? ?

## 2021-05-21 ENCOUNTER — Encounter: Payer: Self-pay | Admitting: Obstetrics and Gynecology

## 2021-05-21 NOTE — Progress Notes (Signed)
? ?  PRENATAL VISIT NOTE ? ?Subjective:  ?[name] [name] is a 41 y.o. [id number] at 28w5dbeing seen today for ongoing prenatal care.  She is currently monitored for the following issues for this high-risk pregnancy and has Uterine fibroid in pregnancy; History of pre-eclampsia in prior pregnancy, currently pregnant; Penicillin allergy; Chronic migraine; History of depression; Supervision of high risk pregnancy, antepartum; History of right radical nephrectomy; and History of renal cell carcinoma on their problem list. ? ?Patient reports no complaints - went to ED and given inhaler after SOB. She then went to MAU after LOC and thought to be due to low blood sugar so she is nervous to fast.  Contractions: Not present. Vag. Bleeding: None.  Movement: Present. Denies leaking of fluid.  ? ?The following portions of the patient's history were reviewed and updated as appropriate: allergies, current medications, past family history, past medical history, past social history, past surgical history and problem list.  ? ?Objective:  ? ?Vitals:  ? 05/23/21 0905  ?BP: 131/72  ?Pulse: 82  ?Weight: 157 lb 3.2 oz (71.3 kg)  ? ? ?Fetal Status: Fetal Heart Rate (bpm): 150 Fundal Height: 27 cm Movement: Present    ? ?General:  Alert, oriented and cooperative. Patient is in no acute distress.  ?Skin: Skin is warm and dry. No rash noted.   ?Cardiovascular: Normal heart rate noted  ?Respiratory: Normal respiratory effort, no problems with respiration noted  ?Abdomen: Soft, gravid, appropriate for gestational age.  Pain/Pressure: Absent     ?Pelvic: Cervical exam deferred        ?Extremities: Normal range of motion.  Edema: Trace  ?Mental Status: Normal mood and affect. Normal behavior. Normal judgment and thought content.  ? ?Assessment and Plan:  ?Pregnancy: [id number] 248w5d1. Supervision of high risk pregnancy, antepartum ?- 28 wk labs in separate visit due to not fasting today. She would like to do 1 hr next time but understands if  abnormal will need to do fasting f/u testing. She had LOC possibly due to low blood sugar and was seen in MAU. She would like to avoid fasting if possible. Offered finger stick - she declines.  ?- Offered and recommended tdap - pt accepts ?- Growth was 12%ile on scan on 2/17 - has f/u on 3/17 ? ?2. History of pre-eclampsia in prior pregnancy, currently pregnant ?- Continue ldasa ? ?3. Uterine fibroid in pregnancy ?- not noted on most recent usKoreaut was <3cm and subserosal ? ?4. History of depression ?- Stable on zoloft ? ?Preterm labor symptoms and general obstetric precautions including but not limited to vaginal bleeding, contractions, leaking of fluid and fetal movement were reviewed in detail with the patient. ?Please refer to After Visit Summary for other counseling recommendations.  ? ?Return in about 2 weeks (around 06/06/2021) for OB VISIT, MD or APP. ? ?Future Appointments  ?Date Time Provider [place]?05/23/2021  9:35 AM DuRadene GunningMD [facility]?05/28/2021  9:30 AM [facility] LAB [facility]  ?06/01/2021 11:00 AM [facility] NURSE [facility]  ?06/01/2021 11:15 AM [facility] US2 [facility]  ? ? ?PaRadene GunningMD ?

## 2021-05-23 ENCOUNTER — Other Ambulatory Visit: Payer: 59

## 2021-05-23 ENCOUNTER — Encounter: Payer: Self-pay | Admitting: Obstetrics and Gynecology

## 2021-05-23 ENCOUNTER — Other Ambulatory Visit: Payer: Self-pay

## 2021-05-23 ENCOUNTER — Ambulatory Visit (INDEPENDENT_AMBULATORY_CARE_PROVIDER_SITE_OTHER): Payer: 59 | Admitting: Obstetrics and Gynecology

## 2021-05-23 VITALS — BP 131/72 | HR 82 | Wt 157.2 lb

## 2021-05-23 DIAGNOSIS — O0990 Supervision of high risk pregnancy, unspecified, unspecified trimester: Secondary | ICD-10-CM

## 2021-05-23 DIAGNOSIS — O09299 Supervision of pregnancy with other poor reproductive or obstetric history, unspecified trimester: Secondary | ICD-10-CM

## 2021-05-23 DIAGNOSIS — Z23 Encounter for immunization: Secondary | ICD-10-CM | POA: Diagnosis not present

## 2021-05-23 DIAGNOSIS — Z8659 Personal history of other mental and behavioral disorders: Secondary | ICD-10-CM

## 2021-05-23 DIAGNOSIS — O3410 Maternal care for benign tumor of corpus uteri, unspecified trimester: Secondary | ICD-10-CM

## 2021-05-23 DIAGNOSIS — D259 Leiomyoma of uterus, unspecified: Secondary | ICD-10-CM

## 2021-05-28 ENCOUNTER — Other Ambulatory Visit: Payer: 59

## 2021-06-01 ENCOUNTER — Ambulatory Visit: Payer: Self-pay

## 2021-06-01 ENCOUNTER — Ambulatory Visit: Payer: 59

## 2021-06-07 DIAGNOSIS — K219 Gastro-esophageal reflux disease without esophagitis: Secondary | ICD-10-CM | POA: Insufficient documentation

## 2021-06-08 ENCOUNTER — Ambulatory Visit: Payer: 59 | Admitting: *Deleted

## 2021-06-08 ENCOUNTER — Other Ambulatory Visit: Payer: Self-pay

## 2021-06-08 ENCOUNTER — Encounter: Payer: Self-pay | Admitting: *Deleted

## 2021-06-08 ENCOUNTER — Ambulatory Visit: Payer: 59 | Attending: Obstetrics and Gynecology

## 2021-06-08 ENCOUNTER — Ambulatory Visit: Payer: 59 | Admitting: Certified Nurse Midwife

## 2021-06-08 ENCOUNTER — Ambulatory Visit (INDEPENDENT_AMBULATORY_CARE_PROVIDER_SITE_OTHER): Payer: 59 | Admitting: Certified Nurse Midwife

## 2021-06-08 ENCOUNTER — Other Ambulatory Visit: Payer: 59

## 2021-06-08 VITALS — BP 125/63 | HR 74

## 2021-06-08 VITALS — BP 135/68 | HR 70 | Wt 163.0 lb

## 2021-06-08 DIAGNOSIS — O09522 Supervision of elderly multigravida, second trimester: Secondary | ICD-10-CM | POA: Insufficient documentation

## 2021-06-08 DIAGNOSIS — K219 Gastro-esophageal reflux disease without esophagitis: Secondary | ICD-10-CM

## 2021-06-08 DIAGNOSIS — O219 Vomiting of pregnancy, unspecified: Secondary | ICD-10-CM | POA: Diagnosis present

## 2021-06-08 DIAGNOSIS — Z905 Acquired absence of kidney: Secondary | ICD-10-CM

## 2021-06-08 DIAGNOSIS — F419 Anxiety disorder, unspecified: Secondary | ICD-10-CM

## 2021-06-08 DIAGNOSIS — R55 Syncope and collapse: Secondary | ICD-10-CM

## 2021-06-08 DIAGNOSIS — O0993 Supervision of high risk pregnancy, unspecified, third trimester: Secondary | ICD-10-CM

## 2021-06-08 DIAGNOSIS — O99619 Diseases of the digestive system complicating pregnancy, unspecified trimester: Secondary | ICD-10-CM

## 2021-06-08 DIAGNOSIS — O0990 Supervision of high risk pregnancy, unspecified, unspecified trimester: Secondary | ICD-10-CM

## 2021-06-08 DIAGNOSIS — Z3A30 30 weeks gestation of pregnancy: Secondary | ICD-10-CM

## 2021-06-08 DIAGNOSIS — O99340 Other mental disorders complicating pregnancy, unspecified trimester: Secondary | ICD-10-CM

## 2021-06-08 DIAGNOSIS — O283 Abnormal ultrasonic finding on antenatal screening of mother: Secondary | ICD-10-CM | POA: Diagnosis present

## 2021-06-08 NOTE — Progress Notes (Signed)
? ?  PRENATAL VISIT NOTE ? ?Subjective:  ?[name] [name] is a 40 y.o. [id number] at 42w0dbeing seen today for ongoing prenatal care.  She is currently monitored for the following issues for this high-risk pregnancy and has Uterine fibroid in pregnancy; History of pre-eclampsia in prior pregnancy, currently pregnant; Penicillin allergy; Chronic migraine; History of depression; Supervision of high risk pregnancy, antepartum; Nausea and vomiting during pregnancy; History of right radical nephrectomy; History of renal cell carcinoma; and Gastroesophageal reflux in pregnancy on their problem list. ? ?Patient reports no complaints.  Contractions: Not present. Vag. Bleeding: None.  Movement: Present. Denies leaking of fluid.  ? ?The following portions of the patient's history were reviewed and updated as appropriate: allergies, current medications, past family history, past medical history, past social history, past surgical history and problem list.  ? ?Objective:  ? ?Vitals:  ? 06/08/21 0900  ?BP: 135/68  ?Pulse: 70  ?Weight: 163 lb (73.9 kg)  ? ? ?Fetal Status: Fetal Heart Rate (bpm): 154 Fundal Height: 30 cm Movement: Present  Presentation: Vertex ? ?General:  Alert, oriented and cooperative. Patient is in no acute distress.  ?Skin: Skin is warm and dry. No rash noted.   ?Cardiovascular: Normal heart rate noted  ?Respiratory: Normal respiratory effort, no problems with respiration noted  ?Abdomen: Soft, gravid, appropriate for gestational age.  Pain/Pressure: Present     ?Pelvic: Cervical exam deferred        ?Extremities: Normal range of motion.  Edema: Trace  ?Mental Status: Normal mood and affect. Normal behavior. Normal judgment and thought content.  ? ?Assessment and Plan:  ?Pregnancy: [id number] 322w0d1. Supervision of high risk pregnancy in third trimester ?- Doing well, feeling regular and vigorous fetal movement  ? ?2. [redacted] weeks gestation of pregnancy ?- Routine OB care - completed 1hr GTT this morning ? ?3.  Gastroesophageal reflux in pregnancy ?- Doing well on pepcid ? ?4. Anxiety during pregnancy ?- Much better controlled on zoloft with magnesium at night and more consistent eating ?- Released from MFM since growth appropriate today, feeling very positive about the pregnancy and excited  ? ?5. Syncope, unspecified syncope type ?- No further episodes, has been more careful about eating and increasing protein/water intake ? ?6. History of right radical nephrectomy ?- BP stable, will not redraw labs unless her BP goes up. ? ?Preterm labor symptoms and general obstetric precautions including but not limited to vaginal bleeding, contractions, leaking of fluid and fetal movement were reviewed in detail with the patient. ?Please refer to After Visit Summary for other counseling recommendations.  ? ?No follow-ups on file. ? ?Future Appointments  ?Date Time Provider [place]?06/26/2021  4:15 PM WeLuvenia ReddenPA-C [facility]? ? ?JaGabriel CarinaCNM ?

## 2021-06-09 ENCOUNTER — Other Ambulatory Visit: Payer: Self-pay | Admitting: Obstetrics and Gynecology

## 2021-06-09 DIAGNOSIS — O99013 Anemia complicating pregnancy, third trimester: Secondary | ICD-10-CM | POA: Insufficient documentation

## 2021-06-09 LAB — CBC
Hematocrit: 30.8 % — ABNORMAL LOW (ref 34.0–46.6)
Hemoglobin: 10.2 g/dL — ABNORMAL LOW (ref 11.1–15.9)
MCH: 30.3 pg (ref 26.6–33.0)
MCHC: 33.1 g/dL (ref 31.5–35.7)
MCV: 91 fL (ref 79–97)
Platelets: 242 10*3/uL (ref 150–450)
RBC: 3.37 x10E6/uL — ABNORMAL LOW (ref 3.77–5.28)
RDW: 13 % (ref 11.7–15.4)
WBC: 8.5 10*3/uL (ref 3.4–10.8)

## 2021-06-09 LAB — HIV ANTIBODY (ROUTINE TESTING W REFLEX): HIV Screen 4th Generation wRfx: NONREACTIVE

## 2021-06-09 LAB — RPR: RPR Ser Ql: NONREACTIVE

## 2021-06-09 LAB — GLUCOSE, 1 HOUR GESTATIONAL: Gestational Diabetes Screen: 128 mg/dL (ref 70–139)

## 2021-06-09 MED ORDER — FERROUS SULFATE 325 (65 FE) MG PO TABS: 325.0000 mg | ORAL_TABLET | ORAL | 3 refills | Status: DC

## 2021-06-11 NOTE — Progress Notes (Signed)
Visit moved to earlier in the morning. ?

## 2021-06-26 ENCOUNTER — Encounter: Payer: 59 | Admitting: Medical

## 2021-07-06 ENCOUNTER — Ambulatory Visit (INDEPENDENT_AMBULATORY_CARE_PROVIDER_SITE_OTHER): Payer: 59 | Admitting: Certified Nurse Midwife

## 2021-07-06 VITALS — BP 141/88 | HR 73 | Wt 165.0 lb

## 2021-07-06 DIAGNOSIS — F419 Anxiety disorder, unspecified: Secondary | ICD-10-CM

## 2021-07-06 DIAGNOSIS — O09299 Supervision of pregnancy with other poor reproductive or obstetric history, unspecified trimester: Secondary | ICD-10-CM

## 2021-07-06 DIAGNOSIS — O0990 Supervision of high risk pregnancy, unspecified, unspecified trimester: Secondary | ICD-10-CM

## 2021-07-06 DIAGNOSIS — O99340 Other mental disorders complicating pregnancy, unspecified trimester: Secondary | ICD-10-CM

## 2021-07-06 DIAGNOSIS — O0993 Supervision of high risk pregnancy, unspecified, third trimester: Secondary | ICD-10-CM

## 2021-07-06 DIAGNOSIS — Z3A33 33 weeks gestation of pregnancy: Secondary | ICD-10-CM

## 2021-07-06 DIAGNOSIS — O99619 Diseases of the digestive system complicating pregnancy, unspecified trimester: Secondary | ICD-10-CM

## 2021-07-06 DIAGNOSIS — K219 Gastro-esophageal reflux disease without esophagitis: Secondary | ICD-10-CM

## 2021-07-06 DIAGNOSIS — O99013 Anemia complicating pregnancy, third trimester: Secondary | ICD-10-CM

## 2021-07-06 NOTE — Progress Notes (Signed)
? ?  PRENATAL VISIT NOTE ? ?Subjective:  ?[name] [name] is a 41 y.o. [id number] at 35w0dbeing seen today for ongoing prenatal care.  She is currently monitored for the following issues for this high-risk pregnancy and has Uterine fibroid in pregnancy; History of pre-eclampsia in prior pregnancy, currently pregnant; Penicillin allergy; Chronic migraine; History of depression; Supervision of high risk pregnancy, antepartum; Nausea and vomiting during pregnancy; History of right radical nephrectomy; History of renal cell carcinoma; Gastroesophageal reflux in pregnancy; and Anemia during pregnancy in third trimester on their problem list. ? ?Patient reports no complaints.  Contractions: Not present. Vag. Bleeding: None.  Movement: Present. Denies leaking of fluid.  ? ?The following portions of the patient's history were reviewed and updated as appropriate: allergies, current medications, past family history, past medical history, past social history, past surgical history and problem list.  ? ?Objective:  ? ?Vitals:  ? 07/06/21 0904  ?BP: (!) 141/88  ?Pulse: 73  ?Weight: 165 lb (74.8 kg)  ? ? ?Fetal Status: Fetal Heart Rate (bpm): 148 Fundal Height: 34 cm Movement: Present    ? ?General:  Alert, oriented and cooperative. Patient is in no acute distress.  ?Skin: Skin is warm and dry. No rash noted.   ?Cardiovascular: Normal heart rate noted  ?Respiratory: Normal respiratory effort, no problems with respiration noted  ?Abdomen: Soft, gravid, appropriate for gestational age.  Pain/Pressure: Present     ?Pelvic: Cervical exam deferred        ?Extremities: Normal range of motion.  Edema: None  ?Mental Status: Normal mood and affect. Normal behavior. Normal judgment and thought content.  ? ?Assessment and Plan:  ?Pregnancy: [id number] 325w0d1. Supervision of high risk pregnancy in third trimester ?- Doing well, feeling regular and vigorous fetal movement  ? ?2. [redacted] weeks gestation of pregnancy ?- Routine OB care  ? ?3. Anemia  during pregnancy in third trimester ?- Taking oral iron, tolerating well ? ?4. Gastroesophageal reflux in pregnancy ?- Doing well on pepcid ? ?5. Anxiety during pregnancy ?- Well controlled on zoloft ? ?6. History of pre-eclampsia in prior pregnancy, currently pregnant ?- BP elevated today after two checks, will book BP check for Monday and draw labs if still elevated. ? ?Preterm labor symptoms and general obstetric precautions including but not limited to vaginal bleeding, contractions, leaking of fluid and fetal movement were reviewed in detail with the patient. ?Please refer to After Visit Summary for other counseling recommendations.  ? ?Return in about 2 weeks (around 07/20/2021) for HOB/GBS. ? ?Future Appointments  ?Date Time Provider [place]?07/09/2021  9:00 AM [facility] NURSE [facility]  ?07/25/2021 10:55 AM WaGabriel CarinaCNM [facility]? ? ?JaGabriel CarinaCNM ?

## 2021-07-09 ENCOUNTER — Ambulatory Visit (INDEPENDENT_AMBULATORY_CARE_PROVIDER_SITE_OTHER): Payer: 59

## 2021-07-09 VITALS — BP 137/80 | HR 66 | Wt 168.0 lb

## 2021-07-09 DIAGNOSIS — O0990 Supervision of high risk pregnancy, unspecified, unspecified trimester: Secondary | ICD-10-CM

## 2021-07-09 DIAGNOSIS — R030 Elevated blood-pressure reading, without diagnosis of hypertension: Secondary | ICD-10-CM

## 2021-07-09 DIAGNOSIS — O219 Vomiting of pregnancy, unspecified: Secondary | ICD-10-CM

## 2021-07-09 NOTE — Progress Notes (Signed)
Blood Pressure Check Visit ? ?[name] [name] is here for blood pressure check and lab work. She is 59w3dpregnant. BP today is 137/80. Patient denies any dizziness, blurred vision, headache, shortness of breath, peripheral edema. Reviewed with [name] CNM. I reviewed signs and symptoms of pre-eclampsia with patient and encouraged patient to continue checking her blood pressure at home. I reviewed patient's next appointment date and time with her. Patient verbalized understanding and denies any other questions or concerns.  ? ?MSeth Bake RN ?07/09/2021   ?

## 2021-07-10 LAB — COMPREHENSIVE METABOLIC PANEL
ALT: 7 IU/L (ref 0–32)
AST: 20 IU/L (ref 0–40)
Albumin/Globulin Ratio: 1.3 (ref 1.2–2.2)
Albumin: 3.7 g/dL — ABNORMAL LOW (ref 3.8–4.8)
Alkaline Phosphatase: 105 IU/L (ref 44–121)
BUN/Creatinine Ratio: 11 (ref 9–23)
BUN: 10 mg/dL (ref 6–24)
Bilirubin Total: <0.2 mg/dL (ref 0.0–1.2)
CO2: 20 mmol/L (ref 20–29)
Calcium: 9.5 mg/dL (ref 8.7–10.2)
Chloride: 104 mmol/L (ref 96–106)
Creatinine, Ser: 0.88 mg/dL (ref 0.57–1.00)
Globulin, Total: 2.9 g/dL (ref 1.5–4.5)
Glucose: 85 mg/dL (ref 70–99)
Potassium: 4.6 mmol/L (ref 3.5–5.2)
Sodium: 136 mmol/L (ref 134–144)
Total Protein: 6.6 g/dL (ref 6.0–8.5)
eGFR: 85 mL/min/{1.73_m2} (ref >59)

## 2021-07-10 LAB — CBC
Hematocrit: 35 % (ref 34.0–46.6)
Hemoglobin: 12.1 g/dL (ref 11.1–15.9)
MCH: 31.5 pg (ref 26.6–33.0)
MCHC: 34.6 g/dL (ref 31.5–35.7)
MCV: 91 fL (ref 79–97)
Platelets: 239 10*3/uL (ref 150–450)
RBC: 3.84 x10E6/uL (ref 3.77–5.28)
RDW: 13.1 % (ref 11.7–15.4)
WBC: 6.8 10*3/uL (ref 3.4–10.8)

## 2021-07-10 LAB — PROTEIN / CREATININE RATIO, URINE
Creatinine, Urine: 207.5 mg/dL
Protein, Ur: 36.8 mg/dL
Protein/Creat Ratio: 177 mg/g creat (ref 0–200)

## 2021-07-16 ENCOUNTER — Encounter (HOSPITAL_COMMUNITY): Payer: Self-pay | Admitting: Obstetrics and Gynecology

## 2021-07-16 ENCOUNTER — Inpatient Hospital Stay (HOSPITAL_COMMUNITY)
Admission: AD | Admit: 2021-07-16 | Discharge: 2021-07-17 | Disposition: A | Discharge status: Home / Self Care | Payer: 59 | Attending: Obstetrics and Gynecology | Admitting: Obstetrics and Gynecology

## 2021-07-16 DIAGNOSIS — O0940 Supervision of pregnancy with grand multiparity, unspecified trimester: Secondary | ICD-10-CM | POA: Insufficient documentation

## 2021-07-16 DIAGNOSIS — O26893 Other specified pregnancy related conditions, third trimester: Secondary | ICD-10-CM | POA: Diagnosis present

## 2021-07-16 DIAGNOSIS — O139 Gestational [pregnancy-induced] hypertension without significant proteinuria, unspecified trimester: Secondary | ICD-10-CM | POA: Insufficient documentation

## 2021-07-16 DIAGNOSIS — Z3A35 35 weeks gestation of pregnancy: Secondary | ICD-10-CM | POA: Diagnosis not present

## 2021-07-16 DIAGNOSIS — O368390 Maternal care for abnormalities of the fetal heart rate or rhythm, unspecified trimester, not applicable or unspecified: Secondary | ICD-10-CM

## 2021-07-16 DIAGNOSIS — O0990 Supervision of high risk pregnancy, unspecified, unspecified trimester: Secondary | ICD-10-CM

## 2021-07-16 DIAGNOSIS — O09293 Supervision of pregnancy with other poor reproductive or obstetric history, third trimester: Secondary | ICD-10-CM | POA: Insufficient documentation

## 2021-07-16 DIAGNOSIS — O368330 Maternal care for abnormalities of the fetal heart rate or rhythm, third trimester, not applicable or unspecified: Secondary | ICD-10-CM | POA: Diagnosis not present

## 2021-07-16 DIAGNOSIS — O219 Vomiting of pregnancy, unspecified: Secondary | ICD-10-CM

## 2021-07-16 DIAGNOSIS — R519 Headache, unspecified: Secondary | ICD-10-CM | POA: Diagnosis not present

## 2021-07-16 LAB — COMPREHENSIVE METABOLIC PANEL
ALT: 11 U/L (ref 0–44)
AST: 20 U/L (ref 15–41)
Albumin: 3.1 g/dL — ABNORMAL LOW (ref 3.5–5.0)
Alkaline Phosphatase: 89 U/L (ref 38–126)
Anion gap: 10 (ref 5–15)
BUN: 9 mg/dL (ref 6–20)
CO2: 19 mmol/L — ABNORMAL LOW (ref 22–32)
Calcium: 9.3 mg/dL (ref 8.9–10.3)
Chloride: 105 mmol/L (ref 98–111)
Creatinine, Ser: 0.95 mg/dL (ref 0.44–1.00)
GFR, Estimated: >60 mL/min (ref >60)
Glucose, Bld: 95 mg/dL (ref 70–99)
Potassium: 3.9 mmol/L (ref 3.5–5.1)
Sodium: 134 mmol/L — ABNORMAL LOW (ref 135–145)
Total Bilirubin: 0.5 mg/dL (ref 0.3–1.2)
Total Protein: 6.4 g/dL — ABNORMAL LOW (ref 6.5–8.1)

## 2021-07-16 LAB — CBC
HCT: 32.3 % — ABNORMAL LOW (ref 36.0–46.0)
Hemoglobin: 11.1 g/dL — ABNORMAL LOW (ref 12.0–15.0)
MCH: 31.3 pg (ref 26.0–34.0)
MCHC: 34.4 g/dL (ref 30.0–36.0)
MCV: 91 fL (ref 80.0–100.0)
Platelets: 193 10*3/uL (ref 150–400)
RBC: 3.55 MIL/uL — ABNORMAL LOW (ref 3.87–5.11)
RDW: 13.6 % (ref 11.5–15.5)
WBC: 7.6 10*3/uL (ref 4.0–10.5)
nRBC: 0 % (ref 0.0–0.2)

## 2021-07-16 MED ORDER — LACTATED RINGERS IV SOLN: INTRAVENOUS | Status: DC

## 2021-07-16 MED ORDER — LABETALOL HCL 5 MG/ML IV SOLN: 40.0000 mg | INTRAVENOUS | Status: DC | PRN

## 2021-07-16 MED ORDER — LABETALOL HCL 5 MG/ML IV SOLN: 80.0000 mg | INTRAVENOUS | Status: DC | PRN

## 2021-07-16 MED ORDER — LABETALOL HCL 5 MG/ML IV SOLN
20.0000 mg | INTRAVENOUS | Status: DC | PRN
  Filled 2021-07-16: qty 4

## 2021-07-16 MED ORDER — METOCLOPRAMIDE HCL 10 MG PO TABS
10.0000 mg | ORAL_TABLET | Freq: Once | ORAL | Status: AC
  Administered 2021-07-16: 10 mg via ORAL
  Filled 2021-07-16: qty 1

## 2021-07-16 MED ORDER — HYDRALAZINE HCL 20 MG/ML IJ SOLN: 10.0000 mg | INTRAMUSCULAR | Status: DC | PRN

## 2021-07-16 MED ORDER — ACETAMINOPHEN 500 MG PO TABS
1000.0000 mg | ORAL_TABLET | Freq: Once | ORAL | Status: AC
  Administered 2021-07-16: 1000 mg via ORAL
  Filled 2021-07-16: qty 2

## 2021-07-16 NOTE — MAU Provider Note (Signed)
?History  ?  ? ?[id number] ? ?Arrival date and time: 07/16/21 2005 ?  ? ?Chief Complaint  ?Patient presents with  ? Headache  ? ? ? ?HPI ?[name] [name] is a 41 y.o. at 71w4dby ultrasound with PMHx notable for severe preeclampsia in previous pregnancy & renal carcinoma, who presents for hypertension & headache.  ?Reports new onset BP elevation with BP up to 150s/100s at home today. Has had headache today that was sharp & 10/10. Came down to 6/10 after tylenol & caffeine at home. Has associated blurred vision with headache. Denies CP, epigastric pain, contractions, or vaginal bleeding. Reports good fetal movement. Has history of gestational hypertension with first pregnancy & severe preeclampsia with last pregnancy. Had hypertension last year when diagnosed with renal carcinoma that resolved after her nephrectomy. Otherwise denies hypertension outside of pregnancy.   ? ?OB History   ? ? Gravida  ?6  ? Para  ?2  ? Term  ?2  ? Preterm  ?   ? AB  ?3  ? Living  ?2  ?  ? ? SAB  ?3  ? IAB  ?   ? Ectopic  ?   ? Multiple  ?0  ? Live Births  ?2  ?   ?  ?  ? ? ?Past Medical History:  ?Diagnosis Date  ? ADHD   ? Anemia   ? Anxiety   ? Depression   ? Gestational hypertension 2017  ? Headache   ? Migraines  ? History of kidney stones   ? Kidney infection 2009  ? Preeclampsia, severe, third trimester 05/19/2019  ? Noted on arrival to MAU  ? Renal mass   ? ? ?Past Surgical History:  ?Procedure Laterality Date  ? DILATION AND EVACUATION N/A 11/27/2017  ? Procedure: DILATATION AND EVACUATION;  Surgeon: AOsborne Oman MD;  [facility];  Service: Gynecology;  Laterality: N/A;  ? FEMUR FRACTURE SURGERY Left   ? metal rod  ? LEG SURGERY Left 1999  ? steel rod in left femur bone   ? ROBOT ASSISTED LAPAROSCOPIC NEPHRECTOMY Right 07/21/2020  ? Procedure: XI ROBOTIC ASSISTED LAPAROSCOPIC RADICAL NEPHRECTOMY;  Surgeon: WCeasar Mons MD;  Location: [facility];  Service: Urology;  Laterality: Right;  ? ? ?Family History  ?Problem  Relation Age of Onset  ? Hyperlipidemia Mother   ? Heart disease Maternal Aunt   ? Heart disease Maternal Uncle   ? Heart disease Paternal Aunt   ? Heart disease Maternal Grandfather   ? Heart disease Paternal Grandfather   ? ? ?Allergies  ?Allergen Reactions  ? Lisinopril Hives and Itching  ?  Hives on face,ears and neck  ? Sulfa Antibiotics Hives  ? Bactrim [Sulfamethoxazole-Trimethoprim] Hives and Swelling  ? Dust Mite Extract Hives  ? Penicillins Other (See Comments)  ?  Fever-pt was really sick. ?Has patient had a PCN reaction causing immediate rash, facial/tongue/throat swelling, SOB or lightheadedness with hypotension: No ?Has patient had a PCN reaction causing severe rash involving mucus membranes or skin necrosis: No ?Has patient had a PCN reaction that required hospitalization Yes ?Has patient had a PCN reaction occurring within the last 10 years: No ?If all of the above answers are "NO", then may proceed with Cephalosporin use. ?  ? ? ?No current facility-administered medications on file prior to encounter.  ? ?Current Outpatient Medications on File Prior to Encounter  ?Medication Sig Dispense Refill  ? famotidine (PEPCID) 20 MG tablet Take 1 tablet (20 mg total)  by mouth 2 (two) times daily. 60 tablet 3  ? ferrous sulfate (FERROUSUL) 325 (65 FE) MG tablet Take 1 tablet (325 mg total) by mouth every other day. 30 tablet 3  ? fluticasone (FLONASE) 50 MCG/ACT nasal spray Place into both nostrils daily.    ? Magnesium 400 MG TABS Take 1 tablet by mouth daily. May increase to 2 tablets daily if headaches persist. 60 tablet 2  ? ondansetron (ZOFRAN ODT) 4 MG disintegrating tablet Take 1 tablet (4 mg total) by mouth every 8 (eight) hours as needed for nausea or vomiting. 20 tablet 3  ? Prenatal MV-Min-Fe Fum-FA-DHA (PRENATAL 1 PO) Take 1 tablet by mouth daily.    ? promethazine (PHENERGAN) 25 MG tablet Take 1 tablet (25 mg total) by mouth every 6 (six) hours as needed for nausea or vomiting. 30 tablet 0  ?  sertraline (ZOLOFT) 25 MG tablet Take 1 tablet (25 mg total) by mouth daily. 30 tablet 11  ? ? ? ?ROS ?Pertinent positives and negative per HPI, all others reviewed and negative ? ?Physical Exam  ? ?BP (!) 148/96   Pulse 61   Temp 98.5 ?F (36.9 ?C) (Oral)   Resp 18   Ht 5' 1.5" (1.562 m)   Wt 77.7 kg   LMP 11/15/2020 (Approximate) Comment: pt (-) preg test  11/09/20  SpO2 100%   BMI 31.84 kg/m?  ? ?Patient Vitals for the past 24 hrs: ? BP Temp Temp src Pulse Resp SpO2 Height Weight  ?07/17/21 0140 (!) 148/96 -- -- 61 18 100 % -- --  ?07/17/21 0000 -- -- -- -- -- 100 % -- --  ?07/16/21 2345 (!) 143/90 -- -- 60 -- 100 % -- --  ?07/16/21 2330 127/86 -- -- 66 -- 100 % -- --  ?07/16/21 2315 (!) 144/96 -- -- 62 -- 100 % -- --  ?07/16/21 2300 (!) 151/89 -- -- 61 -- 100 % -- --  ?07/16/21 2249 (!) 142/88 -- -- 65 -- -- -- --  ?07/16/21 2230 (!) 139/93 -- -- (!) 59 -- 99 % -- --  ?07/16/21 2215 (!) 145/84 -- -- 66 -- 99 % -- --  ?07/16/21 2200 (!) 160/90 -- -- 61 -- 99 % -- --  ?07/16/21 2145 (!) 143/92 -- -- 63 -- 99 % -- --  ?07/16/21 2130 (!) 142/86 -- -- 70 -- 100 % -- --  ?07/16/21 2115 (!) 162/99 -- -- 67 -- 100 % -- --  ?07/16/21 2100 -- -- -- -- -- 100 % -- --  ?07/16/21 2057 (!) 154/90 -- -- 62 18 99 % -- --  ?07/16/21 2026 (!) 145/84 98.5 ?F (36.9 ?C) Oral 65 20 -- 5' 1.5" (1.562 m) 77.7 kg  ? ? ?Physical Exam ?Vitals and nursing note reviewed.  ?Constitutional:   ?   General: She is not in acute distress. ?   Appearance: She is well-developed.  ?HENT:  ?   Head: Normocephalic and atraumatic.  ?Eyes:  ?   Extraocular Movements: Extraocular movements intact.  ?   Pupils: Pupils are equal, round, and reactive to light.  ?Pulmonary:  ?   Effort: Pulmonary effort is normal. No respiratory distress.  ?Musculoskeletal:  ?   Right lower leg: 2+ Pitting Edema present.  ?   Left lower leg: 2+ Pitting Edema present.  ?Skin: ?   General: Skin is warm and dry.  ?Neurological:  ?   Mental Status: She is alert.  ?   Deep  Tendon  Reflexes:  ?   Reflex Scores: ?     Patellar reflexes are 2+ on the right side and 2+ on the left side. ?   Comments: No clonus  ?  ? ?FHT ?Baseline 155, moderate variability, 15x15 accels, spontaneous decel ?Toco: irregular ? ? ?Labs ?Results for orders placed or performed during the hospital encounter of 07/16/21 (from the past 24 hour(s))  ?Protein / creatinine ratio, urine     Status: None (Preliminary result)  ? Collection Time: 07/16/21  8:43 PM  ?Result Value Ref Range  ? Creatinine, Urine PENDING mg/dL  ? Total Protein, Urine 15 mg/dL  ? Protein Creatinine Ratio PENDING 0.00 - 0.15 mg/mg[Cre]  ?CBC     Status: Abnormal  ? Collection Time: 07/16/21  8:48 PM  ?Result Value Ref Range  ? WBC 7.6 4.0 - 10.5 K/uL  ? RBC 3.55 (L) 3.87 - 5.11 MIL/uL  ? Hemoglobin 11.1 (L) 12.0 - 15.0 g/dL  ? HCT 32.3 (L) 36.0 - 46.0 %  ? MCV 91.0 80.0 - 100.0 fL  ? MCH 31.3 26.0 - 34.0 pg  ? MCHC 34.4 30.0 - 36.0 g/dL  ? RDW 13.6 11.5 - 15.5 %  ? Platelets 193 150 - 400 K/uL  ? nRBC 0.0 0.0 - 0.2 %  ?Comprehensive metabolic panel     Status: Abnormal  ? Collection Time: 07/16/21  8:48 PM  ?Result Value Ref Range  ? Sodium 134 (L) 135 - 145 mmol/L  ? Potassium 3.9 3.5 - 5.1 mmol/L  ? Chloride 105 98 - 111 mmol/L  ? CO2 19 (L) 22 - 32 mmol/L  ? Glucose, Bld 95 70 - 99 mg/dL  ? BUN 9 6 - 20 mg/dL  ? Creatinine, Ser 0.95 0.44 - 1.00 mg/dL  ? Calcium 9.3 8.9 - 10.3 mg/dL  ? Total Protein 6.4 (L) 6.5 - 8.1 g/dL  ? Albumin 3.1 (L) 3.5 - 5.0 g/dL  ? AST 20 15 - 41 U/L  ? ALT 11 0 - 44 U/L  ? Alkaline Phosphatase 89 38 - 126 U/L  ? Total Bilirubin 0.5 0.3 - 1.2 mg/dL  ? GFR, Estimated >60 >60 mL/min  ? Anion gap 10 5 - 15  ? ? ?Imaging ?No results found. ? ?MAU Course  ?Procedures ? ?Lab Orders    ?     CBC    ?     Comprehensive metabolic panel    ?     Protein / creatinine ratio, urine    ?Meds ordered this encounter  ?Medications  ? AND Linked Order Group  ?  labetalol (NORMODYNE) injection 20 mg  ?  labetalol (NORMODYNE) injection  40 mg  ?  labetalol (NORMODYNE) injection 80 mg  ?  hydrALAZINE (APRESOLINE) injection 10 mg  ? lactated ringers infusion  ? acetaminophen (TYLENOL) tablet 1,000 mg  ? metoCLOPramide (REGLAN) tablet 10 mg  ? ? ?Ima

## 2021-07-16 NOTE — MAU Note (Addendum)
RN notified MAU provider about severe BP and given verbal order to admin Labetalol '20mg'$  IV for after two Severe Bps within 2 hours, no repeat of 15 mins apart needed. RN verified order with MAU provider after BP is now not severe BP at time of IV access obtained. Verbal order to hold until next severe BP.  ?

## 2021-07-16 NOTE — MAU Note (Signed)
Pt says at 230pm- was dizzy, vision blurry  and got H/A ?Took BP- 157/94 called MW[facility]- pt  drank protein shake  and water and   1000 mg Tylenol - did not help H/A ?Now has H/A- pain 5, not  dizzy , really slight blurred vision ?[place]- with Jamilla CNM  ? ?

## 2021-07-17 ENCOUNTER — Inpatient Hospital Stay (HOSPITAL_BASED_OUTPATIENT_CLINIC_OR_DEPARTMENT_OTHER): Payer: 59

## 2021-07-17 ENCOUNTER — Telehealth: Payer: Self-pay | Admitting: Family Medicine

## 2021-07-17 ENCOUNTER — Telehealth: Payer: Self-pay | Admitting: Certified Nurse Midwife

## 2021-07-17 ENCOUNTER — Encounter (HOSPITAL_COMMUNITY): Payer: Self-pay | Admitting: Obstetrics and Gynecology

## 2021-07-17 DIAGNOSIS — O139 Gestational [pregnancy-induced] hypertension without significant proteinuria, unspecified trimester: Secondary | ICD-10-CM | POA: Diagnosis not present

## 2021-07-17 DIAGNOSIS — O368390 Maternal care for abnormalities of the fetal heart rate or rhythm, unspecified trimester, not applicable or unspecified: Secondary | ICD-10-CM | POA: Diagnosis not present

## 2021-07-17 DIAGNOSIS — O0940 Supervision of pregnancy with grand multiparity, unspecified trimester: Secondary | ICD-10-CM

## 2021-07-17 DIAGNOSIS — Z3A35 35 weeks gestation of pregnancy: Secondary | ICD-10-CM | POA: Diagnosis not present

## 2021-07-17 DIAGNOSIS — O368330 Maternal care for abnormalities of the fetal heart rate or rhythm, third trimester, not applicable or unspecified: Secondary | ICD-10-CM | POA: Diagnosis not present

## 2021-07-17 LAB — PROTEIN / CREATININE RATIO, URINE
Creatinine, Urine: 104.41 mg/dL
Protein Creatinine Ratio: 0.14 mg/mg{Cre} (ref 0.00–0.15)
Total Protein, Urine: 15 mg/dL

## 2021-07-17 NOTE — Telephone Encounter (Signed)
Called patient to schedule BP check for this week, there was no answer to the phone call so a voicemail was left with the call back number for the office and a mychart message was sent. ?

## 2021-07-19 ENCOUNTER — Ambulatory Visit (INDEPENDENT_AMBULATORY_CARE_PROVIDER_SITE_OTHER): Payer: 59

## 2021-07-19 VITALS — BP 146/91 | HR 72 | Wt 171.0 lb

## 2021-07-19 DIAGNOSIS — O0990 Supervision of high risk pregnancy, unspecified, unspecified trimester: Secondary | ICD-10-CM

## 2021-07-19 DIAGNOSIS — O139 Gestational [pregnancy-induced] hypertension without significant proteinuria, unspecified trimester: Secondary | ICD-10-CM

## 2021-07-19 DIAGNOSIS — O0940 Supervision of pregnancy with grand multiparity, unspecified trimester: Secondary | ICD-10-CM

## 2021-07-19 NOTE — Progress Notes (Signed)
Pt here today for Blood pressure check. Pt denies any headaches, dizziness, visual changes today. No swelling noted at today's visit. Pt currently taking just Magnesium and Tylenol which does resolve headaches since being seen at MAU on 07/16/21. +FM with no VB or discharge.  ? ?BP today: 146/91 ?Reviewed BP with Dr Rip Harbour. Pt advised to continue to monitor symptoms and if worsens, go to MAU. Pt verbalized understanding. Pt has scheduled IOL on 5/12 for GHTN and has next OB appt on 5/10. ? ? ?Colletta Maryland, RN   ?

## 2021-07-21 ENCOUNTER — Other Ambulatory Visit: Payer: Self-pay | Admitting: Certified Nurse Midwife

## 2021-07-23 MED ORDER — PROMETHAZINE HCL 25 MG PO TABS: 25.0000 mg | ORAL_TABLET | Freq: Four times a day (QID) | ORAL | 0 refills | Status: DC | PRN

## 2021-07-25 ENCOUNTER — Telehealth (HOSPITAL_COMMUNITY): Payer: Self-pay | Admitting: *Deleted

## 2021-07-25 ENCOUNTER — Ambulatory Visit (INDEPENDENT_AMBULATORY_CARE_PROVIDER_SITE_OTHER): Payer: 59 | Admitting: Certified Nurse Midwife

## 2021-07-25 ENCOUNTER — Encounter (HOSPITAL_COMMUNITY): Payer: Self-pay

## 2021-07-25 ENCOUNTER — Inpatient Hospital Stay (EMERGENCY_DEPARTMENT_HOSPITAL)
Admission: AD | Admit: 2021-07-25 | Discharge: 2021-07-25 | Disposition: A | Discharge status: Home / Self Care | Payer: 59 | Source: Home / Self Care | Attending: Obstetrics and Gynecology | Admitting: Obstetrics and Gynecology

## 2021-07-25 ENCOUNTER — Encounter (HOSPITAL_COMMUNITY): Payer: Self-pay | Admitting: Obstetrics and Gynecology

## 2021-07-25 VITALS — BP 146/90 | HR 57 | Wt 167.5 lb

## 2021-07-25 DIAGNOSIS — Z3A36 36 weeks gestation of pregnancy: Secondary | ICD-10-CM

## 2021-07-25 DIAGNOSIS — O10913 Unspecified pre-existing hypertension complicating pregnancy, third trimester: Secondary | ICD-10-CM | POA: Insufficient documentation

## 2021-07-25 DIAGNOSIS — O09299 Supervision of pregnancy with other poor reproductive or obstetric history, unspecified trimester: Secondary | ICD-10-CM

## 2021-07-25 DIAGNOSIS — O09293 Supervision of pregnancy with other poor reproductive or obstetric history, third trimester: Secondary | ICD-10-CM | POA: Insufficient documentation

## 2021-07-25 DIAGNOSIS — O133 Gestational [pregnancy-induced] hypertension without significant proteinuria, third trimester: Secondary | ICD-10-CM

## 2021-07-25 DIAGNOSIS — O09523 Supervision of elderly multigravida, third trimester: Secondary | ICD-10-CM | POA: Insufficient documentation

## 2021-07-25 DIAGNOSIS — O1414 Severe pre-eclampsia complicating childbirth: Secondary | ICD-10-CM | POA: Diagnosis not present

## 2021-07-25 DIAGNOSIS — O99343 Other mental disorders complicating pregnancy, third trimester: Secondary | ICD-10-CM | POA: Insufficient documentation

## 2021-07-25 DIAGNOSIS — O26893 Other specified pregnancy related conditions, third trimester: Secondary | ICD-10-CM | POA: Insufficient documentation

## 2021-07-25 DIAGNOSIS — O0990 Supervision of high risk pregnancy, unspecified, unspecified trimester: Secondary | ICD-10-CM

## 2021-07-25 LAB — CBC
HCT: 31.8 % — ABNORMAL LOW (ref 36.0–46.0)
Hemoglobin: 10.7 g/dL — ABNORMAL LOW (ref 12.0–15.0)
MCH: 31 pg (ref 26.0–34.0)
MCHC: 33.6 g/dL (ref 30.0–36.0)
MCV: 92.2 fL (ref 80.0–100.0)
Platelets: 185 10*3/uL (ref 150–400)
RBC: 3.45 MIL/uL — ABNORMAL LOW (ref 3.87–5.11)
RDW: 13.9 % (ref 11.5–15.5)
WBC: 7.1 10*3/uL (ref 4.0–10.5)
nRBC: 0 % (ref 0.0–0.2)

## 2021-07-25 LAB — COMPREHENSIVE METABOLIC PANEL
ALT: 11 U/L (ref 0–44)
AST: 20 U/L (ref 15–41)
Albumin: 2.8 g/dL — ABNORMAL LOW (ref 3.5–5.0)
Alkaline Phosphatase: 99 U/L (ref 38–126)
Anion gap: 7 (ref 5–15)
BUN: 11 mg/dL (ref 6–20)
CO2: 23 mmol/L (ref 22–32)
Calcium: 8.9 mg/dL (ref 8.9–10.3)
Chloride: 108 mmol/L (ref 98–111)
Creatinine, Ser: 1.11 mg/dL — ABNORMAL HIGH (ref 0.44–1.00)
GFR, Estimated: >60 mL/min (ref >60)
Glucose, Bld: 120 mg/dL — ABNORMAL HIGH (ref 70–99)
Potassium: 4.1 mmol/L (ref 3.5–5.1)
Sodium: 138 mmol/L (ref 135–145)
Total Bilirubin: 0.4 mg/dL (ref 0.3–1.2)
Total Protein: 6.3 g/dL — ABNORMAL LOW (ref 6.5–8.1)

## 2021-07-25 LAB — PROTEIN / CREATININE RATIO, URINE
Creatinine, Urine: 441 mg/dL
Protein Creatinine Ratio: 0.1 mg/mg{Cre} (ref 0.00–0.15)
Total Protein, Urine: 42 mg/dL

## 2021-07-25 MED ORDER — METOCLOPRAMIDE HCL 10 MG PO TABS
10.0000 mg | ORAL_TABLET | Freq: Once | ORAL | Status: AC
  Administered 2021-07-25: 10 mg via ORAL
  Filled 2021-07-25: qty 1

## 2021-07-25 MED ORDER — ACETAMINOPHEN 500 MG PO TABS
1000.0000 mg | ORAL_TABLET | Freq: Once | ORAL | Status: AC
  Administered 2021-07-25: 1000 mg via ORAL
  Filled 2021-07-25: qty 2

## 2021-07-25 NOTE — Telephone Encounter (Signed)
Preadmission screen  

## 2021-07-25 NOTE — MAU Note (Addendum)
[name] [name] is a 41 y.o. at 58w5dhere in MAU reporting: high blood pressure of 140/90s today at her appointment. Pt reports HA, and blurry vision. No epigastric pain. +FM. Pt denies VB and discharge. No LOF ? ?Onset of complaint: 07/25/2021 ? ?Pain score: 2/10 Headache ? ?Vitals:  ? 07/25/21 1742  ?BP: (!) 143/88  ?Pulse: 84  ?Resp: 18  ?Temp: 98.2 ?F (36.8 ?C)  ?SpO2: 100%  ?   ? ?FHT:160 ?Lab orders placed from triage:  UA ?

## 2021-07-25 NOTE — Progress Notes (Signed)
? ?  PRENATAL VISIT NOTE ? ?Subjective:  ?[name] [name] is a 41 y.o. [id number] at 62w5dbeing seen today for ongoing prenatal care.  She is currently monitored for the following issues for this high-risk pregnancy and has Uterine fibroid in pregnancy; History of pre-eclampsia in prior pregnancy, currently pregnant; Penicillin allergy; Chronic migraine; History of depression; Supervision of high risk pregnancy, antepartum; Nausea and vomiting during pregnancy; History of right radical nephrectomy; History of renal cell carcinoma; Gastroesophageal reflux in pregnancy; and Anemia during pregnancy in third trimester on their problem list. ? ?Patient reports  worsening nausea (no vomiting), almost constant dull headaches that are only partially helped by Tylenol and spells every day of blurred vision and dizziness. Resolve with rest but she overall isn't feeling well. Zofran and diclegis are not helping her nausea. .  Contractions: Irritability. Vag. Bleeding: None.  Movement: Present. Denies leaking of fluid.  ? ?The following portions of the patient's history were reviewed and updated as appropriate: allergies, current medications, past family history, past medical history, past social history, past surgical history and problem list.  ? ?Objective:  ? ?Vitals:  ? 07/25/21 1146 07/25/21 1205  ?BP: (!) 140/98 (!) 146/90  ?Pulse: 60 (!) 57  ?Weight: 167 lb 8 oz (76 kg)   ? ?Fetal Status: Fetal Heart Rate (bpm): 141 Fundal Height: 34 cm Movement: Present    ? ?General:  Alert, oriented and cooperative. Patient is in no acute distress.  ?Skin: Skin is warm and dry. No rash noted.   ?Cardiovascular: Normal heart rate noted  ?Respiratory: Normal respiratory effort, no problems with respiration noted  ?Abdomen: Soft, gravid, appropriate for gestational age.  Pain/Pressure: Present     ?Pelvic: Cervical exam deferred        ?Extremities: Normal range of motion.  Edema: None  ?Mental Status: Normal mood and affect. Normal behavior.  Normal judgment and thought content.  ? ?Assessment and Plan:  ?Pregnancy: [id number] 350w5d1. Supervision of high risk pregnancy, antepartum ?- Feeling fetal movement, but feels it has slowed down. Not feeling well at all. ?- Slight fetal arrhythmia heard on Doppler, but no decelerations. ? ?2. [redacted] weeks gestation of pregnancy ?- Routine OB care - did not get GBS swab, sent pt straight to MAU for PEC labs and monitoring ? ?3. History of pre-eclampsia in prior pregnancy, currently pregnant ?- Having headaches, nausea, blurred vision and BP >160/110 at home when she has her afternoon spells. ?- IOL scheduled for Friday AM, orders placed but pt will also go over to MAU for evaluation. MAU team notified. ? ?Preterm labor symptoms and general obstetric precautions including but not limited to vaginal bleeding, contractions, leaking of fluid and fetal movement were reviewed in detail with the patient. ?Please refer to After Visit Summary for other counseling recommendations.  ? ?Return for PP. ? ?Future Appointments  ?Date Time Provider DeKing George?07/27/2021 12:00 AM [facility] SCHED ROOM [facility] None  ? ?JaGabriel CarinaCNM ?

## 2021-07-25 NOTE — MAU Provider Note (Signed)
?History  ?  ? ?CSN: [id number] ? ?Arrival date and time: 07/25/21 1702 ? ? None  ?  ? ?Chief Complaint  ?Patient presents with  ? Hypertension  ? ?HPI ?[name] [name] is a 41 y.o. [id number] at 32w5dwho presents to MAU for elevated BP and headache. This pregnancy has been complicated by gHTN and has PMHx notable for severe pre-eclampsia and renal carcinoma. She was sent from the office for preeclampsia work up. She reports she has had ongoing headaches, but most recent headache started this morning. She took 2 ES Tylenol around 0800 which did help relieve her headache some. She currently rates her headache. She also reports having some blurry vision for several hours today. She reports that this has also been ongoing for several weeks and usually worsens when she has a headache. She reports an overall sense of "feeling icky" and having some nausea. She denies RUQ/epigastric/chest pain or swelling. Reports BH contractions all day, but nothing regular. She denies leaking fluid or vaginal bleeding. Endorses active fetal movement. Patient is scheduled for IOL this Friday, 5/12. ? ?OB History   ? ? Gravida  ?6  ? Para  ?2  ? Term  ?2  ? Preterm  ?   ? AB  ?3  ? Living  ?2  ?  ? ? SAB  ?3  ? IAB  ?   ? Ectopic  ?   ? Multiple  ?0  ? Live Births  ?2  ?   ?  ?  ? ? ?Past Medical History:  ?Diagnosis Date  ? ADHD   ? Anemia   ? Anxiety   ? Depression   ? Gestational hypertension 2017  ? Headache   ? Migraines  ? History of kidney stones   ? Kidney infection 2009  ? Preeclampsia, severe, third trimester 05/19/2019  ? Noted on arrival to MAU  ? Renal mass   ? ? ?Past Surgical History:  ?Procedure Laterality Date  ? DILATION AND EVACUATION N/A 11/27/2017  ? Procedure: DILATATION AND EVACUATION;  Surgeon: AOsborne Oman MD;  [facility];  Service: Gynecology;  Laterality: N/A;  ? FEMUR FRACTURE SURGERY Left   ? metal rod  ? LEG SURGERY Left 1999  ? steel rod in left femur bone   ? ROBOT ASSISTED LAPAROSCOPIC NEPHRECTOMY Right  07/21/2020  ? Procedure: XI ROBOTIC ASSISTED LAPAROSCOPIC RADICAL NEPHRECTOMY;  Surgeon: WCeasar Mons MD;  Location: [facility];  Service: Urology;  Laterality: Right;  ? ? ?Family History  ?Problem Relation Age of Onset  ? Hyperlipidemia Mother   ? Heart disease Maternal Aunt   ? Heart disease Maternal Uncle   ? Heart disease Paternal Aunt   ? Heart disease Maternal Grandfather   ? Heart disease Paternal Grandfather   ? ? ?Social History  ? ?Tobacco Use  ? Smoking status: Never  ? Smokeless tobacco: Never  ?Vaping Use  ? Vaping Use: Never used  ?Substance Use Topics  ? Alcohol use: Not Currently  ? Drug use: No  ? ? ?Allergies:  ?Allergies  ?Allergen Reactions  ? Lisinopril Hives and Itching  ?  Hives on face,ears and neck  ? Sulfa Antibiotics Hives  ? Bactrim [Sulfamethoxazole-Trimethoprim] Hives and Swelling  ? Dust Mite Extract Hives  ? Penicillins Other (See Comments)  ?  Fever-pt was really sick. ?Has patient had a PCN reaction causing immediate rash, facial/tongue/throat swelling, SOB or lightheadedness with hypotension: No ?Has patient had a PCN reaction causing severe  rash involving mucus membranes or skin necrosis: No ?Has patient had a PCN reaction that required hospitalization Yes ?Has patient had a PCN reaction occurring within the last 10 years: No ?If all of the above answers are "NO", then may proceed with Cephalosporin use. ?  ? ? ?Medications Prior to Admission  ?Medication Sig Dispense Refill Last Dose  ? acetaminophen (TYLENOL) 500 MG tablet Take 1,000 mg by mouth every 6 (six) hours as needed.   07/25/2021 at 0800  ? famotidine (PEPCID) 20 MG tablet Take 1 tablet (20 mg total) by mouth 2 (two) times daily. 60 tablet 3 07/25/2021 at 1400  ? Magnesium 400 MG TABS Take 1 tablet by mouth daily. May increase to 2 tablets daily if headaches persist. 60 tablet 2 07/25/2021 at 0800  ? ondansetron (ZOFRAN ODT) 4 MG disintegrating tablet Take 1 tablet (4 mg total) by mouth every 8 (eight) hours as  needed for nausea or vomiting. 20 tablet 3 07/25/2021 at 0800  ? sertraline (ZOLOFT) 25 MG tablet Take 1 tablet (25 mg total) by mouth daily. 30 tablet 11 07/24/2021 at 0800  ? ferrous sulfate (FERROUSUL) 325 (65 FE) MG tablet Take 1 tablet (325 mg total) by mouth every other day. 30 tablet 3   ? fluticasone (FLONASE) 50 MCG/ACT nasal spray Place into both nostrils daily.     ? Prenatal MV-Min-Fe Fum-FA-DHA (PRENATAL 1 PO) Take 1 tablet by mouth daily.     ? promethazine (PHENERGAN) 25 MG tablet Take 1 tablet (25 mg total) by mouth every 6 (six) hours as needed for nausea or vomiting. 30 tablet 0   ? ?Review of Systems  ?Constitutional: Negative.   ?Eyes:  Positive for visual disturbance.  ?     Blurry vision  ?Respiratory: Negative.    ?Cardiovascular: Negative.   ?Gastrointestinal:  Positive for nausea. Negative for abdominal pain and vomiting.  ?Genitourinary: Negative.   ?Neurological:  Positive for headaches.  ?Psychiatric/Behavioral: Negative.    ?Physical Exam  ? ?Patient Vitals for the past 24 hrs: ? BP Temp Temp src Pulse Resp SpO2  ?07/25/21 1945 133/83 -- -- 72 -- --  ?07/25/21 1930 128/86 -- -- 72 -- --  ?07/25/21 1915 124/88 -- -- 79 -- --  ?07/25/21 1900 133/89 -- -- 72 -- 99 %  ?07/25/21 1845 (!) 135/94 -- -- 73 -- 100 %  ?07/25/21 1830 (!) 129/92 -- -- 75 -- 99 %  ?07/25/21 1815 (!) 146/89 -- -- 72 -- 100 %  ?07/25/21 1800 122/79 -- -- 75 -- 100 %  ?07/25/21 1750 -- -- -- -- -- 100 %  ?07/25/21 1745 131/85 -- -- 80 -- 100 %  ?07/25/21 1742 (!) 143/88 98.2 ?F (36.8 ?C) Oral 84 18 100 %  ? ?Physical Exam ?Vitals and nursing note reviewed.  ?Constitutional:   ?   General: She is not in acute distress. ?Eyes:  ?   Pupils: Pupils are equal, round, and reactive to light.  ?Cardiovascular:  ?   Rate and Rhythm: Normal rate.  ?Pulmonary:  ?   Effort: Pulmonary effort is normal.  ?Abdominal:  ?   Palpations: Abdomen is soft.  ?   Tenderness: There is no abdominal tenderness.  ?   Comments: gravid   ?Musculoskeletal:     ?   General: Normal range of motion.  ?   Cervical back: Normal range of motion.  ?Skin: ?   General: Skin is warm and dry.  ?Neurological:  ?   General:  No focal deficit present.  ?   Mental Status: She is alert and oriented to person, place, and time.  ?Psychiatric:     ?   Mood and Affect: Mood normal.     ?   Behavior: Behavior normal.     ?   Thought Content: Thought content normal.     ?   Judgment: Judgment normal.  ? ?NST ?FHR: 140 bpm, moderate variability, +15x15 accels, no decels ?Toco: quiet ? ?MAU Course  ?Procedures ? ?MDM ?Serial BP's normotensive to mild range. CBC, CMP and UPCR ordered. Labs overall unremarkable and reassuring. Creatinine slightly elevated at 1.1 compared to previous result. Headache completely resolved after 1g Tylenol PO. Patient was also given Reglan for nausea which improved as well. Patient still reporting blurry vision, but again this has been ongoing for several weeks. I reviewed patient and labs with Dr. Elgie Congo. At this time, do not need to admit for IOL. Patient stable for discharge home with strict return precautions. She was instructed to return tomorrow night at midnight for IOL. NST reactive and reassuring, toco quiet.  ? ?Assessment and Plan  ?[redacted] weeks gestation of pregnancy ?Gestational hypertension ? ?- Discharge home in stable condition ?- Strict return precautions reviewed. Return to MAU sooner for worsening symptoms ?- Return on 07/26/21 at 11:45pm for IOL ? ? ?Renee Harder, CNM ?07/25/2021, 8:16 PM  ?

## 2021-07-26 ENCOUNTER — Encounter (HOSPITAL_COMMUNITY): Payer: Self-pay | Admitting: *Deleted

## 2021-07-26 ENCOUNTER — Telehealth (HOSPITAL_COMMUNITY): Payer: Self-pay | Admitting: *Deleted

## 2021-07-26 NOTE — Telephone Encounter (Signed)
Preadmission screen  

## 2021-07-27 ENCOUNTER — Inpatient Hospital Stay (HOSPITAL_COMMUNITY): Payer: 59

## 2021-07-27 ENCOUNTER — Inpatient Hospital Stay (HOSPITAL_COMMUNITY): Payer: 59 | Admitting: Anesthesiology

## 2021-07-27 ENCOUNTER — Other Ambulatory Visit: Payer: Self-pay

## 2021-07-27 ENCOUNTER — Encounter (HOSPITAL_COMMUNITY): Payer: Self-pay | Admitting: Family Medicine

## 2021-07-27 ENCOUNTER — Inpatient Hospital Stay (HOSPITAL_COMMUNITY)
Admission: AD | Admit: 2021-07-27 | Discharge: 2021-07-29 | DRG: 806 | Disposition: A | Discharge status: Home / Self Care | Payer: 59 | Attending: Obstetrics & Gynecology | Admitting: Obstetrics & Gynecology

## 2021-07-27 DIAGNOSIS — Z3A37 37 weeks gestation of pregnancy: Secondary | ICD-10-CM

## 2021-07-27 DIAGNOSIS — Z85528 Personal history of other malignant neoplasm of kidney: Secondary | ICD-10-CM

## 2021-07-27 DIAGNOSIS — O99892 Other specified diseases and conditions complicating childbirth: Secondary | ICD-10-CM | POA: Diagnosis not present

## 2021-07-27 DIAGNOSIS — Z880 Allergy status to penicillin: Secondary | ICD-10-CM

## 2021-07-27 DIAGNOSIS — Z905 Acquired absence of kidney: Secondary | ICD-10-CM

## 2021-07-27 DIAGNOSIS — N179 Acute kidney failure, unspecified: Secondary | ICD-10-CM | POA: Diagnosis present

## 2021-07-27 DIAGNOSIS — O26833 Pregnancy related renal disease, third trimester: Secondary | ICD-10-CM | POA: Diagnosis present

## 2021-07-27 DIAGNOSIS — O1414 Severe pre-eclampsia complicating childbirth: Secondary | ICD-10-CM | POA: Diagnosis present

## 2021-07-27 LAB — TYPE AND SCREEN
ABO/RH(D): B POS
Antibody Screen: NEGATIVE

## 2021-07-27 LAB — COMPREHENSIVE METABOLIC PANEL
ALT: 12 U/L (ref 0–44)
AST: 19 U/L (ref 15–41)
Albumin: 2.8 g/dL — ABNORMAL LOW (ref 3.5–5.0)
Alkaline Phosphatase: 97 U/L (ref 38–126)
Anion gap: 8 (ref 5–15)
BUN: 13 mg/dL (ref 6–20)
CO2: 20 mmol/L — ABNORMAL LOW (ref 22–32)
Calcium: 8.8 mg/dL — ABNORMAL LOW (ref 8.9–10.3)
Chloride: 107 mmol/L (ref 98–111)
Creatinine, Ser: 1.07 mg/dL — ABNORMAL HIGH (ref 0.44–1.00)
GFR, Estimated: >60 mL/min (ref >60)
Glucose, Bld: 91 mg/dL (ref 70–99)
Potassium: 4 mmol/L (ref 3.5–5.1)
Sodium: 135 mmol/L (ref 135–145)
Total Bilirubin: 0.2 mg/dL — ABNORMAL LOW (ref 0.3–1.2)
Total Protein: 5.9 g/dL — ABNORMAL LOW (ref 6.5–8.1)

## 2021-07-27 LAB — CBC
HCT: 31.9 % — ABNORMAL LOW (ref 36.0–46.0)
HCT: 34.4 % — ABNORMAL LOW (ref 36.0–46.0)
Hemoglobin: 11.1 g/dL — ABNORMAL LOW (ref 12.0–15.0)
Hemoglobin: 11.7 g/dL — ABNORMAL LOW (ref 12.0–15.0)
MCH: 31.9 pg (ref 26.0–34.0)
MCH: 31 pg (ref 26.0–34.0)
MCHC: 34.8 g/dL (ref 30.0–36.0)
MCHC: 34 g/dL (ref 30.0–36.0)
MCV: 91.7 fL (ref 80.0–100.0)
MCV: 91 fL (ref 80.0–100.0)
Platelets: 182 10*3/uL (ref 150–400)
Platelets: 205 10*3/uL (ref 150–400)
RBC: 3.48 MIL/uL — ABNORMAL LOW (ref 3.87–5.11)
RBC: 3.78 MIL/uL — ABNORMAL LOW (ref 3.87–5.11)
RDW: 13.9 % (ref 11.5–15.5)
RDW: 13.8 % (ref 11.5–15.5)
WBC: 6.3 10*3/uL (ref 4.0–10.5)
WBC: 9.2 10*3/uL (ref 4.0–10.5)
nRBC: 0 % (ref 0.0–0.2)
nRBC: 0 % (ref 0.0–0.2)

## 2021-07-27 LAB — MAGNESIUM: Magnesium: 5.3 mg/dL — ABNORMAL HIGH (ref 1.7–2.4)

## 2021-07-27 LAB — PROTEIN / CREATININE RATIO, URINE
Creatinine, Urine: 121.15 mg/dL
Protein Creatinine Ratio: 0.4 mg/mg{Cre} — ABNORMAL HIGH (ref 0.00–0.15)
Total Protein, Urine: 48 mg/dL

## 2021-07-27 LAB — GROUP B STREP BY PCR: Group B strep by PCR: NEGATIVE

## 2021-07-27 LAB — RPR: RPR Ser Ql: NONREACTIVE

## 2021-07-27 MED ORDER — LACTATED RINGERS IV SOLN: INTRAVENOUS | Status: DC

## 2021-07-27 MED ORDER — FENTANYL CITRATE (PF) 100 MCG/2ML IJ SOLN
100.0000 ug | INTRAMUSCULAR | Status: DC | PRN
  Administered 2021-07-27 (×2): 100 ug via INTRAVENOUS
  Filled 2021-07-27 (×2): qty 2

## 2021-07-27 MED ORDER — CALCIUM CARBONATE ANTACID 500 MG PO CHEW
200.0000 mg | CHEWABLE_TABLET | ORAL | Status: DC | PRN
  Administered 2021-07-27: 200 mg via ORAL
  Filled 2021-07-27: qty 1

## 2021-07-27 MED ORDER — MAGNESIUM SULFATE BOLUS VIA INFUSION
4.0000 g | Freq: Once | INTRAVENOUS | Status: AC
  Administered 2021-07-27: 4 g via INTRAVENOUS
  Filled 2021-07-27: qty 1000

## 2021-07-27 MED ORDER — OXYTOCIN BOLUS FROM INFUSION
333.0000 mL | Freq: Once | INTRAVENOUS | Status: AC
  Administered 2021-07-27: 333 mL via INTRAVENOUS

## 2021-07-27 MED ORDER — HYDRALAZINE HCL 20 MG/ML IJ SOLN: 10.0000 mg | INTRAMUSCULAR | Status: DC | PRN

## 2021-07-27 MED ORDER — LABETALOL HCL 5 MG/ML IV SOLN: 40.0000 mg | INTRAVENOUS | Status: DC | PRN

## 2021-07-27 MED ORDER — LIDOCAINE HCL (PF) 1 % IJ SOLN
INTRAMUSCULAR | Status: DC | PRN
  Administered 2021-07-27: 10 mL via EPIDURAL

## 2021-07-27 MED ORDER — FENTANYL-BUPIVACAINE-NACL 0.5-0.125-0.9 MG/250ML-% EP SOLN
12.0000 mL/h | EPIDURAL | Status: DC | PRN
  Administered 2021-07-27: 12 mL/h via EPIDURAL
  Filled 2021-07-27: qty 250

## 2021-07-27 MED ORDER — HYDROXYZINE HCL 50 MG PO TABS: 50.0000 mg | ORAL_TABLET | Freq: Four times a day (QID) | ORAL | Status: DC | PRN

## 2021-07-27 MED ORDER — ONDANSETRON HCL 4 MG/2ML IJ SOLN
4.0000 mg | Freq: Four times a day (QID) | INTRAMUSCULAR | Status: DC | PRN
  Administered 2021-07-27 (×2): 4 mg via INTRAVENOUS
  Filled 2021-07-27 (×2): qty 2

## 2021-07-27 MED ORDER — EPHEDRINE 5 MG/ML INJ: 10.0000 mg | INTRAVENOUS | Status: DC | PRN

## 2021-07-27 MED ORDER — MISOPROSTOL 50MCG HALF TABLET
50.0000 ug | ORAL_TABLET | ORAL | Status: DC
  Administered 2021-07-27 (×2): 50 ug via ORAL
  Filled 2021-07-27 (×2): qty 1

## 2021-07-27 MED ORDER — LACTATED RINGERS IV SOLN
500.0000 mL | Freq: Once | INTRAVENOUS | Status: AC
  Administered 2021-07-27: 500 mL via INTRAVENOUS

## 2021-07-27 MED ORDER — OXYTOCIN-SODIUM CHLORIDE 30-0.9 UT/500ML-% IV SOLN
2.5000 [IU]/h | INTRAVENOUS | Status: DC
  Administered 2021-07-27: 2.5 [IU]/h via INTRAVENOUS
  Filled 2021-07-27: qty 500

## 2021-07-27 MED ORDER — LACTATED RINGERS IV SOLN
500.0000 mL | INTRAVENOUS | Status: DC | PRN
  Administered 2021-07-27: 1000 mL via INTRAVENOUS

## 2021-07-27 MED ORDER — MAGNESIUM SULFATE 40 GM/1000ML IV SOLN
1.0000 g/h | INTRAVENOUS | Status: DC
  Administered 2021-07-27: 2 g/h via INTRAVENOUS
  Filled 2021-07-27: qty 1000

## 2021-07-27 MED ORDER — PHENYLEPHRINE 80 MCG/ML (10ML) SYRINGE FOR IV PUSH (FOR BLOOD PRESSURE SUPPORT)
80.0000 ug | PREFILLED_SYRINGE | INTRAVENOUS | Status: DC | PRN
  Filled 2021-07-27: qty 10

## 2021-07-27 MED ORDER — LABETALOL HCL 5 MG/ML IV SOLN
20.0000 mg | INTRAVENOUS | Status: DC | PRN
  Administered 2021-07-27: 20 mg via INTRAVENOUS
  Filled 2021-07-27: qty 4

## 2021-07-27 MED ORDER — LIDOCAINE HCL (PF) 1 % IJ SOLN
30.0000 mL | INTRAMUSCULAR | Status: DC | PRN
Start: 2021-07-27 — End: 2021-07-28

## 2021-07-27 MED ORDER — LABETALOL HCL 5 MG/ML IV SOLN: 80.0000 mg | INTRAVENOUS | Status: DC | PRN

## 2021-07-27 MED ORDER — TERBUTALINE SULFATE 1 MG/ML IJ SOLN
0.2500 mg | Freq: Once | INTRAMUSCULAR | Status: AC | PRN
  Administered 2021-07-27: 0.25 mg via SUBCUTANEOUS
  Filled 2021-07-27: qty 1

## 2021-07-27 MED ORDER — PHENYLEPHRINE 80 MCG/ML (10ML) SYRINGE FOR IV PUSH (FOR BLOOD PRESSURE SUPPORT)
80.0000 ug | PREFILLED_SYRINGE | INTRAVENOUS | Status: AC | PRN
  Administered 2021-07-27 (×3): 80 ug via INTRAVENOUS
  Filled 2021-07-27: qty 10

## 2021-07-27 MED ORDER — SOD CITRATE-CITRIC ACID 500-334 MG/5ML PO SOLN: 30.0000 mL | ORAL | Status: DC | PRN

## 2021-07-27 MED ORDER — ACETAMINOPHEN 325 MG PO TABS
650.0000 mg | ORAL_TABLET | ORAL | Status: DC | PRN
  Administered 2021-07-27 – 2021-07-28 (×2): 650 mg via ORAL
  Filled 2021-07-27 (×2): qty 2

## 2021-07-27 MED ORDER — FLEET ENEMA 7-19 GM/118ML RE ENEM: 1.0000 | ENEMA | RECTAL | Status: DC | PRN

## 2021-07-27 MED ORDER — DIPHENHYDRAMINE HCL 50 MG/ML IJ SOLN: 12.5000 mg | INTRAMUSCULAR | Status: DC | PRN

## 2021-07-27 NOTE — Progress Notes (Incomplete)
LABOR PROGRESS NOTE ? ?Subjective: Patient reports decreasing anxiety levels and less discomfort. Reports HA is  ? ?Objective: ?Blood pressure (!) 138/94, pulse 63, temperature 97.7 ?F (36.5 ?C), temperature source Oral, resp. rate 18, height 5' 0.5" (1.537 m), weight 77.6 kg, last menstrual period 11/15/2020, SpO2 100 %, unknown if currently breastfeeding. ?Temp (24hrs), Avg:97.8 ?F (36.6 ?C), Min:97.7 ?F (36.5 ?C), Max:97.9 ?F (36.6 ?C) ? ? ?Fetal Heart Tones ?Baseline: *** ?Variability: {obgyn fetal heart variability:312973} ?Accelerations: {IP OB FHT ACCLERATIONS:22473} ?Decelerations: {IP OB FHT DECELERATIONS:22474} ?Overall assessment: {NICHD CATEGORIES:26300} ? ?Contractions: {IP OB PROGRESS NOTE CONTRACT:22475} ? ?Cervical Exam: {IP OB PROGRESS STERILE VAG:22476}  ? ?ASSESSMENT AND PLAN:   ? ?Electronically Signed By: ?Greggory Keen, SNM ?07/27/21 10:04 PM   ?

## 2021-07-27 NOTE — Progress Notes (Signed)
Labor Progress Note ?[name] [name] is a 41 y.o. [id number] at 23w0dpresented for IOL for PEC w SF (HA/PCr) ? ?S:  ?To bedside for fetal deceleration, pt very vocal about feeling nauseated and dizzy. BP down after labetalol. Pt calm by the time BP recovered. ? ?O: 85/47, 116/77, 132/85 ?BP 116/77   Pulse (!) 123   Temp 97.7 ?F (36.5 ?C) (Oral)   Resp 17   Ht 5' 0.5" (1.537 m)   Wt 171 lb 1.6 oz (77.6 kg)   LMP 11/15/2020 (Approximate) Comment: pt (-) preg test  11/09/20  SpO2 100%   BMI 32.87 kg/m?  ?EFM: baseline 135 bpm/ moderate variability/ 15x15 accels/ deep variable 228m decel with decreased BP ?Toco/IUPC: q2-2m41m?SVE: 1/thick/posterior  ?Pitocin: none  ?A/P: 40 24o. [id number]. Labor: Latent labor, 2nd dose of cytotec given with q2-2min66mx. Will assess for FB placement at next check. ?2. FWB: Now Cat 1 with improved BP ?3. Pain: increased, planning IV pain meds ?4. PEC: Feeling effects of mag bolus/infusion (nausea, flushed, dizzy and foggy). DTRs+2, no clonus. ? ?Dr. LeggeGala Romneyedside during prolonged decel and pre-consented pt for CS if needed. Terb given, BP resolved and baby began to tolerate contractions which resumed 8min 48mer terb. Anticipate SVD if baby continues to tolerate induction methods. ? ?JamillGaylan Gerold MSN, IBCLC ?Certified Nurse Midwife, [facility]? ?

## 2021-07-27 NOTE — Progress Notes (Signed)
Labor Progress Note ?[name] [name] is a 41 y.o. [id number] at 35w0dpresented for IOL for PEC w SF (HA/PCr) ? ?S:  ?To bedside for FB placement. Pt in bed, still reporting occasional nausea, fogginess, dizziness and intermittent headache.  ? ?O:  ?BP 133/84   Pulse 61   Temp 97.7 ?F (36.5 ?C) (Oral)   Resp 17   Ht 5' 0.5" (1.537 m)   Wt 171 lb 1.6 oz (77.6 kg)   LMP 11/15/2020 (Approximate) Comment: pt (-) preg test  11/09/20  SpO2 100%   BMI 32.87 kg/m?  ?EFM: baseline 135 bpm/ moderate variability/ 15x15 accels/ no decels ?Toco/IUPC: q2-329m ?SVE: 2/50/posterior ?Pitocin: none  ?A/P: [age].o. [id number]. Labor: Latent labor, already having ctx q2-3mi72mso no cytotec given but FB placed easily. ?2. FWB: Cat 1 ?3. Pain: planning epidural ?4. PEC: Feeling effects of mag bolus/infusion (nausea, flushed, dizzy and foggy). DTRs+2, no clonus. Mag level ordered, infusion decreased to 1g/hr. ? ?JamiGaylan GeroldM, MSN, IBCLC ?Certified Nurse Midwife, [facility]  ? ?

## 2021-07-27 NOTE — Discharge Summary (Signed)
? ?  Postpartum Discharge Summary ? ?Date of Service updated ? ?   ?Patient Name: [name] [name] ?DOB: 06-09-1980 ?MRN: [id number] ? ?Date of admission: 07/27/2021 ?Delivery date:07/27/2021  ?Delivering provider: AGBAZA, ONOME S  ?Date of discharge: 07/29/2021 ? ?Admitting diagnosis: Indication for care in labor or delivery [O75.9] ?Intrauterine pregnancy: [redacted]w[redacted]d    ?Secondary diagnosis:  Principal Problem: ?  Indication for care in labor or delivery ?Active Problems: ?  History of right radical nephrectomy ?  History of renal cell carcinoma ?  Hypertension in pregnancy, preeclampsia, severe, delivered ?  [place]ute kidney injury ([place] ? ?Additional problems: None   ?Discharge diagnosis: Term Pregnancy Delivered and Preeclampsia (severe)                                              ?Post partum procedures: Magnesium ?Augmentation: AROM, Cytotec, and IP Foley ?Complications: None ? ?Hospital course: Induction of Labor With Vaginal Delivery   ?41 y.o. yo [id number] 323w0das admitted to the hospital 07/27/2021 for induction of labor.  Indication for induction: Preeclampsia.  Patient had an uncomplicated labor course as follows: ?Membrane Rupture Time/Date: 9:45 PM ,07/27/2021   ?Delivery Method:Vaginal, Spontaneous  ?Episiotomy: None  ?Lacerations:  None  ?Details of delivery can be found in separate delivery note.   ? ?Patient was on Magnesium for about 24 hours following delivery. It was stopped a little bit early due to creatinine and Mag level. After Mg was off, the patient felt better and her HA resolved. She otherwise had a routine postpartum course. Her creatinine on day of discharge was stable at 1.12. We will continue to follow this after delivery - will have her not continue ibuprofen at home. Reviewed procardia and lasix for home with patient. Patient was enrolled in postpartum babyscripts prior to discharge. Patient is discharged home 07/29/21. ? ?Newborn Data: ?Birth date:07/27/2021  ?Birth time:10:22 PM  ?Gender:Female   ?Living status:Living  ?Apgars:3 ,8  ?Weight:2620 g  ? ?Magnesium Sulfate received: No ?BMZ received: No ?Rhophylac:No ?MMR:No ?T-DaP:Given prenatally ?Flu: No ?Transfusion:No ? ?Physical exam  ?Vitals:  ? 07/28/21 2125 07/29/21 0015 07/29/21 0350 07/29/21 0805  ?BP: (!) 145/83 135/83 131/87 (!) 148/80  ?Pulse: (!) 59 71 85 67  ?Resp:  '18 18 17  ' ?Temp:  98.3 ?F (36.8 ?C) 98.4 ?F (36.9 ?C) 98.3 ?F (36.8 ?C)  ?TempSrc:  Oral Oral Oral  ?SpO2:  100% 100% 100%  ?Weight:      ?Height:      ? ?General: alert, cooperative, and no distress ?Lochia: appropriate ?Uterine Fundus: firm ?Incision: N/A ?DVT Evaluation: No significant calf/ankle edema. ?Labs: ?Lab Results  ?Component Value Date  ? WBC 11.9 (H) 07/28/2021  ? HGB 11.9 (L) 07/28/2021  ? HCT 34.4 (L) 07/28/2021  ? MCV 91.2 07/28/2021  ? PLT 191 07/28/2021  ? ? ?  Latest Ref Rng & Units 07/29/2021  ?  1:20 AM  ?CMP  ?Glucose 70 - 99 mg/dL 125    ?BUN 6 - 20 mg/dL 10    ?Creatinine 0.44 - 1.00 mg/dL 1.12    ?Sodium 135 - 145 mmol/L 135    ?Potassium 3.5 - 5.1 mmol/L 4.0    ?Chloride 98 - 111 mmol/L 107    ?CO2 22 - 32 mmol/L 21    ?Calcium 8.9 - 10.3 mg/dL 7.5    ? ?Edinburgh Score: ? ?  07/29/2021  ?  8:05 AM  ?Flavia Shipper Postnatal Depression Scale Screening Tool  ?I have been able to laugh and see the funny side of things. 0  ?I have looked forward with enjoyment to things. 0  ?I have blamed myself unnecessarily when things went wrong. 0  ?I have been anxious or worried for no good reason. 0  ?I have felt scared or panicky for no good reason. 1  ?Things have been getting on top of me. 1  ?I have been so unhappy that I have had difficulty sleeping. 0  ?I have felt sad or miserable. 0  ?I have been so unhappy that I have been crying. 0  ?The thought of harming myself has occurred to me. 0  ?Edinburgh Postnatal Depression Scale Total 2  ? ? ? ?After visit meds:  ?Allergies as of 07/29/2021   ? ?   Reactions  ? Lisinopril Hives, Itching  ? Hives on face,ears and neck  ?  Sulfa Antibiotics Hives  ? Bactrim [sulfamethoxazole-trimethoprim] Hives, Swelling  ? Dust Mite Extract Hives  ? Penicillins Other (See Comments)  ? Fever-pt was really sick. ?Has patient had a PCN reaction causing immediate rash, facial/tongue/throat swelling, SOB or lightheadedness with hypotension: No ?Has patient had a PCN reaction causing severe rash involving mucus membranes or skin necrosis: No ?Has patient had a PCN reaction that required hospitalization Yes ?Has patient had a PCN reaction occurring within the last 10 years: No ?If all of the above answers are "NO", then may proceed with Cephalosporin use.  ? ?  ? ?  ?Medication List  ?  ? ?STOP taking these medications   ? ?famotidine 20 MG tablet ?Commonly known as: Pepcid ?  ?ferrous sulfate 325 (65 FE) MG tablet ?Commonly known as: FerrouSul ?  ?Magnesium 400 MG Tabs ?  ?ondansetron 4 MG disintegrating tablet ?Commonly known as: Zofran ODT ?  ?promethazine 25 MG tablet ?Commonly known as: PHENERGAN ?  ? ?  ? ?TAKE these medications   ? ?acetaminophen 325 MG tablet ?Commonly known as: Tylenol ?Take 2 tablets (650 mg total) by mouth every 4 (four) hours as needed (for pain scale < 4). ?What changed:  ?medication strength ?how much to take ?when to take this ?reasons to take this ?  ?fluticasone 50 MCG/ACT nasal spray ?Commonly known as: FLONASE ?Place into both nostrils daily. ?  ?furosemide 20 MG tablet ?Commonly known as: LASIX ?Take 1 tablet (20 mg total) by mouth daily for 5 days. ?Start taking on: Jul 30, 2021 ?  ?NIFEdipine 60 MG 24 hr tablet ?Commonly known as: ADALAT CC ?Take 1 tablet (60 mg total) by mouth daily. ?Start taking on: Jul 30, 2021 ?  ?oxyCODONE 5 MG immediate release tablet ?Commonly known as: Oxy IR/ROXICODONE ?Take 1 tablet (5 mg total) by mouth every 4 (four) hours as needed (pain scale 4-7). ?  ?PRENATAL 1 PO ?Take 1 tablet by mouth daily. ?  ?sertraline 25 MG tablet ?Commonly known as: Zoloft ?Take 1 tablet (25 mg total) by mouth  daily. ?  ? ?  ? ? ? ?Discharge home in stable condition ?Infant Feeding: Breast ?Infant Disposition:rooming in ?Discharge instruction: per After Visit Summary and Postpartum booklet. ?Activity: Advance as tolerated. Pelvic rest for 6 weeks.  ?Diet: low salt diet ?Future Appointments:No future appointments. ?Follow up Visit: ? Follow-up Information   ? ? [facility] Follow up in 1 week(s).   ?Specialty: Obstetrics and Gynecology ?  Why: Blood pressure check in the office. ?Contact information: ?[place] ?[place] [id number] ?[phone] ? ?  ?  ? ?  ?  ? ?  ? ? ?Message sent to [facility] by Dr Higinio Plan  ?Please schedule this patient for a In person postpartum visit in 6 weeks with the following provider:  Any provider, preference for [facility] . ?Additional Postpartum F/U:BP check 1 week  ?High risk pregnancy complicated by:  pre-e w/ SF, h/o renal cancer s/p nephrectomy ?Delivery mode:  Vaginal, Spontaneous  ?Anticipated Birth Control:   Partner Vasectomy  ? ? ?07/29/2021 ?Radene Gunning, MD ? ? ? ?

## 2021-07-27 NOTE — Anesthesia Procedure Notes (Signed)
Epidural ?Patient location during procedure: OB ?Start time: 07/27/2021 7:35 PM ?End time: 07/27/2021 7:40 PM ? ?Staffing ?Anesthesiologist: Lyn Hollingshead, MD ? ?Preanesthetic Checklist ?Completed: patient identified, IV checked, site marked, risks and benefits discussed, surgical consent, monitors and equipment checked, pre-op evaluation and timeout performed ? ?Epidural ?Patient position: sitting ?Prep: DuraPrep and site prepped and draped ?Patient monitoring: continuous pulse ox and blood pressure ?Approach: midline ?Location: L3-L4 ?Injection technique: LOR air ? ?Needle:  ?Needle type: Tuohy  ?Needle gauge: 17 G ?Needle length: 9 cm and 9 ?Needle insertion depth: 6 cm ?Catheter type: closed end flexible ?Catheter size: 19 Gauge ?Catheter at skin depth: 11 cm ?Test dose: negative and Other ? ?Assessment ?Events: blood not aspirated, injection not painful, no injection resistance, no paresthesia and negative IV test ? ?Additional Notes ?Reason for block:procedure for pain ? ? ? ?

## 2021-07-27 NOTE — H&P (Addendum)
OBSTETRIC ADMISSION HISTORY AND PHYSICAL ? ?[name] [name] is a 41 y.o. female [phone] with IUP at 61w0dpresenting for IOL for PEC w SF (PCr/HA). She reports +FMs. No LOF, VB, peripheral edema, or RUQ pain. Had a HA when she got up this morning, that has resolved without intervention. Does complain of almost constant blurry vision but no photophobia or black spots. She plans on breastfeeding. Her husband is planning a vasectomy for birth control. ? ?Dating: LMP c/w U/S at 12w/d --->  Estimated Date of Delivery: 08/17/21 ? ?Sono on 06/08/21 '@[redacted]w[redacted]d'$ : normal anatomy, cephalic presentation, 13009Q 26%ile, EFW 3lb3oz ? ?Prenatal History/Complications: ?Pregnancy uncomplicated until 313w6dhen pt developed gHTN. PEC w SF diagnosed on admission for IOL.  ? ?Past Medical History: ?Past Medical History:  ?Diagnosis Date  ? ADHD   ? Anemia   ? Anxiety   ? Depression   ? Gestational hypertension 2017  ? Headache   ? Migraines  ? History of kidney stones   ? Kidney infection 2009  ? Perinatal depression   ? Preeclampsia, severe, third trimester 05/19/2019  ? Noted on arrival to [facility]  ? Renal mass   ? ?Past Surgical History: ?Past Surgical History:  ?Procedure Laterality Date  ? DILATION AND EVACUATION N/A 11/27/2017  ? Procedure: DILATATION AND EVACUATION;  Surgeon: AnOsborne OmanMD;  [facility];  Service: Gynecology;  Laterality: N/A;  ? FEMUR FRACTURE SURGERY Left   ? metal rod  ? LEG SURGERY Left 1999  ? steel rod in left femur bone   ? ROBOT ASSISTED LAPAROSCOPIC NEPHRECTOMY Right 07/21/2020  ? Procedure: XI ROBOTIC ASSISTED LAPAROSCOPIC RADICAL NEPHRECTOMY;  Surgeon: WiCeasar MonsMD;  Location: [facility];  Service: Urology;  Laterality: Right;  ? ?Obstetrical History: ?OB History   ? ? Gravida  ?6  ? Para  ?2  ? Term  ?2  ? Preterm  ?   ? AB  ?3  ? Living  ?2  ?  ? ? SAB  ?3  ? IAB  ?   ? Ectopic  ?   ? Multiple  ?0  ? Live Births  ?2  ?   ?  ?  ? ?Social History: ?Social History  ? ?Socioeconomic History  ?  Marital status: Married  ?  Spouse name: JoBroadus John? Number of children: 2  ? Years of education: college  ? Highest education level: Master's degree (e.g., MA, MS, MEng, MEd, MSW, MBA)  ?Occupational History  ? Occupation: Social Work  ?  Comment: [employer]  ?Tobacco Use  ? Smoking status: Never  ? Smokeless tobacco: Never  ?Vaping Use  ? Vaping Use: Never used  ?Substance and Sexual Activity  ? Alcohol use: Not Currently  ? Drug use: No  ? Sexual activity: Not Currently  ?Other Topics Concern  ? Not on file  ?Social History Narrative  ? Lives with spouse, child  ? Caffeine- 4 x weekly, 8 oz c coffee  ? ?Social Determinants of Health  ? ?Financial Resource Strain: Not on file  ?Food Insecurity: No Food Insecurity  ? Worried About RuCharity fundraisern the Last Year: Never true  ? Ran Out of Food in the Last Year: Never true  ?Transportation Needs: No Transportation Needs  ? Lack of Transportation (Medical): No  ? Lack of Transportation (Non-Medical): No  ?Physical Activity: Not on file  ?Stress: Not on file  ?Social Connections: Not on file  ? ?Family History: ?Family History  ?  Problem Relation Age of Onset  ? Kidney disease Mother   ? Hyperlipidemia Mother   ? Heart disease Maternal Aunt   ? Heart disease Maternal Uncle   ? Heart disease Paternal Aunt   ? Heart disease Maternal Grandfather   ? Heart disease Paternal Grandfather   ? ?Allergies: ?Allergies  ?Allergen Reactions  ? Lisinopril Hives and Itching  ?  Hives on face,ears and neck  ? Sulfa Antibiotics Hives  ? Bactrim [Sulfamethoxazole-Trimethoprim] Hives and Swelling  ? Dust Mite Extract Hives  ? Penicillins Other (See Comments)  ?  Fever-pt was really sick. ?Has patient had a PCN reaction causing immediate rash, facial/tongue/throat swelling, SOB or lightheadedness with hypotension: No ?Has patient had a PCN reaction causing severe rash involving mucus membranes or skin necrosis: No ?Has patient had a PCN reaction that required hospitalization  Yes ?Has patient had a PCN reaction occurring within the last 10 years: No ?If all of the above answers are "NO", then may proceed with Cephalosporin use. ?  ? ?Medications Prior to Admission  ?Medication Sig Dispense Refill Last Dose  ? acetaminophen (TYLENOL) 500 MG tablet Take 1,000 mg by mouth every 6 (six) hours as needed.     ? famotidine (PEPCID) 20 MG tablet Take 1 tablet (20 mg total) by mouth 2 (two) times daily. 60 tablet 3   ? ferrous sulfate (FERROUSUL) 325 (65 FE) MG tablet Take 1 tablet (325 mg total) by mouth every other day. 30 tablet 3   ? fluticasone (FLONASE) 50 MCG/ACT nasal spray Place into both nostrils daily.     ? Magnesium 400 MG TABS Take 1 tablet by mouth daily. May increase to 2 tablets daily if headaches persist. 60 tablet 2   ? ondansetron (ZOFRAN ODT) 4 MG disintegrating tablet Take 1 tablet (4 mg total) by mouth every 8 (eight) hours as needed for nausea or vomiting. 20 tablet 3   ? Prenatal MV-Min-Fe Fum-FA-DHA (PRENATAL 1 PO) Take 1 tablet by mouth daily.     ? promethazine (PHENERGAN) 25 MG tablet Take 1 tablet (25 mg total) by mouth every 6 (six) hours as needed for nausea or vomiting. 30 tablet 0   ? sertraline (ZOLOFT) 25 MG tablet Take 1 tablet (25 mg total) by mouth daily. 30 tablet 11   ? ?Review of Systems: ?All systems reviewed and negative except as stated in HPI ? ?PE: ?Blood pressure (!) 151/83, pulse 76, temperature 97.8 ?F (36.6 ?C), temperature source Oral, resp. rate 18, height 5' 0.5" (1.537 m), weight 171 lb 1.6 oz (77.6 kg), last menstrual period 11/15/2020, unknown if currently breastfeeding. ?General appearance: alert, cooperative, appears stated age, and no distress ?HEENT: PERRLA ?Lungs: regular rate and effort ?Heart: regular rate  ?Abdomen: soft, non-tender ?Extremities: Homans sign is negative, no sign of DVT, brisk reflexes ?Presentation: cephalic ?EFM: 140 bpm, moderate variability, 15x15 accels, one 33mn variable decel that resolved with position  changes ?Toco: UI ?Dilation: Fingertip ?Effacement (%): Thick ?Station: -3 ?Exam by:: LArby Barrette RN ? ?Prenatal labs: ?ABO, Rh: --/--/B POS (05/12 00017 ?Antibody: NEG (05/12 0717) ?Rubella: 4.25 (11/18 1120) ?RPR: Non Reactive (03/24 0852)  ?HBsAg: Negative (11/18 1120)  ?HIV: Non Reactive (03/24 04944  ?GBS:  unknown, rapid ordered ?2 hr GTT normal ? ?Prenatal Transfer Tool  ?Maternal Diabetes: No ?Genetic Screening: Normal ?Maternal Ultrasounds/Referrals: Normal ?Fetal Ultrasounds or other Referrals:  [facility]erred to [facility]  ?Maternal Substance Abuse:  No ?Significant Maternal Medications:  None ?Significant Maternal Lab Results:  None ? ?Results for orders placed or performed during the hospital encounter of 07/27/21 (from the past 24 hour(s))  ?CBC  ? Collection Time: 07/27/21  7:17 AM  ?Result Value Ref Range  ? WBC 6.3 4.0 - 10.5 K/uL  ? RBC 3.48 (L) 3.87 - 5.11 MIL/uL  ? Hemoglobin 11.1 (L) 12.0 - 15.0 g/dL  ? HCT 31.9 (L) 36.0 - 46.0 %  ? MCV 91.7 80.0 - 100.0 fL  ? MCH 31.9 26.0 - 34.0 pg  ? MCHC 34.8 30.0 - 36.0 g/dL  ? RDW 13.9 11.5 - 15.5 %  ? Platelets 182 150 - 400 K/uL  ? nRBC 0.0 0.0 - 0.2 %  ?Protein / creatinine ratio, urine  ? Collection Time: 07/27/21  7:17 AM  ?Result Value Ref Range  ? Creatinine, Urine 121.15 mg/dL  ? Total Protein, Urine 48 mg/dL  ? Protein Creatinine Ratio 0.40 (H) 0.00 - 0.15 mg/mg[Cre]  ?Comprehensive metabolic panel  ? Collection Time: 07/27/21  7:17 AM  ?Result Value Ref Range  ? Sodium 135 135 - 145 mmol/L  ? Potassium 4.0 3.5 - 5.1 mmol/L  ? Chloride 107 98 - 111 mmol/L  ? CO2 20 (L) 22 - 32 mmol/L  ? Glucose, Bld 91 70 - 99 mg/dL  ? BUN 13 6 - 20 mg/dL  ? Creatinine, Ser 1.07 (H) 0.44 - 1.00 mg/dL  ? Calcium 8.8 (L) 8.9 - 10.3 mg/dL  ? Total Protein 5.9 (L) 6.5 - 8.1 g/dL  ? Albumin 2.8 (L) 3.5 - 5.0 g/dL  ? AST 19 15 - 41 U/L  ? ALT 12 0 - 44 U/L  ? Alkaline Phosphatase 97 38 - 126 U/L  ? Total Bilirubin 0.2 (L) 0.3 - 1.2 mg/dL  ? GFR, Estimated >60  >60 mL/min  ? Anion gap 8 5 - 15  ?Type and screen [place]  ? Collection Time: 07/27/21  7:17 AM  ?Result Value Ref Range  ? ABO/RH(D) B POS   ? Antibody Screen NEG   ? Sample Expiration    ?

## 2021-07-27 NOTE — Anesthesia Preprocedure Evaluation (Signed)
Anesthesia Evaluation  ?Patient identified by MRN, date of birth, ID band ?Patient awake ? ? ? ?Reviewed: ?Allergy & Precautions, H&P , Patient's Chart, lab work & pertinent test results ? ?Airway ?Mallampati: II ? ?TM Distance: >3 FB ?Neck ROM: full ? ? ? Dental ?no notable dental hx. ? ?  ?Pulmonary ?neg pulmonary ROS,  ?  ?Pulmonary exam normal ? ? ? ? ? ? ? Cardiovascular ?hypertension, Normal cardiovascular exam ? ? ?  ?Neuro/Psych ?  ? GI/Hepatic ?Neg liver ROS,   ?Endo/Other  ?negative endocrine ROS ? Renal/GU ?negative Renal ROS  ? ?  ?Musculoskeletal ? ? Abdominal ?(+) + obese,   ?Peds ? Hematology ?  ?Anesthesia Other Findings ? ? Reproductive/Obstetrics ?(+) Pregnancy ? ?  ? ? ? ? ? ? ? ? ? ? ? ? ? ?  ?  ? ? ? ? ? ? ? ? ?Anesthesia Physical ?Anesthesia Plan ? ?ASA: 2 ? ?Anesthesia Plan: Epidural  ? ?Post-op Pain Management:   ? ?Induction:  ? ?PONV Risk Score and Plan:  ? ?Airway Management Planned:  ? ?Additional Equipment:  ? ?Intra-op Plan:  ? ?Post-operative Plan:  ? ?Informed Consent: I have reviewed the patients History and Physical, chart, labs and discussed the procedure including the risks, benefits and alternatives for the proposed anesthesia with the patient or authorized representative who has indicated his/her understanding and acceptance.  ? ? ? ? ? ?Plan Discussed with:  ? ?Anesthesia Plan Comments:   ? ? ? ? ? ? ?Anesthesia Quick Evaluation ? ?

## 2021-07-28 ENCOUNTER — Encounter (HOSPITAL_COMMUNITY): Payer: Self-pay | Admitting: Family Medicine

## 2021-07-28 LAB — COMPREHENSIVE METABOLIC PANEL
ALT: 12 U/L (ref 0–44)
AST: 26 U/L (ref 15–41)
Albumin: 3 g/dL — ABNORMAL LOW (ref 3.5–5.0)
Alkaline Phosphatase: 102 U/L (ref 38–126)
Anion gap: 10 (ref 5–15)
BUN: 12 mg/dL (ref 6–20)
CO2: 17 mmol/L — ABNORMAL LOW (ref 22–32)
Calcium: 8.1 mg/dL — ABNORMAL LOW (ref 8.9–10.3)
Chloride: 106 mmol/L (ref 98–111)
Creatinine, Ser: 1.13 mg/dL — ABNORMAL HIGH (ref 0.44–1.00)
GFR, Estimated: >60 mL/min (ref >60)
Glucose, Bld: 96 mg/dL (ref 70–99)
Potassium: 4.4 mmol/L (ref 3.5–5.1)
Sodium: 133 mmol/L — ABNORMAL LOW (ref 135–145)
Total Bilirubin: 0.7 mg/dL (ref 0.3–1.2)
Total Protein: 6.4 g/dL — ABNORMAL LOW (ref 6.5–8.1)

## 2021-07-28 LAB — CBC
HCT: 34.4 % — ABNORMAL LOW (ref 36.0–46.0)
Hemoglobin: 11.9 g/dL — ABNORMAL LOW (ref 12.0–15.0)
MCH: 31.6 pg (ref 26.0–34.0)
MCHC: 34.6 g/dL (ref 30.0–36.0)
MCV: 91.2 fL (ref 80.0–100.0)
Platelets: 191 10*3/uL (ref 150–400)
RBC: 3.77 MIL/uL — ABNORMAL LOW (ref 3.87–5.11)
RDW: 13.9 % (ref 11.5–15.5)
WBC: 11.9 10*3/uL — ABNORMAL HIGH (ref 4.0–10.5)
nRBC: 0 % (ref 0.0–0.2)

## 2021-07-28 LAB — BASIC METABOLIC PANEL
Anion gap: 9 (ref 5–15)
Anion gap: 5 (ref 5–15)
BUN: 12 mg/dL (ref 6–20)
BUN: 9 mg/dL (ref 6–20)
CO2: 19 mmol/L — ABNORMAL LOW (ref 22–32)
CO2: 22 mmol/L (ref 22–32)
Calcium: 7.8 mg/dL — ABNORMAL LOW (ref 8.9–10.3)
Calcium: 7.4 mg/dL — ABNORMAL LOW (ref 8.9–10.3)
Chloride: 107 mmol/L (ref 98–111)
Chloride: 107 mmol/L (ref 98–111)
Creatinine, Ser: 1.2 mg/dL — ABNORMAL HIGH (ref 0.44–1.00)
Creatinine, Ser: 1.03 mg/dL — ABNORMAL HIGH (ref 0.44–1.00)
GFR, Estimated: 59 mL/min — ABNORMAL LOW (ref >60)
GFR, Estimated: >60 mL/min (ref >60)
Glucose, Bld: 216 mg/dL — ABNORMAL HIGH (ref 70–99)
Glucose, Bld: 105 mg/dL — ABNORMAL HIGH (ref 70–99)
Potassium: 4.1 mmol/L (ref 3.5–5.1)
Potassium: 4.3 mmol/L (ref 3.5–5.1)
Sodium: 135 mmol/L (ref 135–145)
Sodium: 134 mmol/L — ABNORMAL LOW (ref 135–145)

## 2021-07-28 LAB — MAGNESIUM
Magnesium: 4.9 mg/dL — ABNORMAL HIGH (ref 1.7–2.4)
Magnesium: 5.3 mg/dL — ABNORMAL HIGH (ref 1.7–2.4)
Magnesium: 6.8 mg/dL (ref 1.7–2.4)

## 2021-07-28 MED ORDER — ZOLPIDEM TARTRATE 5 MG PO TABS: 5.0000 mg | ORAL_TABLET | Freq: Every evening | ORAL | Status: DC | PRN

## 2021-07-28 MED ORDER — OXYCODONE HCL 5 MG PO TABS
5.0000 mg | ORAL_TABLET | ORAL | Status: DC | PRN
  Administered 2021-07-28: 5 mg via ORAL
  Filled 2021-07-28: qty 1

## 2021-07-28 MED ORDER — FUROSEMIDE 20 MG PO TABS
20.0000 mg | ORAL_TABLET | Freq: Every day | ORAL | Status: DC
  Administered 2021-07-29: 20 mg via ORAL
  Filled 2021-07-28: qty 1

## 2021-07-28 MED ORDER — ONDANSETRON HCL 4 MG PO TABS
4.0000 mg | ORAL_TABLET | ORAL | Status: DC | PRN
  Administered 2021-07-28: 4 mg via ORAL
  Filled 2021-07-28: qty 1

## 2021-07-28 MED ORDER — SODIUM CHLORIDE 0.9 % IV SOLN: 250.0000 mL | INTRAVENOUS | Status: DC | PRN

## 2021-07-28 MED ORDER — ALBUTEROL SULFATE (2.5 MG/3ML) 0.083% IN NEBU
2.5000 mg | INHALATION_SOLUTION | Freq: Four times a day (QID) | RESPIRATORY_TRACT | Status: DC | PRN
  Filled 2021-07-28 (×2): qty 3

## 2021-07-28 MED ORDER — NIFEDIPINE ER OSMOTIC RELEASE 30 MG PO TB24
30.0000 mg | ORAL_TABLET | Freq: Every day | ORAL | Status: DC
  Administered 2021-07-28: 30 mg via ORAL
  Filled 2021-07-28: qty 1

## 2021-07-28 MED ORDER — SODIUM CHLORIDE 0.9% FLUSH: 3.0000 mL | INTRAVENOUS | Status: DC | PRN

## 2021-07-28 MED ORDER — MAGNESIUM SULFATE 40 GM/1000ML IV SOLN
2.0000 g/h | INTRAVENOUS | Status: AC
  Administered 2021-07-28: 2 g/h via INTRAVENOUS
  Filled 2021-07-28: qty 1000

## 2021-07-28 MED ORDER — IBUPROFEN 800 MG PO TABS
400.0000 mg | ORAL_TABLET | Freq: Four times a day (QID) | ORAL | Status: DC
  Administered 2021-07-28 – 2021-07-29 (×6): 400 mg via ORAL
  Filled 2021-07-28 (×6): qty 1

## 2021-07-28 MED ORDER — DIBUCAINE (PERIANAL) 1 % EX OINT: 1.0000 "application " | TOPICAL_OINTMENT | CUTANEOUS | Status: DC | PRN

## 2021-07-28 MED ORDER — ENOXAPARIN SODIUM 40 MG/0.4ML IJ SOSY
40.0000 mg | PREFILLED_SYRINGE | INTRAMUSCULAR | Status: DC
  Administered 2021-07-29: 40 mg via SUBCUTANEOUS
  Filled 2021-07-28: qty 0.4

## 2021-07-28 MED ORDER — LACTATED RINGERS IV SOLN
INTRAVENOUS | Status: DC
Start: 2021-07-28 — End: 2021-07-29

## 2021-07-28 MED ORDER — OXYCODONE HCL 5 MG PO TABS
10.0000 mg | ORAL_TABLET | ORAL | Status: DC | PRN
  Administered 2021-07-28: 10 mg via ORAL
  Filled 2021-07-28: qty 2

## 2021-07-28 MED ORDER — MEASLES, MUMPS & RUBELLA VAC IJ SOLR: 0.5000 mL | Freq: Once | INTRAMUSCULAR | Status: DC

## 2021-07-28 MED ORDER — TETANUS-DIPHTH-ACELL PERTUSSIS 5-2.5-18.5 LF-MCG/0.5 IM SUSY: 0.5000 mL | PREFILLED_SYRINGE | Freq: Once | INTRAMUSCULAR | Status: DC

## 2021-07-28 MED ORDER — ONDANSETRON HCL 4 MG/2ML IJ SOLN: 4.0000 mg | INTRAMUSCULAR | Status: DC | PRN

## 2021-07-28 MED ORDER — COCONUT OIL OIL: 1.0000 "application " | TOPICAL_OIL | Status: DC | PRN

## 2021-07-28 MED ORDER — WITCH HAZEL-GLYCERIN EX PADS: 1.0000 "application " | MEDICATED_PAD | CUTANEOUS | Status: DC | PRN

## 2021-07-28 MED ORDER — PRENATAL MULTIVITAMIN CH
1.0000 | ORAL_TABLET | Freq: Every day | ORAL | Status: DC
  Administered 2021-07-28 – 2021-07-29 (×2): 1 via ORAL
  Filled 2021-07-28 (×2): qty 1

## 2021-07-28 MED ORDER — SIMETHICONE 80 MG PO CHEW: 80.0000 mg | CHEWABLE_TABLET | ORAL | Status: DC | PRN

## 2021-07-28 MED ORDER — DIPHENHYDRAMINE HCL 25 MG PO CAPS: 25.0000 mg | ORAL_CAPSULE | Freq: Four times a day (QID) | ORAL | Status: DC | PRN

## 2021-07-28 MED ORDER — ACETAMINOPHEN 325 MG PO TABS
650.0000 mg | ORAL_TABLET | ORAL | Status: DC | PRN
  Administered 2021-07-28 (×3): 650 mg via ORAL
  Filled 2021-07-28 (×3): qty 2

## 2021-07-28 MED ORDER — SODIUM CHLORIDE 0.9% FLUSH
3.0000 mL | Freq: Two times a day (BID) | INTRAVENOUS | Status: DC
  Administered 2021-07-29: 3 mL via INTRAVENOUS

## 2021-07-28 MED ORDER — SENNOSIDES-DOCUSATE SODIUM 8.6-50 MG PO TABS
2.0000 | ORAL_TABLET | ORAL | Status: DC
  Administered 2021-07-28 – 2021-07-29 (×2): 2 via ORAL
  Filled 2021-07-28 (×2): qty 2

## 2021-07-28 MED ORDER — BENZOCAINE-MENTHOL 20-0.5 % EX AERO: 1.0000 "application " | INHALATION_SPRAY | CUTANEOUS | Status: DC | PRN

## 2021-07-28 NOTE — Plan of Care (Signed)
?  Problem: Health Behavior/Discharge Planning: ?Goal: Ability to manage health-related needs will improve ?Outcome: Progressing ?  ?Problem: Clinical Measurements: ?Goal: Ability to maintain clinical measurements within normal limits will improve ?Outcome: Progressing ?Goal: Will remain free from infection ?Outcome: Progressing ?Goal: Diagnostic test results will improve ?Outcome: Progressing ?Goal: Respiratory complications will improve ?Outcome: Progressing ?Goal: Cardiovascular complication will be avoided ?Outcome: Progressing ?  ?Problem: Activity: ?Goal: Risk for activity intolerance will decrease ?Outcome: Progressing ?  ?Problem: Nutrition: ?Goal: Adequate nutrition will be maintained ?Outcome: Progressing ?  ?Problem: Coping: ?Goal: Level of anxiety will decrease ?Outcome: Progressing ?  ?Problem: Elimination: ?Goal: Will not experience complications related to bowel motility ?Outcome: Progressing ?Goal: Will not experience complications related to urinary retention ?Outcome: Progressing ?  ?Problem: Pain Managment: ?Goal: General experience of comfort will improve ?Outcome: Progressing ?  ?Problem: Safety: ?Goal: Ability to remain free from injury will improve ?Outcome: Progressing ?  ?Problem: Skin Integrity: ?Goal: Risk for impaired skin integrity will decrease ?Outcome: Progressing ?  ?Problem: Education: ?Goal: Knowledge of disease or condition will improve ?Outcome: Progressing ?Goal: Knowledge of the prescribed therapeutic regimen will improve ?Outcome: Progressing ?  ?Problem: Fluid Volume: ?Goal: Peripheral tissue perfusion will improve ?Outcome: Progressing ?  ?Problem: Clinical Measurements: ?Goal: Complications related to disease process, condition or treatment will be avoided or minimized ?Outcome: Progressing ?  ?

## 2021-07-28 NOTE — Progress Notes (Signed)
Post Partum Day 1 ?Subjective: ?no complaints, up ad lib, voiding, and tolerating PO, had a HA but improving ? ?Objective: ?Blood pressure 128/78, pulse 68, temperature (!) 97.4 ?F (36.3 ?C), temperature source Oral, resp. rate 16, height 5' 0.5" (1.537 m), weight 77.6 kg, last menstrual period 11/15/2020, SpO2 100 %, unknown if currently breastfeeding. ? ?Physical Exam:  ?General: alert, cooperative, and no distress ?Lochia: appropriate ?Uterine Fundus: firm ?Incision: n/a ?DVT Evaluation: No significant calf/ankle edema. ? ?Recent Labs  ?  07/27/21 ?1830 07/28/21 ?2505  ?HGB 11.7* 11.9*  ?HCT 34.4* 34.4*  ? ? ?Assessment/Plan: ?Postpartum ?- Contraception: vasectomy ?- MOF: breast ?- Rh status: pos ?- Rubella status: immune ?- Dispo: Anticipate d/c tomorrow ?- Consults: None ? ?Preeclampsia with Severe features (Cr/HA) ?- HA improving ?- Creatinine 1.2, Mag is currently 2g/hr, and last Mag level was appropriate. Will recheck labs at 1500 and then we will trend her creatinine tomorrow morning especially in light of h/o nephrectomy. Her UOP is excellent.  ?- Continue Magnesium for 24 hours ?- No indication yet for blood pressure medication ?- Reviewed possible Lasix tomorrow or for home but will first watch creatinine.  ? ?Neonatal ?- Doing well and in room with mother.  ?- Circumcision: Parent desires circumcision for this female infant.  Circumcision procedure details discussed, risks and benefits of procedure were also discussed.  The benefits include but are not limited to: reduction in the rates of urinary tract infection (UTI), penile cancer, sexually transmitted infections including HIV, penile inflammatory and retractile disorders.  Circumcision also helps obtain better and easier hygiene of the penis.  Risks include but are not limited to: bleeding, infection, injury of glans which may lead to penile deformity or urinary tract issues or Urology intervention, unsatisfactory cosmetic appearance and other  potential complications related to the procedure.  It was emphasized that this is an elective procedure.   ? ? ? ? LOS: 1 day  ? ?Radene Gunning ?07/28/2021, 11:20 AM   ?

## 2021-07-28 NOTE — Progress Notes (Signed)
Called to bedside - pt up on toilet reporting SOB.  Pt able to be moved back in bed.  She reports that with any type of exertion she notes difficulty breathing that then resolves at rest.  Also notes feeling fatigued and tired.  Denies headache or blurry vision.  Upon further questioning she reports h/o asthma in pregnancy and has been using an inhaler, which she has not been using here. ? ?O: BP (!) 151/93   Pulse 60   Temp 98.3 ?F (36.8 ?C) (Oral)   Resp 19   Ht 5' 0.5" (1.537 m)   Wt 77.6 kg   LMP 11/15/2020 (Approximate) Comment: pt (-) preg test  11/09/20  SpO2 97%   Breastfeeding Unknown   BMI 32.87 kg/m?  ? ?Gen: no acute distress, talking without evidence of SOB/difficulty breath ?CV: RRR ?Lungs: CTAB ?Abd: soft and non-tender ?Ext: minimal edema, no calf tenderness bilaterally ? ?Results for orders placed or performed during the hospital encounter of 07/27/21 (from the past 24 hour(s))  ?CBC     Status: Abnormal  ? Collection Time: 07/27/21  6:30 PM  ?Result Value Ref Range  ? WBC 9.2 4.0 - 10.5 K/uL  ? RBC 3.78 (L) 3.87 - 5.11 MIL/uL  ? Hemoglobin 11.7 (L) 12.0 - 15.0 g/dL  ? HCT 34.4 (L) 36.0 - 46.0 %  ? MCV 91.0 80.0 - 100.0 fL  ? MCH 31.0 26.0 - 34.0 pg  ? MCHC 34.0 30.0 - 36.0 g/dL  ? RDW 13.8 11.5 - 15.5 %  ? Platelets 205 150 - 400 K/uL  ? nRBC 0.0 0.0 - 0.2 %  ?Group B strep by PCR     Status: None  ? Collection Time: 07/27/21  9:48 PM  ? Specimen: Vaginal/Rectal; Genital  ?Result Value Ref Range  ? Group B strep by PCR NEGATIVE NEGATIVE  ?Comprehensive metabolic panel     Status: Abnormal  ? Collection Time: 07/27/21 11:33 PM  ?Result Value Ref Range  ? Sodium 133 (L) 135 - 145 mmol/L  ? Potassium 4.4 3.5 - 5.1 mmol/L  ? Chloride 106 98 - 111 mmol/L  ? CO2 17 (L) 22 - 32 mmol/L  ? Glucose, Bld 96 70 - 99 mg/dL  ? BUN 12 6 - 20 mg/dL  ? Creatinine, Ser 1.13 (H) 0.44 - 1.00 mg/dL  ? Calcium 8.1 (L) 8.9 - 10.3 mg/dL  ? Total Protein 6.4 (L) 6.5 - 8.1 g/dL  ? Albumin 3.0 (L) 3.5 - 5.0 g/dL  ?  AST 26 15 - 41 U/L  ? ALT 12 0 - 44 U/L  ? Alkaline Phosphatase 102 38 - 126 U/L  ? Total Bilirubin 0.7 0.3 - 1.2 mg/dL  ? GFR, Estimated >60 >60 mL/min  ? Anion gap 10 5 - 15  ?Magnesium     Status: Abnormal  ? Collection Time: 07/27/21 11:33 PM  ?Result Value Ref Range  ? Magnesium 4.9 (H) 1.7 - 2.4 mg/dL  ?CBC     Status: Abnormal  ? Collection Time: 07/28/21  3:31 AM  ?Result Value Ref Range  ? WBC 11.9 (H) 4.0 - 10.5 K/uL  ? RBC 3.77 (L) 3.87 - 5.11 MIL/uL  ? Hemoglobin 11.9 (L) 12.0 - 15.0 g/dL  ? HCT 34.4 (L) 36.0 - 46.0 %  ? MCV 91.2 80.0 - 100.0 fL  ? MCH 31.6 26.0 - 34.0 pg  ? MCHC 34.6 30.0 - 36.0 g/dL  ? RDW 13.9 11.5 - 15.5 %  ? Platelets  191 150 - 400 K/uL  ? nRBC 0.0 0.0 - 0.2 %  ?Magnesium     Status: Abnormal  ? Collection Time: 07/28/21  3:31 AM  ?Result Value Ref Range  ? Magnesium 5.3 (H) 1.7 - 2.4 mg/dL  ?Basic metabolic panel     Status: Abnormal  ? Collection Time: 07/28/21  3:31 AM  ?Result Value Ref Range  ? Sodium 135 135 - 145 mmol/L  ? Potassium 4.1 3.5 - 5.1 mmol/L  ? Chloride 107 98 - 111 mmol/L  ? CO2 19 (L) 22 - 32 mmol/L  ? Glucose, Bld 216 (H) 70 - 99 mg/dL  ? BUN 12 6 - 20 mg/dL  ? Creatinine, Ser 1.20 (H) 0.44 - 1.00 mg/dL  ? Calcium 7.8 (L) 8.9 - 10.3 mg/dL  ? GFR, Estimated 59 (L) >60 mL/min  ? Anion gap 9 5 - 15  ?Magnesium     Status: Abnormal  ? Collection Time: 07/28/21  2:52 PM  ?Result Value Ref Range  ? Magnesium 6.8 (HH) 1.7 - 2.4 mg/dL  ?Basic metabolic panel     Status: Abnormal  ? Collection Time: 07/28/21  2:52 PM  ?Result Value Ref Range  ? Sodium 134 (L) 135 - 145 mmol/L  ? Potassium 4.3 3.5 - 5.1 mmol/L  ? Chloride 107 98 - 111 mmol/L  ? CO2 22 22 - 32 mmol/L  ? Glucose, Bld 105 (H) 70 - 99 mg/dL  ? BUN 9 6 - 20 mg/dL  ? Creatinine, Ser 1.03 (H) 0.44 - 1.00 mg/dL  ? Calcium 7.4 (L) 8.9 - 10.3 mg/dL  ? GFR, Estimated >60 >60 mL/min  ? Anion gap 5 5 - 15  ? ? ? ?A/P: 41yo [id number] s/p NSVD complicated by: ?-SOB ?Suspect this may be due to Magnesium, which has now  been discontinued ?VSS, O2 sat normal ?Albuterol neb treatment ordered due to h/o asthma ?If no improvement will consider imaging studies for further evaluation ?Prophylactic Lovenox ordered ? ?-Preeclampsia with severe features ? Discontinued Mag ? Started on Procardia XL '30mg'$  daily ? Continue Lasix '20mg'$  daily ? Will continue to closely monitor BPs ?-continue routine postpartum care ? ?Janyth Pupa, DO ?Attending [place] ?[facility] ? ? ?

## 2021-07-28 NOTE — Lactation Note (Signed)
This note was copied from a baby's chart. ?Lactation Consultation Note ? ?Patient Name: [name] [name] ?Today's Date: 07/28/2021 ?Age:41 hours - P 3  ?Mom asleep ( on MagSo4 ) and per grandmother has a migrane. Baby is recently fed at 6:50am both sides and had 2 wets so far. Per grandmother- " its her 3rd baby and she has BF before and will call if she needs LC."  [facility] RN aware. ? ?Maternal Data ?  ? ?Feeding ?Mother's Current Feeding Choice: Breast Milk ? ?LATCH Score ?  ? ?  ? ?  ? ?  ? ?  ? ?  ? ? ?Lactation Tools Discussed/Used ?  ? ?Interventions ?  ? ?Discharge ?  ? ?Consult Status ?Consult Status: PRN ?Date: 07/28/21 ?Follow-up type: In-patient ? ? ? ?Jerlyn Ly Severus Brodzinski ?07/28/2021, 8:06 AM ? ? ? ?

## 2021-07-28 NOTE — Anesthesia Postprocedure Evaluation (Signed)
Anesthesia Post Note ? ?Patient: [name] [name] ? ?Procedure(s) Performed: AN AD HOC LABOR EPIDURAL ? ?  ? ?Patient location during evaluation: Mother Baby ?Anesthesia Type: Epidural ?Level of consciousness: awake ?Pain management: satisfactory to patient ?Vital Signs Assessment: post-procedure vital signs reviewed and stable ?Respiratory status: spontaneous breathing ?Cardiovascular status: stable ?Anesthetic complications: no ? ? ?No notable events documented. ? ?Last Vitals:  ?Vitals:  ? 07/28/21 0900 07/28/21 1000  ?BP:    ?Pulse:    ?Resp: 17 16  ?Temp:    ?SpO2:    ?  ?Last Pain:  ?Vitals:  ? 07/28/21 0845  ?TempSrc:   ?PainSc: 7   ? ?Pain Goal:   ? ?  ?  ?  ?  ?  ?  ?  ? ?Rainier Feuerborn ? ? ? ? ?

## 2021-07-28 NOTE — Progress Notes (Signed)
Patient states feeling better after breathing treatment and discontinuation of Mag.  ?

## 2021-07-28 NOTE — Progress Notes (Signed)
1623 patient complained of difficulty breathing and feeling weak. Magnesium stopped and MD notified (Ozan). Plan of care to keep the Wall turned off for now and treat symptoms.  ?

## 2021-07-29 LAB — BASIC METABOLIC PANEL
Anion gap: 7 (ref 5–15)
BUN: 10 mg/dL (ref 6–20)
CO2: 21 mmol/L — ABNORMAL LOW (ref 22–32)
Calcium: 7.5 mg/dL — ABNORMAL LOW (ref 8.9–10.3)
Chloride: 107 mmol/L (ref 98–111)
Creatinine, Ser: 1.12 mg/dL — ABNORMAL HIGH (ref 0.44–1.00)
GFR, Estimated: >60 mL/min (ref >60)
Glucose, Bld: 125 mg/dL — ABNORMAL HIGH (ref 70–99)
Potassium: 4 mmol/L (ref 3.5–5.1)
Sodium: 135 mmol/L (ref 135–145)

## 2021-07-29 MED ORDER — SERTRALINE HCL 50 MG PO TABS
25.0000 mg | ORAL_TABLET | Freq: Every day | ORAL | Status: DC
  Administered 2021-07-29: 25 mg via ORAL
  Filled 2021-07-29: qty 1

## 2021-07-29 MED ORDER — OXYCODONE HCL 5 MG PO TABS
5.0000 mg | ORAL_TABLET | ORAL | 0 refills | Status: AC | PRN
Start: 2021-07-29 — End: ?

## 2021-07-29 MED ORDER — FUROSEMIDE 20 MG PO TABS: 20.0000 mg | ORAL_TABLET | Freq: Every day | ORAL | 0 refills | Status: DC

## 2021-07-29 MED ORDER — NIFEDIPINE ER 60 MG PO TB24: 60.0000 mg | ORAL_TABLET | Freq: Every day | ORAL | 1 refills | Status: DC

## 2021-07-29 MED ORDER — NIFEDIPINE ER OSMOTIC RELEASE 60 MG PO TB24
60.0000 mg | ORAL_TABLET | Freq: Every day | ORAL | Status: DC
Start: 2021-07-29 — End: 2021-07-29
  Administered 2021-07-29: 60 mg via ORAL
  Filled 2021-07-29: qty 1

## 2021-07-29 MED ORDER — ACETAMINOPHEN 325 MG PO TABS: 650.0000 mg | ORAL_TABLET | ORAL | 0 refills | Status: AC | PRN

## 2021-07-29 NOTE — Plan of Care (Signed)
?  Problem: Health Behavior/Discharge Planning: ?Goal: Ability to manage health-related needs will improve ?Outcome: Completed/Met ?  ?Problem: Clinical Measurements: ?Goal: Ability to maintain clinical measurements within normal limits will improve ?Outcome: Completed/Met ?Goal: Will remain free from infection ?Outcome: Completed/Met ?Goal: Diagnostic test results will improve ?Outcome: Completed/Met ?Goal: Respiratory complications will improve ?Outcome: Completed/Met ?Goal: Cardiovascular complication will be avoided ?Outcome: Completed/Met ?  ?Problem: Activity: ?Goal: Risk for activity intolerance will decrease ?Outcome: Completed/Met ?  ?Problem: Nutrition: ?Goal: Adequate nutrition will be maintained ?Outcome: Completed/Met ?  ?Problem: Coping: ?Goal: Level of anxiety will decrease ?Outcome: Completed/Met ?  ?Problem: Elimination: ?Goal: Will not experience complications related to bowel motility ?Outcome: Completed/Met ?Goal: Will not experience complications related to urinary retention ?Outcome: Completed/Met ?  ?Problem: Pain Managment: ?Goal: General experience of comfort will improve ?Outcome: Completed/Met ?  ?Problem: Safety: ?Goal: Ability to remain free from injury will improve ?Outcome: Completed/Met ?  ?Problem: Skin Integrity: ?Goal: Risk for impaired skin integrity will decrease ?Outcome: Completed/Met ?  ?Problem: Education: ?Goal: Knowledge of disease or condition will improve ?Outcome: Completed/Met ?Goal: Knowledge of the prescribed therapeutic regimen will improve ?Outcome: Completed/Met ?  ?Problem: Fluid Volume: ?Goal: Peripheral tissue perfusion will improve ?Outcome: Completed/Met ?  ?Problem: Clinical Measurements: ?Goal: Complications related to disease process, condition or treatment will be avoided or minimized ?Outcome: Completed/Met ?  ?

## 2021-07-29 NOTE — Lactation Note (Signed)
This note was copied from a baby's chart. ?Lactation Consultation Note ? ?Patient Name: Boy [place] Carreker ?Today's Date: 07/29/2021 ?  ?Age:41 hours ? ?Mom declined [place] services. [place] talked with RN, Rodena Medin, to provided LPTI supplementation guidelines for Mom.  ?Mom getting DBM, decided against pumping as per RN, she felt led to mastitis in the past.  ? ? ?Maternal Data ?  ? ?Feeding ?Nipple Type: Nfant Standard Flow (white) ? ?LATCH Score ?  ? ?  ? ?  ? ?  ? ?  ? ?  ? ? ?Lactation Tools Discussed/Used ?  ? ?Interventions ?  ? ?Discharge ?  ? ?Consult Status ?  ? ? ? ?Macrina Lehnert  Nicholson-Springer ?07/29/2021, 11:16 AM ? ? ? ?

## 2021-07-29 NOTE — Progress Notes (Signed)
[name] [name] was referred for history of depression/anxiety. ?* Referral screened out by Clinical Social Worker because none of the following criteria appear to apply: ?~ History of anxiety/depression during this pregnancy, or of post-partum depression following prior delivery. ?~ Diagnosis of anxiety and/or depression within last 3 years ?OR ?* [name] [name]'s symptoms currently being treated with medication and/or therapy. [name] [name] has an active Rx for Zoloft and [name] [name] has established care with behavioral health specialist at [facility]. ? ?Please contact the Clinical Social Worker if needs arise, by [facility] request, or if [name] [name] scores greater than 9/yes to question 10 on Edinburgh Postpartum Depression Screen.  ? ?Laurey Arrow, MSW, LCSW ?Clinical Social Work ?([phone] ? ?

## 2021-08-01 ENCOUNTER — Encounter: Payer: Self-pay | Admitting: *Deleted

## 2021-08-01 ENCOUNTER — Encounter: Payer: Self-pay | Admitting: Family Medicine

## 2021-08-03 ENCOUNTER — Ambulatory Visit (INDEPENDENT_AMBULATORY_CARE_PROVIDER_SITE_OTHER): Payer: 59 | Admitting: *Deleted

## 2021-08-03 VITALS — BP 131/83 | HR 67 | Ht 60.5 in

## 2021-08-03 DIAGNOSIS — O1414 Severe pre-eclampsia complicating childbirth: Secondary | ICD-10-CM

## 2021-08-03 MED ORDER — NIFEDIPINE ER 60 MG PO TB24: 60.0000 mg | ORAL_TABLET | Freq: Two times a day (BID) | ORAL | 1 refills | Status: DC

## 2021-08-03 NOTE — Progress Notes (Signed)
Pt presents for BP check - 131/83.  Unable to weigh pt today as she had baby in a body sling and infant was asleep. She reports having some elevated blood pressures and has been sending the results to the [facility] app. She has occasional H/A and some blurry vision approximately 12-13 hours after she takes daily Nifedipine. This is also the time when pt has had elevated BP. Pt stated that Dr. Kennon Rounds had sent a Mychart message which she just saw today wherein she was advised that she could begin taking Nifedipine twice daily due to the elevated BP's. I confirmed the message from Dr. Kennon Rounds per chart review. New Rx sent to pharmacy for Nifedipine 60 mg BID.  Pt agrees with plan of care. She will continue to check BP's @ home and will keep PP appt as scheduled on 6/21.  She may contact the office for any concerns.

## 2021-08-06 ENCOUNTER — Other Ambulatory Visit: Payer: Self-pay | Admitting: Certified Nurse Midwife

## 2021-08-06 DIAGNOSIS — N3000 Acute cystitis without hematuria: Secondary | ICD-10-CM

## 2021-08-06 DIAGNOSIS — B3731 Acute candidiasis of vulva and vagina: Secondary | ICD-10-CM

## 2021-08-06 MED ORDER — CEFADROXIL 500 MG PO CAPS: 500.0000 mg | ORAL_CAPSULE | Freq: Two times a day (BID) | ORAL | 0 refills | Status: DC

## 2021-08-06 MED ORDER — FLUCONAZOLE 150 MG PO TABS: 150.0000 mg | ORAL_TABLET | Freq: Every day | ORAL | 1 refills | Status: DC

## 2021-08-06 NOTE — Progress Notes (Signed)
Pt has been feeling rundown for two days with occasional low grade fever, has been resting and hydrating. Today woke up with dysuria. Duricef '500mg'$  BID x7 days ordered with prophylactic diflucan to use when antibiotics finished if she develops s/sx of yeast (vaginal or breast). Pt will follow up in office if symptoms persist.  Gaylan Gerold, CNM, MSN, [facility] Certified Nurse Midwife, [place]

## 2021-08-10 ENCOUNTER — Encounter: Payer: Self-pay | Admitting: Family Medicine

## 2021-08-11 NOTE — Telephone Encounter (Signed)
Opened in error

## 2021-09-05 ENCOUNTER — Other Ambulatory Visit: Payer: Self-pay

## 2021-09-05 ENCOUNTER — Ambulatory Visit (INDEPENDENT_AMBULATORY_CARE_PROVIDER_SITE_OTHER): Payer: 59 | Admitting: Certified Nurse Midwife

## 2021-09-05 ENCOUNTER — Encounter: Payer: Self-pay | Admitting: Certified Nurse Midwife

## 2021-09-05 DIAGNOSIS — Z905 Acquired absence of kidney: Secondary | ICD-10-CM | POA: Diagnosis not present

## 2021-09-05 DIAGNOSIS — Z3202 Encounter for pregnancy test, result negative: Secondary | ICD-10-CM

## 2021-09-05 DIAGNOSIS — O1414 Severe pre-eclampsia complicating childbirth: Secondary | ICD-10-CM | POA: Diagnosis not present

## 2021-09-05 DIAGNOSIS — N393 Stress incontinence (female) (male): Secondary | ICD-10-CM

## 2021-09-05 DIAGNOSIS — Z30430 Encounter for insertion of intrauterine contraceptive device: Secondary | ICD-10-CM | POA: Diagnosis not present

## 2021-09-05 DIAGNOSIS — F419 Anxiety disorder, unspecified: Secondary | ICD-10-CM

## 2021-09-05 LAB — POCT PREGNANCY, URINE: Preg Test, Ur: NEGATIVE

## 2021-09-05 MED ORDER — NIFEDIPINE ER 60 MG PO TB24: 60.0000 mg | ORAL_TABLET | Freq: Every day | ORAL | 1 refills | Status: AC

## 2021-09-05 MED ORDER — LEVONORGESTREL 20.1 MCG/DAY IU IUD
1.0000 | INTRAUTERINE_SYSTEM | Freq: Once | INTRAUTERINE | Status: AC
  Administered 2021-09-05: 1 via INTRAUTERINE

## 2021-09-05 NOTE — Progress Notes (Signed)
IUD Insertion Procedure Note Patient identified, informed consent performed, consent signed.   Discussed risks of irregular bleeding, cramping, infection, malpositioning or misplacement of the IUD outside the uterus which may require further procedure such as laparoscopy. Also discussed >99% contraception efficacy, increased risk of ectopic pregnancy with failure of method.   Emphasized that this did not protect against STIs, condoms recommended during all sexual encounters. Time out was performed.  Urine pregnancy test negative.  Speculum placed in the vagina.  Cervix visualized.  Cleaned with Betadine x 2.  Grasped anteriorly with a single tooth tenaculum.  Uterus sounded to 8 cm.  Liletta IUD placed per manufacturer's recommendations.  Strings trimmed to 3 cm. Tenaculum was removed, good hemostasis noted.  Patient tolerated procedure well.   Patient was given post-procedure instructions.  She was advised to have backup contraception for one week.  Patient was also asked to check IUD strings periodically and follow up in 4 weeks for IUD check.  Gaylan Gerold, CNM, MSN, [place] Certified Nurse Midwife, [facility]

## 2021-09-05 NOTE — Progress Notes (Signed)
Postpartum Visit Note  [name] [name] is a 41 y.o. [phone] female who presents for a postpartum visit. She is 5 days postpartum following a normal spontaneous vaginal delivery.  I have fully reviewed the prenatal and intrapartum course. The delivery was at 17 gestational weeks.  Anesthesia: epidural. Postpartum course has been uncomplicated. Baby is doing well. Baby is feeding by breast. Bleeding no bleeding. Bowel function is normal. Bladder function is abnormal: stress incontinence . Patient is not sexually active. Contraception method is IUD. Postpartum depression screening: negative.   Upstream - 09/05/21 1841       Pregnancy Intention Screening   Does the patient want to become pregnant in the next year? No    Does the patient's partner want to become pregnant in the next year? No    Would the patient like to discuss contraceptive options today? No      Contraception Wrap Up   Current Method Abstinence    End Method IUD or IUS    Contraception Counseling Provided Yes            The pregnancy intention screening data noted above was reviewed. Potential methods of contraception were discussed. The patient elected to proceed with IUD or IUS.   Edinburgh Postnatal Depression Scale - 09/05/21 1347       Edinburgh Postnatal Depression Scale:  In the Past 7 Days   I have been able to laugh and see the funny side of things. 0    I have looked forward with enjoyment to things. 0    I have blamed myself unnecessarily when things went wrong. 0    I have been anxious or worried for no good reason. 2    I have felt scared or panicky for no good reason. 2    Things have been getting on top of me. 0    I have been so unhappy that I have had difficulty sleeping. 0    I have felt sad or miserable. 0    I have been so unhappy that I have been crying. 0    The thought of harming myself has occurred to me. 0    Edinburgh Postnatal Depression Scale Total 4            Health  Maintenance Due  Topic Date Due   COVID-19 Vaccine (3 - Pfizer risk series) 06/03/2020   The following portions of the patient's history were reviewed and updated as appropriate: allergies, current medications, past family history, past medical history, past social history, past surgical history, and problem list.  Review of Systems Pertinent items noted in HPI and remainder of comprehensive ROS otherwise negative.  Objective:  BP (!) 159/94   Wt 167 lb (75.8 kg)   LMP 11/15/2020 (Approximate) Comment: pt (-) preg test  11/09/20  BMI 32.08 kg/m    Constitutional: Alert, oriented female in no physical distress.  HEENT: PERRLA Skin: normal color and turgor, no rash Cardiovascular: normal rate & rhythm Respiratory: normal effort, no problems with respiration noted GI: Abd soft, non-tender MS: Extremities nontender, no edema, normal ROM Neurologic: Alert and oriented x 4.  GU: no CVA tenderness Pelvic: perineum healing well, see IUD insertion note Breasts: normal lactating breasts, no edema or signs of nipple damage Incision: well-approximated without exudate, redness or edema.      Assessment:  1. Postpartum care and examination - Doing well overall - Suggested pelvic floor physical therapy for stress incontinence  2. History of right radical nephrectomy -  CMP ordered  3. Hypertension in pregnancy, preeclampsia, severe, delivered - hypertensive today, stopped taking procardia. Stated her BP remained high for 16wks postpartum after her last baby. Will refill procardia and recheck at next visit (for IUD string check)  4. Anxiety - Stable on current dose of zoloft. Transitioning to motherhood of 3 well.  Plan:   Essential components of care per ACOG recommendations:  1.  Mood and well being: Patient with negative depression screening today. Reviewed local resources for support.  - Patient tobacco use? No.   - hx of drug use? No.    2. Infant care and feeding:  -Patient  currently breastmilk feeding? Yes. Reviewed importance of draining breast regularly to support lactation.  -Social determinants of health (SDOH) reviewed in EPIC. No concerns  3. Sexuality, contraception and birth spacing - Patient does not want a pregnancy in the next year.  Desired family size is 3 children.  - Reviewed reproductive life planning. Reviewed contraceptive methods based on pt preferences and effectiveness.  Patient desired IUD or IUS today.   - Discussed birth spacing of 18 months  4. Sleep and fatigue -Encouraged family/partner/community support of 4 hrs of uninterrupted sleep to help with mood and fatigue  5. Physical Recovery  - Discussed patients delivery and complications. She describes her labor as mixed. - Patient had a Vaginal, no problems at delivery. Patient had  no  laceration. Perineal healing reviewed. Patient expressed understanding - Patient has urinary incontinence? Yes. Discussed role of pelvic floor PT. Offered PT and patient accepted. Patient was referred to pelvic floor PT.  - Patient is safe to resume physical and sexual activity with back up method for one week  6.  Health Maintenance - HM due items addressed Yes - Last pap smear  Diagnosis  Date Value Ref Range Status  11/18/2018   Final   NEGATIVE FOR INTRAEPITHELIAL LESIONS OR MALIGNANCY.   Pap smear not done at today's visit.  -Breast Cancer screening indicated? No.   7. Chronic Disease/Pregnancy Condition follow up: Hypertension - PCP follow up if needed  Gabriel Carina, [place] [facility] [place]

## 2021-09-06 ENCOUNTER — Encounter: Payer: Self-pay | Admitting: Certified Nurse Midwife

## 2021-09-06 LAB — COMPREHENSIVE METABOLIC PANEL
ALT: 24 IU/L (ref 0–32)
AST: 26 IU/L (ref 0–40)
Albumin/Globulin Ratio: 2 (ref 1.2–2.2)
Albumin: 4.5 g/dL (ref 3.8–4.8)
Alkaline Phosphatase: 74 IU/L (ref 44–121)
BUN/Creatinine Ratio: 8 — ABNORMAL LOW (ref 9–23)
BUN: 9 mg/dL (ref 6–24)
Bilirubin Total: 0.3 mg/dL (ref 0.0–1.2)
CO2: 24 mmol/L (ref 20–29)
Calcium: 9.4 mg/dL (ref 8.7–10.2)
Chloride: 103 mmol/L (ref 96–106)
Creatinine, Ser: 1.08 mg/dL — ABNORMAL HIGH (ref 0.57–1.00)
Globulin, Total: 2.2 g/dL (ref 1.5–4.5)
Glucose: 77 mg/dL (ref 70–99)
Potassium: 4.5 mmol/L (ref 3.5–5.2)
Sodium: 142 mmol/L (ref 134–144)
Total Protein: 6.7 g/dL (ref 6.0–8.5)
eGFR: 66 mL/min/{1.73_m2} (ref >59)

## 2021-09-26 ENCOUNTER — Emergency Department (HOSPITAL_BASED_OUTPATIENT_CLINIC_OR_DEPARTMENT_OTHER)
Admission: EM | Admit: 2021-09-26 | Discharge: 2021-09-26 | Disposition: A | Discharge status: Home / Self Care | Payer: 59 | Attending: Emergency Medicine | Admitting: Emergency Medicine

## 2021-09-26 ENCOUNTER — Emergency Department (HOSPITAL_BASED_OUTPATIENT_CLINIC_OR_DEPARTMENT_OTHER): Payer: 59

## 2021-09-26 ENCOUNTER — Encounter (HOSPITAL_BASED_OUTPATIENT_CLINIC_OR_DEPARTMENT_OTHER): Payer: Self-pay | Admitting: Emergency Medicine

## 2021-09-26 ENCOUNTER — Other Ambulatory Visit: Payer: Self-pay

## 2021-09-26 DIAGNOSIS — K30 Functional dyspepsia: Secondary | ICD-10-CM | POA: Insufficient documentation

## 2021-09-26 DIAGNOSIS — R079 Chest pain, unspecified: Secondary | ICD-10-CM | POA: Diagnosis present

## 2021-09-26 DIAGNOSIS — G43809 Other migraine, not intractable, without status migrainosus: Secondary | ICD-10-CM | POA: Insufficient documentation

## 2021-09-26 LAB — HEPATIC FUNCTION PANEL
ALT: 41 U/L (ref 0–44)
AST: 35 U/L (ref 15–41)
Albumin: 4.6 g/dL (ref 3.5–5.0)
Alkaline Phosphatase: 64 U/L (ref 38–126)
Bilirubin, Direct: <0.1 mg/dL (ref 0.0–0.2)
Total Bilirubin: 0.6 mg/dL (ref 0.3–1.2)
Total Protein: 8.4 g/dL — ABNORMAL HIGH (ref 6.5–8.1)

## 2021-09-26 LAB — URINALYSIS, ROUTINE W REFLEX MICROSCOPIC
Bilirubin Urine: NEGATIVE
Glucose, UA: NEGATIVE mg/dL
Ketones, ur: NEGATIVE mg/dL
Nitrite: NEGATIVE
Protein, ur: NEGATIVE mg/dL
Specific Gravity, Urine: 1.01 (ref 1.005–1.030)
pH: 6.5 (ref 5.0–8.0)

## 2021-09-26 LAB — CBC
HCT: 39.3 % (ref 36.0–46.0)
Hemoglobin: 13.4 g/dL (ref 12.0–15.0)
MCH: 30.7 pg (ref 26.0–34.0)
MCHC: 34.1 g/dL (ref 30.0–36.0)
MCV: 90.1 fL (ref 80.0–100.0)
Platelets: 309 10*3/uL (ref 150–400)
RBC: 4.36 MIL/uL (ref 3.87–5.11)
RDW: 12.4 % (ref 11.5–15.5)
WBC: 6.5 10*3/uL (ref 4.0–10.5)
nRBC: 0 % (ref 0.0–0.2)

## 2021-09-26 LAB — BASIC METABOLIC PANEL
Anion gap: 11 (ref 5–15)
BUN: 18 mg/dL (ref 6–20)
CO2: 23 mmol/L (ref 22–32)
Calcium: 9.9 mg/dL (ref 8.9–10.3)
Chloride: 102 mmol/L (ref 98–111)
Creatinine, Ser: 1.29 mg/dL — ABNORMAL HIGH (ref 0.44–1.00)
GFR, Estimated: 53 mL/min — ABNORMAL LOW (ref >60)
Glucose, Bld: 81 mg/dL (ref 70–99)
Potassium: 4.1 mmol/L (ref 3.5–5.1)
Sodium: 136 mmol/L (ref 135–145)

## 2021-09-26 LAB — BRAIN NATRIURETIC PEPTIDE: B Natriuretic Peptide: 11.5 pg/mL (ref 0.0–100.0)

## 2021-09-26 LAB — URINALYSIS, MICROSCOPIC (REFLEX)

## 2021-09-26 LAB — TROPONIN I (HIGH SENSITIVITY)
Troponin I (High Sensitivity): 4 ng/L (ref <18)
Troponin I (High Sensitivity): 5 ng/L (ref <18)

## 2021-09-26 LAB — LIPASE, BLOOD: Lipase: 42 U/L (ref 11–51)

## 2021-09-26 LAB — PREGNANCY, URINE: Preg Test, Ur: NEGATIVE

## 2021-09-26 MED ORDER — FAMOTIDINE IN NACL 20-0.9 MG/50ML-% IV SOLN
20.0000 mg | Freq: Once | INTRAVENOUS | Status: AC
  Administered 2021-09-26: 20 mg via INTRAVENOUS
  Filled 2021-09-26: qty 50

## 2021-09-26 MED ORDER — ALUM & MAG HYDROXIDE-SIMETH 200-200-20 MG/5ML PO SUSP
30.0000 mL | Freq: Once | ORAL | Status: AC
  Administered 2021-09-26: 30 mL via ORAL
  Filled 2021-09-26: qty 30

## 2021-09-26 MED ORDER — SUCRALFATE 1 G PO TABS: 1.0000 g | ORAL_TABLET | Freq: Three times a day (TID) | ORAL | 0 refills | Status: AC

## 2021-09-26 MED ORDER — LIDOCAINE VISCOUS HCL 2 % MT SOLN
15.0000 mL | Freq: Once | OROMUCOSAL | Status: AC
  Administered 2021-09-26: 15 mL via ORAL
  Filled 2021-09-26: qty 15

## 2021-09-26 MED ORDER — SODIUM CHLORIDE 0.9 % IV BOLUS
1000.0000 mL | Freq: Once | INTRAVENOUS | Status: AC
Start: 2021-09-26 — End: 2021-09-26
  Administered 2021-09-26: 1000 mL via INTRAVENOUS

## 2021-09-26 MED ORDER — METOCLOPRAMIDE HCL 5 MG/ML IJ SOLN
5.0000 mg | Freq: Once | INTRAMUSCULAR | Status: AC
  Administered 2021-09-26: 5 mg via INTRAVENOUS
  Filled 2021-09-26: qty 2

## 2021-09-26 MED ORDER — LIDOCAINE 5 % EX PTCH
1.0000 | MEDICATED_PATCH | CUTANEOUS | Status: DC
  Administered 2021-09-26: 1 via TRANSDERMAL
  Filled 2021-09-26: qty 1

## 2021-09-26 MED ORDER — MAGNESIUM SULFATE 2 GM/50ML IV SOLN
2.0000 g | Freq: Once | INTRAVENOUS | Status: DC
  Filled 2021-09-26: qty 50

## 2021-09-26 MED ORDER — ACETAMINOPHEN 325 MG PO TABS
650.0000 mg | ORAL_TABLET | Freq: Once | ORAL | Status: AC
  Administered 2021-09-26: 650 mg via ORAL
  Filled 2021-09-26: qty 2

## 2021-09-26 NOTE — Discharge Instructions (Signed)
It was a pleasure caring for you today in the emergency department. ° °Please return to the emergency department for any worsening or worrisome symptoms. ° ° °

## 2021-09-26 NOTE — ED Provider Notes (Signed)
Signed out to me is pending repeat troponin.  Troponins are flat.  On my exam patient is feeling much better denies any chest pain at this time.  Discharged home in stable condition, advised outpatient follow-up with her doctor within the week.  Advising immediate return for worsening pain or any additional concerns.   Luna Fuse, MD 09/26/21 1650

## 2021-09-26 NOTE — ED Provider Notes (Signed)
[facility] EMERGENCY DEPARTMENT Provider Note   CSN: [id number] Arrival date & time: 09/26/21  1245     History {Add pertinent medical, surgical, social history, OB history to HPI:1} Chief Complaint  Patient presents with   Chest Pain    [name] [name] is a 41 y.o. female.  Patient as above with significant medical history as below, including preeclampsia, migraine headaches, anemia anxiety depression who presents to the ED with complaint of headache, chest discomfort.  Patient postpartum approximately 2 months.  Pregnancy complicated by preeclampsia, she is compliant with her antihypertensive.  Reports headache over the past 2 to 3 days, similar to prior migraines.  Some mild nausea, no vomiting.  She was previously on Fioricet but no longer taking secondary to breast-feeding status.  Headache not relieved with Tylenol.  No numbness or tingling.  No falls or head injuries.  7 chest discomfort described as a burning sensation, worsened with p.o. intake, worsened when lying flat.  No medications for this prior to arrival.  She is also having some leg cramps.  Bilateral.  No numbness.   IUD +, breastfeeding +   Past Medical History:  Diagnosis Date   ADHD    Anemia    Anxiety    Depression    Gestational hypertension 2017   Headache    Migraines   History of kidney stones    Kidney infection 2009   Perinatal depression    Preeclampsia, severe, third trimester 05/19/2019   Noted on arrival to [facility]   Renal mass    Supervision of high risk pregnancy, antepartum 12/25/2020      Nursing Staff Provider Office Location [facility] Dating  [place] @ 22w4dLanguage  English Anatomy [place]  Flu Vaccine  declined Genetic/Carrier Screen  NIPS:   Declined AFP:   Negative Horizon: Negative CF/Sickle Cell 05/02/15 Negative SMA TDaP Vaccine  05/23/21 Hgb A1C or  GTT Early: 4.9 Third trimester  COVID Vaccine Pfizer (2)   LAB RESULTS  Rhogam  N/A Blood Type --/--/B POS (04/27 1035)  Baby Feeding Plan     Past Surgical History:  Procedure Laterality Date   DILATION AND EVACUATION N/A 11/27/2017   Procedure: DILATATION AND EVACUATION;  Surgeon: AOsborne Oman MD;  Location: [facility];  Service: Gynecology;  Laterality: N/A;   FEMUR FRACTURE SURGERY Left    metal rod   LEG SURGERY Left 1999   steel rod in left femur bone    ROBOT ASSISTED LAPAROSCOPIC NEPHRECTOMY Right 07/21/2020   Procedure: XI ROBOTIC ASSISTED LAPAROSCOPIC RADICAL NEPHRECTOMY;  Surgeon: WCeasar Mons MD;  Location: [facility];  Service: Urology;  Laterality: Right;     The history is provided by the patient and the spouse. No language interpreter was used.  Chest Pain Associated symptoms: headache   Associated symptoms: no abdominal pain, no cough, no dysphagia, no fever, no nausea, no palpitations, no shortness of breath and no vomiting        Home Medications Prior to Admission medications   Medication Sig Start Date End Date Taking? Authorizing Provider  acetaminophen (TYLENOL) 325 MG tablet Take 2 tablets (650 mg total) by mouth every 4 (four) hours as needed (for pain scale < 4). Patient not taking: Reported on 09/05/2021 07/29/21   DRadene Gunning MD  fluticasone ([facility] 50 MCG/ACT nasal spray Place into both nostrils daily.    [provider]  NIFEdipine (ADALAT CC) 60 MG 24 hr tablet Take 1 tablet (60 mg total) by mouth daily.  09/05/21   Gabriel Carina, CNM  oxyCODONE (OXY IR/ROXICODONE) 5 MG immediate release tablet Take 1 tablet (5 mg total) by mouth every 4 (four) hours as needed (pain scale 4-7). Patient not taking: Reported on 09/05/2021 07/29/21   Radene Gunning, MD  Prenatal MV-Min-Fe Fum-FA-DHA (PRENATAL 1 PO) Take 1 tablet by mouth daily.    [provider]  sertraline (ZOLOFT) 25 MG tablet Take 1 tablet (25 mg total) by mouth daily. 03/28/21   Gabriel Carina, CNM      Allergies    Lisinopril, Sulfa antibiotics, Bactrim [sulfamethoxazole-trimethoprim], Dust mite  extract, and Penicillins    Review of Systems   Review of Systems  Constitutional:  Negative for chills and fever.  HENT:  Negative for facial swelling and trouble swallowing.   Eyes:  Negative for photophobia and visual disturbance.  Respiratory:  Negative for cough and shortness of breath.   Cardiovascular:  Positive for chest pain. Negative for palpitations.  Gastrointestinal:  Negative for abdominal pain, nausea and vomiting.  Endocrine: Negative for polydipsia and polyuria.  Genitourinary:  Negative for difficulty urinating and hematuria.  Musculoskeletal:  Positive for myalgias. Negative for gait problem, joint swelling and neck stiffness.  Skin:  Negative for pallor and rash.  Neurological:  Positive for headaches. Negative for syncope.  Psychiatric/Behavioral:  Negative for agitation and confusion.     Physical Exam Updated Vital Signs BP 122/77   Pulse 85   Temp 98.2 F (36.8 C) (Oral)   Resp 19   SpO2 99%  Physical Exam Vitals and nursing note reviewed.  Constitutional:      General: She is not in acute distress.    Appearance: Normal appearance. She is well-developed. She is not ill-appearing, toxic-appearing or diaphoretic.  HENT:     Head: Normocephalic and atraumatic. No raccoon eyes, Battle's sign, right periorbital erythema or left periorbital erythema.     Jaw: There is normal jaw occlusion.     Right Ear: External ear normal.     Left Ear: External ear normal.     Nose: Nose normal.     Mouth/Throat:     Mouth: Mucous membranes are moist.  Eyes:     General: No scleral icterus.       Right eye: No discharge.        Left eye: No discharge.     Extraocular Movements: Extraocular movements intact.     Pupils: Pupils are equal, round, and reactive to light.  Cardiovascular:     Rate and Rhythm: Normal rate and regular rhythm.     Pulses: Normal pulses.     Heart sounds: Normal heart sounds.  Pulmonary:     Effort: Pulmonary effort is normal. No  tachypnea or respiratory distress.     Breath sounds: Normal breath sounds. No decreased breath sounds or wheezing.  Abdominal:     General: Abdomen is flat.     Palpations: Abdomen is soft.     Tenderness: There is no abdominal tenderness. There is no guarding or rebound.  Musculoskeletal:        General: Normal range of motion.     Cervical back: Normal range of motion.     Right lower leg: No edema.     Left lower leg: No edema.  Skin:    General: Skin is warm and dry.     Capillary Refill: Capillary refill takes less than 2 seconds.  Neurological:     Mental Status: She is alert and oriented  to person, place, and time.     GCS: GCS eye subscore is 4. GCS verbal subscore is 5. GCS motor subscore is 6.     Cranial Nerves: Cranial nerves 2-12 are intact. No dysarthria or facial asymmetry.     Sensory: Sensation is intact.     Motor: Motor function is intact. No tremor.     Coordination: Coordination is intact. Finger-Nose-Finger Test normal.  Psychiatric:        Mood and Affect: Mood normal.        Behavior: Behavior normal.     ED Results / Procedures / Treatments   Labs (all labs ordered are listed, but only abnormal results are displayed) Labs Reviewed  CBC  BRAIN NATRIURETIC PEPTIDE  PREGNANCY, URINE  URINALYSIS, ROUTINE W REFLEX MICROSCOPIC  LIPASE, BLOOD  BASIC METABOLIC PANEL  HEPATIC FUNCTION PANEL  TROPONIN I (HIGH SENSITIVITY)  TROPONIN I (HIGH SENSITIVITY)    EKG None  Radiology DG Chest Portable 1 View  Result Date: 09/26/2021 CLINICAL DATA:  Chest pain since last evening. Patient is 8 weeks postpartum. EXAM: PORTABLE CHEST 1 VIEW COMPARISON:  05/17/2021 FINDINGS: The cardiac silhouette, mediastinal and hilar contours are normal. The lungs are clear. The bony thorax is intact. IMPRESSION: No acute cardiopulmonary findings. Electronically Signed   By: Marijo Sanes M.D.   On: 09/26/2021 13:25    Procedures Procedures  {Document cardiac monitor,  telemetry assessment procedure when appropriate:1}  Medications Ordered in ED Medications  lidocaine (LIDODERM) 5 % 1 patch (1 patch Transdermal Patch Applied 09/26/21 1452)  sodium chloride 0.9 % bolus 1,000 mL (1,000 mLs Intravenous New Bag/Given 09/26/21 1428)  famotidine (PEPCID) IVPB 20 mg premix (20 mg Intravenous New Bag/Given 09/26/21 1432)  metoCLOPramide (REGLAN) injection 5 mg (5 mg Intravenous Given 09/26/21 1424)  acetaminophen (TYLENOL) tablet 650 mg (650 mg Oral Given 09/26/21 1423)  alum & mag hydroxide-simeth (MAALOX/MYLANTA) 200-200-20 MG/5ML suspension 30 mL (30 mLs Oral Given 09/26/21 1423)    And  lidocaine (XYLOCAINE) 2 % viscous mouth solution 15 mL (15 mLs Oral Given 09/26/21 1424)    ED Course/ Medical Decision Making/ A&P                           Medical Decision Making Amount and/or Complexity of Data Reviewed Labs: ordered. Radiology: ordered.  Risk OTC drugs. Prescription drug management.    CC: Chest pain, headache  This patient presents to the Emergency Department for the above complaint. This involves an extensive number of treatment options and is a complaint that carries with it a high risk of complications and morbidity. Vital signs were reviewed. Serious etiologies considered.  Differential includes all life-threatening causes for chest pain. This includes but is not exclusive to acute coronary syndrome, aortic dissection, pulmonary embolism, cardiac tamponade, community-acquired pneumonia, pericarditis, musculoskeletal chest wall pain, etc.  Differential diagnosis includes but is not exclusive to acute cholecystitis, intrathoracic causes for epigastric abdominal pain, gastritis, duodenitis, pancreatitis, small bowel or large bowel obstruction, abdominal aortic aneurysm, hernia, gastritis, etc.  Differential diagnosis includes but is not exclusive to subarachnoid hemorrhage, meningitis, encephalitis, previous head trauma, cavernous venous thrombosis,  muscle tension headache, glaucoma, temporal arteritis, migraine or migraine equivalent, etc.  Record review:  Previous records obtained and reviewed prior office visits, prior labs and imaging, home medications  Additional history obtained from spouse at bedside  Medical and surgical history as noted above.   Work up as above, notable for:  Labs & imaging results that were available during my care of the patient were visualized by me and considered in my medical decision making.  Physical exam as above.   I ordered imaging studies which included chest x-ray. I visualized the imaging, interpreted images, and I agree with radiologist interpretation.  No acute process  Cardiac monitoring reviewed and interpreted personally which shows NSR  Labs reviewed, BNP is not elevated, troponin is 4.  CBC unremarkable.  Pending remainder of laboratory evaluation at time of shift change.  Management: Given migraine cocktail and GI cocktail.  Reports significant premature symptoms.  ED Course:     Reassessment:  Patient feeling much better after intervention.  Admission was considered.   Chest pain atypical in nature, correlates more closely with gastritis versus GERD.  Patient is pending remainder of lab evaluation.  Signed out to incoming EDP pending lab results and reassessment.  Patient is HDS.            Social determinants of health include -  Social History   Socioeconomic History   Marital status: Married    Spouse name: [name]   Number of children: 2   Years of education: college   Highest education level: Master's degree (e.g., MA, MS, MEng, MEd, MSW, MBA)  Occupational History   Occupation: Social Work    Comment: Surveyor, minerals  Tobacco Use   Smoking status: Never   Smokeless tobacco: Never  Vaping Use   Vaping Use: Never used  Substance and Sexual Activity   Alcohol use: Not Currently   Drug use: No   Sexual activity: Not Currently  Other Topics Concern    Not on file  Social History Narrative   Lives with spouse, child   Caffeine- 4 x weekly, 8 oz c coffee   Social Determinants of Health   Financial Resource Strain: Unknown (10/13/2018)   Overall Financial Resource Strain (CARDIA)    Difficulty of Paying Living Expenses: Patient refused  Food Insecurity: No Food Insecurity (07/25/2021)   Hunger Vital Sign    Worried About Running Out of Food in the Last Year: Never true    Ran Out of Food in the Last Year: Never true  Transportation Needs: No Transportation Needs (07/25/2021)   PRAPARE - Hydrologist (Medical): No    Lack of Transportation (Non-Medical): No  Physical Activity: Unknown (10/13/2018)   Exercise Vital Sign    Days of Exercise per Week: Patient refused    Minutes of Exercise per Session: Patient refused  Stress: Unknown (10/13/2018)   Placer    Feeling of Stress : Patient refused  Social Connections: Unknown (10/13/2018)   Social Connection and Isolation Panel [NHANES]    Frequency of Communication with Friends and Family: Patient refused    Frequency of Social Gatherings with Friends and Family: Patient refused    Attends Religious Services: Patient refused    Active Member of Clubs or Organizations: Patient refused    Attends Archivist Meetings: Patient refused    Marital Status: Patient refused  Intimate Partner Violence: Not At Risk (10/13/2018)   Humiliation, Afraid, Rape, and Kick questionnaire    Fear of Current or Ex-Partner: No    Emotionally Abused: No    Physically Abused: No    Sexually Abused: No      This chart was dictated using voice recognition software.  Despite best efforts to proofread,  errors can occur  which can change the documentation meaning.   {Document critical care time when appropriate:1} {Document review of labs and clinical decision tools ie heart score, Chads2Vasc2 etc:1}   {Document your independent review of radiology images, and any outside records:1} {Document your discussion with family members, caretakers, and with consultants:1} {Document social determinants of health affecting pt's care:1} {Document your decision making why or why not admission, treatments were needed:1} Final Clinical Impression(s) / ED Diagnoses Final diagnoses:  None    Rx / DC Orders ED Discharge Orders     None

## 2021-09-26 NOTE — ED Triage Notes (Addendum)
Pt reports lightheadedness, nausea, shortness of breath, leg cramping, migraine and chest pain that started 2 days ago. Had a baby 8 weeks ago. On BP meds for post partum preeclampsia.

## 2021-10-01 ENCOUNTER — Ambulatory Visit: Payer: 59

## 2021-10-01 NOTE — Therapy (Deleted)
OUTPATIENT PHYSICAL THERAPY FEMALE PELVIC EVALUATION   Patient Name: [name] [name] MRN: [id number] DOB:05/27/1980, 41 y.o., female Today's Date: 10/01/2021    Past Medical History:  Diagnosis Date   ADHD    Anemia    Anxiety    Depression    Gestational hypertension 2017   Headache    Migraines   History of kidney stones    Kidney infection 2009   Perinatal depression    Preeclampsia, severe, third trimester 05/19/2019   Noted on arrival to [facility]U   Renal mass    Supervision of high risk pregnancy, antepartum 12/25/2020      Nursing Staff Provider Office Location [facility] Dating  [place] @ 80w4dLanguage  English Anatomy [place]  Flu Vaccine  declined Genetic/Carrier Screen  NIPS:   Declined AFP:   Negative Horizon: Negative CF/Sickle Cell 05/02/15 Negative SMA TDaP Vaccine  05/23/21 Hgb A1C or  GTT Early: 4.9 Third trimester  COVID Vaccine Pfizer (2)   LAB RESULTS  Rhogam  N/A Blood Type --/--/B POS (04/27 1035)  Baby Feeding Plan   Past Surgical History:  Procedure Laterality Date   DILATION AND EVACUATION N/A 11/27/2017   Procedure: DILATATION AND EVACUATION;  Surgeon: AOsborne Oman MD;  [facility];  Service: Gynecology;  Laterality: N/A;   FEMUR FRACTURE SURGERY Left    metal rod   LEG SURGERY Left 1999   steel rod in left femur bone    ROBOT ASSISTED LAPAROSCOPIC NEPHRECTOMY Right 07/21/2020   Procedure: XI ROBOTIC ASSISTED LAPAROSCOPIC RADICAL NEPHRECTOMY;  Surgeon: WCeasar Mons MD;  Location: [facility];  Service: Urology;  Laterality: Right;   Patient Active Problem List   Diagnosis Date Noted   History of right radical nephrectomy 02/03/2021   History of renal cell carcinoma 07/21/2020   History of depression 04/29/2019   Chronic migraine 03/26/2019   Penicillin allergy 03/18/2019   Uterine fibroid in pregnancy 10/21/2018    PCP: ***  REFERRING PROVIDER: ***  REFERRING DIAG: ***  THERAPY DIAG:  No diagnosis found.  Rationale for Evaluation and Treatment  {HABREHAB:27488}  ONSET DATE: ***  SUBJECTIVE:                                                                                                                                                                                           SUBJECTIVE STATEMENT: *** Fluid intake: {Yes/No:304960894}    PAIN:  Are you having pain? {yes/no:20286} NPRS scale: ***/10 Pain location: {pelvic pain location:27098}  Pain type: {type:313116} Pain description: {PAIN DESCRIPTION:21022940}   Aggravating factors: *** Relieving factors: ***  PRECAUTIONS: {Therapy precautions:24002}  WEIGHT BEARING RESTRICTIONS {Yes ***/  No:24003}  FALLS:  Has patient fallen in last 6 months? {fallsyesno:27318}  LIVING ENVIRONMENT: Lives with: {OPRC lives with:25569::"lives with their family"} Lives in: {Lives in:25570} Stairs: {opstairs:27293} Has following equipment at home: {Assistive devices:23999}  OCCUPATION: ***  PLOF: {PLOF:24004}  PATIENT GOALS ***  PERTINENT HISTORY:  *** Sexual abuse: {Yes/No:304960894}  BOWEL MOVEMENT Pain with bowel movement: {yes/no:20286} Type of bowel movement:{PT BM type:27100} Fully empty rectum: {Yes/No:304960894} Leakage: {Yes/No:304960894} Pads: {Yes/No:304960894} Fiber supplement: {Yes/No:304960894}  URINATION Pain with urination: {yes/no:20286} Fully empty bladder: {Yes/No:304960894} Stream: {PT urination:27102} Urgency: {Yes/No:304960894} Frequency: *** Leakage: {PT leakage:27103} Pads: {Yes/No:304960894}  INTERCOURSE Pain with intercourse: {pain with intercourse PA:27099} Ability to have vaginal penetration:  {Yes/No:304960894} Climax: *** Marinoff Scale: ***/3  PREGNANCY Vaginal deliveries *** Tearing {Yes***/No:304960894} C-section deliveries *** Currently pregnant {Yes***/No:304960894}  PROLAPSE {PT prolapse:27101}    OBJECTIVE:   DIAGNOSTIC FINDINGS:  ***  PATIENT SURVEYS:  {rehab surveys:24030}  PFIQ-7  ***  COGNITION:  Overall cognitive status: {cognition:24006}     SENSATION:  Light touch: {intact/deficits:24005}  Proprioception: {intact/deficits:24005}  MUSCLE LENGTH: Hamstrings: Right *** deg; Left *** deg Thomas test: Right *** deg; Left *** deg  LUMBAR SPECIAL TESTS:  {lumbar special test:25242}  FUNCTIONAL TESTS:  {Functional tests:24029}  GAIT: Distance walked: *** Assistive device utilized: {Assistive devices:23999} Level of assistance: {Levels of assistance:24026} Comments: ***               POSTURE: {posture:25561}   PELVIC ALIGNMENT:  LUMBARAROM/PROM  A/PROM A/PROM  eval  Flexion   Extension   Right lateral flexion   Left lateral flexion   Right rotation   Left rotation    (Blank rows = not tested)  LOWER EXTREMITY ROM:  {AROM/PROM:27142} ROM Right eval Left eval  Hip flexion    Hip extension    Hip abduction    Hip adduction    Hip internal rotation    Hip external rotation    Knee flexion    Knee extension    Ankle dorsiflexion    Ankle plantarflexion    Ankle inversion    Ankle eversion     (Blank rows = not tested)  LOWER EXTREMITY MMT:  MMT Right eval Left eval  Hip flexion    Hip extension    Hip abduction    Hip adduction    Hip internal rotation    Hip external rotation    Knee flexion    Knee extension    Ankle dorsiflexion    Ankle plantarflexion    Ankle inversion    Ankle eversion      PALPATION:   General  ***                External Perineal Exam ***                             Internal Pelvic Floor ***  Patient confirms identification and approves PT to assess internal pelvic floor and treatment {yes/no:20286}  PELVIC MMT:   MMT eval  Vaginal   Internal Anal Sphincter   External Anal Sphincter   Puborectalis   Diastasis Recti   (Blank rows = not tested)        TONE: ***  PROLAPSE: ***  TODAY'S TREATMENT 10/01/21 EVAL  Manual: Soft tissue mobilization: Scar tissue  mobilization: Myofascial release: Spinal mobilization: Internal pelvic floor techniques: Dry needling: Neuromuscular re-education: Core retraining:  Core facilitation: Form correction: Pelvic floor contraction training: Down training: Exercises: Stretches/mobility: Strengthening:  Therapeutic activities: Functional strengthening activities: Self-care:     PATIENT EDUCATION:  Education details: *** Person educated: Patient Education method: Consulting civil engineer, Demonstration, Tactile cues, Verbal cues, and Handouts Education comprehension: verbalized understanding  *Pt informed about Friday Health Plan not being taken after this month and she will be converted to self-pay starting 11/16/2021 if she does not have other insurance at that time.   HOME EXERCISE PROGRAM: ***  ASSESSMENT:  CLINICAL IMPRESSION: Patient is a 41 y.o. female who was seen today for physical therapy evaluation and treatment for ***.    OBJECTIVE IMPAIRMENTS {opptimpairments:25111}.   ACTIVITY LIMITATIONS {activitylimitations:27494}  PARTICIPATION LIMITATIONS: {participationrestrictions:25113}  PERSONAL FACTORS {Personal factors:25162} are also affecting patient's functional outcome.   REHAB POTENTIAL: {rehabpotential:25112}  CLINICAL DECISION MAKING: {clinical decision making:25114}  EVALUATION COMPLEXITY: {Evaluation complexity:25115}   GOALS: Goals reviewed with patient? Yes  SHORT TERM GOALS: Target date: 10/29/2021  Pt will be independent with HEP.   Baseline: Goal status: INITIAL  2.  *** Baseline:  Goal status: {GOALSTATUS:25110}  3.  *** Baseline:  Goal status: {GOALSTATUS:25110}  4.  *** Baseline:  Goal status: {GOALSTATUS:25110}  5.  *** Baseline:  Goal status: {GOALSTATUS:25110}  6.  *** Baseline:  Goal status: {GOALSTATUS:25110}  LONG TERM GOALS: Target date: 12/24/2021   Pt will be independent with advanced HEP.   Baseline:  Goal status: INITIAL  2.   *** Baseline:  Goal status: {GOALSTATUS:25110}  3.  *** Baseline:  Goal status: {GOALSTATUS:25110}  4.  *** Baseline:  Goal status: {GOALSTATUS:25110}  5.  *** Baseline:  Goal status: {GOALSTATUS:25110}  6.  *** Baseline:  Goal status: {GOALSTATUS:25110}  PLAN: PT FREQUENCY: {rehab frequency:25116}  PT DURATION: {rehab duration:25117}  PLANNED INTERVENTIONS: Therapeutic exercises, Therapeutic activity, Neuromuscular re-education, Balance training, Gait training, Patient/Family education, Self Care, Joint mobilization, Dry Needling, Biofeedback, and Manual therapy  PLAN FOR NEXT SESSION: ***   Jule Economy, PT 10/01/2021, 12:21 PM

## 2021-10-09 NOTE — Progress Notes (Unsigned)
   Subjective:   Patient Name: [name] [name], female   DOB: April 30, 1980, 41 y.o.  MRN: [id number]  HPI Patient here for an IUD check.  She had the {Blank single:19197::"Liletta","Skyla","Kyleena","Mirena","Paragard"} IUD placed 1 month ago.  She reports ***.   Review of Systems  Constitutional: Negative for fever and chills.  Gastrointestinal: Negative for abdominal pain.  Genitourinary: Negative for vaginal discharge, vaginal pain, pelvic pain and dyspareunia.        Objective:   Physical Exam  Constitutional: She appears well-developed and well-nourished.  HENT:  Head: Normocephalic and atraumatic.  Abdominal: Soft. There is no tenderness. There is no guarding.  Genitourinary: There is no rash, tenderness or lesion on the right labia. There is no rash, tenderness or lesion on the left labia. No erythema or tenderness in the vagina. No foreign body around the vagina. No signs of injury around the vagina. No vaginal discharge found.    Skin: Skin is warm and dry.  Psychiatric: She has a normal mood and affect. Her behavior is normal. Judgment and thought content normal.       Assessment & Plan:  1. IUD check up IUD in place.  Pt to call with any other problems.  Recheck in 1 year.  2. Postpartum hypertension

## 2021-10-10 ENCOUNTER — Ambulatory Visit (INDEPENDENT_AMBULATORY_CARE_PROVIDER_SITE_OTHER): Payer: 59 | Admitting: Certified Nurse Midwife

## 2021-10-10 DIAGNOSIS — O165 Unspecified maternal hypertension, complicating the puerperium: Secondary | ICD-10-CM

## 2021-10-10 DIAGNOSIS — Z975 Presence of (intrauterine) contraceptive device: Secondary | ICD-10-CM

## 2021-10-16 ENCOUNTER — Ambulatory Visit: Payer: 59

## 2021-10-19 ENCOUNTER — Ambulatory Visit: Payer: 59 | Attending: Certified Nurse Midwife | Admitting: Physical Therapy

## 2021-10-19 ENCOUNTER — Encounter: Payer: Self-pay | Admitting: Physical Therapy

## 2021-10-19 ENCOUNTER — Other Ambulatory Visit: Payer: Self-pay

## 2021-10-19 DIAGNOSIS — R293 Abnormal posture: Secondary | ICD-10-CM | POA: Insufficient documentation

## 2021-10-19 DIAGNOSIS — N393 Stress incontinence (female) (male): Secondary | ICD-10-CM | POA: Insufficient documentation

## 2021-10-19 DIAGNOSIS — R279 Unspecified lack of coordination: Secondary | ICD-10-CM | POA: Insufficient documentation

## 2021-10-19 DIAGNOSIS — M6281 Muscle weakness (generalized): Secondary | ICD-10-CM | POA: Diagnosis present

## 2021-10-19 NOTE — Patient Instructions (Signed)

## 2021-10-19 NOTE — Therapy (Signed)
OUTPATIENT PHYSICAL THERAPY FEMALE PELVIC EVALUATION   Patient Name: [name] [name] MRN: [id number] DOB:1981-01-09, 41 y.o., female Today's Date: 10/19/2021   PT End of Session - 10/19/21 1019     Visit Number 1    Date for PT Re-Evaluation 01/19/22    Authorization Type [employer] (will be getting new insure)    PT Start Time 1015    PT Stop Time 1057    PT Time Calculation (min) 42 min    Activity Tolerance Patient tolerated treatment well    Behavior During Therapy [facility] for tasks assessed/performed             Past Medical History:  Diagnosis Date   ADHD    Anemia    Anxiety    Depression    Gestational hypertension 2017   Headache    Migraines   History of kidney stones    Kidney infection 2009   Perinatal depression    Preeclampsia, severe, third trimester 05/19/2019   Noted on arrival to [facility]   Renal mass    Supervision of high risk pregnancy, antepartum 12/25/2020      Nursing Staff Provider Office Location [facility] Dating  [place] @ 72w4dLanguage  English Anatomy [place]  Flu Vaccine  declined Genetic/Carrier Screen  NIPS:   Declined AFP:   Negative Horizon: Negative CF/Sickle Cell 05/02/15 Negative SMA TDaP Vaccine  05/23/21 Hgb A1C or  GTT Early: 4.9 Third trimester  COVID Vaccine Pfizer (2)   LAB RESULTS  Rhogam  N/A Blood Type --/--/B POS (04/27 1035)  Baby Feeding Plan   Past Surgical History:  Procedure Laterality Date   DILATION AND EVACUATION N/A 11/27/2017   Procedure: DILATATION AND EVACUATION;  Surgeon: AOsborne Oman MD;  [facility];  Service: Gynecology;  Laterality: N/A;   FEMUR FRACTURE SURGERY Left    metal rod   LEG SURGERY Left 1999   steel rod in left femur bone    ROBOT ASSISTED LAPAROSCOPIC NEPHRECTOMY Right 07/21/2020   Procedure: XI ROBOTIC ASSISTED LAPAROSCOPIC RADICAL NEPHRECTOMY;  Surgeon: WCeasar Mons MD;  Location: [facility];  Service: Urology;  Laterality: Right;   Patient Active Problem List   Diagnosis Date Noted    History of right radical nephrectomy 02/03/2021   History of renal cell carcinoma 07/21/2020   History of depression 04/29/2019   Chronic migraine 03/26/2019   Penicillin allergy 03/18/2019   Uterine fibroid in pregnancy 10/21/2018    PCP: JHermine Messick MD  REFERRING PROVIDER: WGabriel Carina CNM  REFERRING DIAG: N39.3 (ICD-10-CM) - Stress incontinence  THERAPY DIAG:  Muscle weakness (generalized) - Plan: PT plan of care cert/re-cert  Unspecified lack of coordination - Plan: PT plan of care cert/re-cert  Abnormal posture - Plan: PT plan of care cert/re-cert  Rationale for Evaluation and Treatment Rehabilitation  ONSET DATE: during pregnancy with first baby 2 years ago   SUBJECTIVE:  SUBJECTIVE STATEMENT: Has been having incontinence with sneezing/laughing/coughing and has a hard time feeling if she is kegeling. Pt also reports central low back pain since having epidural with second baby.  Was no leakage between first birth until ~5 months pregnant with second.  Did have radical nephrectomy in 05/2020 and 10/22 got pregnant with second baby.   Fluid intake: Yes: at least 1.5 gallons daily; Gatorade, 1 cup of coffee a day     PAIN:  Are you having pain? Yes NPRS scale: 10/10 in the morning at worst and this makes it everything hurt (3-4 weeks now and comes and goes). 4/10 best pain which is current.   Pain location:  central low back  Pain type: sharp and throbbing Pain description: intermittent   Aggravating factors: bending over Relieving factors: resting  PRECAUTIONS: Other: 3 months postpartum   WEIGHT BEARING RESTRICTIONS No  FALLS:  Has patient fallen in last 6 months? No  LIVING ENVIRONMENT: Lives with: lives with their family Lives in: House/apartment   OCCUPATION:  Education officer, museum   PLOF: Independent  PATIENT GOALS to have less leakage  PERTINENT HISTORY:  right radical nephrectomy, renal cell carcinoma, Hypertension in pregnancy, preeclampsia; severe ;delivered,  Sexual abuse: No  BOWEL MOVEMENT Pain with bowel movement: No Type of bowel movement:Type (Bristol Stool Scale) 4, Frequency daily, and Strain No Fully empty rectum: Yes:   Leakage: No Pads: No Fiber supplement: No  URINATION Pain with urination: No Fully empty bladder: Yes: not all the time Stream: Strong Urgency: No Frequency: sometimes more than once an hour but drinking a lot of water for breastfeeding  Leakage: Coughing, Sneezing, and Laughing Pads: Yes: all the time day/night usually. 1x day and 1x night  INTERCOURSE Pain with intercourse:  not active postpartum yet   PREGNANCY Vaginal deliveries 2 Tearing Yes: yes with first baby in 2021  C-section deliveries 0 Currently pregnant No  PROLAPSE None    OBJECTIVE:   DIAGNOSTIC FINDINGS:    COGNITION:  Overall cognitive status: Within functional limits for tasks assessed     SENSATION:  Light touch: Appears intact  Proprioception: Appears intact  MUSCLE LENGTH: Bil hamstrings and adductor limited by 25%  LUMBAR SPECIAL TESTS:  Stork standing: Positive (with hip drop when standing on Rt leg and worse pain standing on this leg, but still pain standing on Lt leg. Both with noted instability), SI Compression/distraction test: Negative, and FABER test: Positive (with pain)                 POSTURE: rounded shoulders, forward head, and anterior pelvic tilt   PELVIC ALIGNMENT: level in supine and standing  LUMBARAROM/PROM  A/PROM A/PROM  eval  Flexion Limited with pain by 75%  Extension Limited with pain by 25%  Right lateral flexion Limited with pain by 50%  Left lateral flexion Limited with pain by 50%  Right rotation Limited with pain by 50%  Left rotation Limited with pain by 50%   (Blank rows =  not tested)  LOWER EXTREMITY ROM:  WFL   LOWER EXTREMITY MMT:  Rt hip abduction 3/5, flexion 3+/5, extension and adduction 4/5 (worse pain on Rt side compared to Lt); Lt hip grossly 4/5 in all directions with pain but less than Rt. Knees and ankles 5/5   PALPATION:   General  mild TTP at L3-S1 and paraspinals at these levels                External Perineal Exam no TTP,  mild dryness noted                             Internal Pelvic Floor no TTP  Patient confirms identification and approves PT to assess internal pelvic floor and treatment Yes  PELVIC MMT:   MMT eval  Vaginal 3/5; 2s; 4 reps  Internal Anal Sphincter   External Anal Sphincter   Puborectalis   Diastasis Recti   (Blank rows = not tested)        TONE: WFL  PROLAPSE: Not seen in hooklying   TODAY'S TREATMENT  EVAL Examination completed, findings reviewed, pt educated on POC, HEP, and moisturizers. Pt motivated to participate in PT and agreeable to attempt recommendations.     PATIENT EDUCATION:  Education details: [id number] Person educated: Patient Education method: Explanation, Demonstration, Tactile cues, Verbal cues, and Handouts Education comprehension: verbalized understanding and returned demonstration   HOME EXERCISE PROGRAM: [id number]  ASSESSMENT:  CLINICAL IMPRESSION: Patient is a 41 y.o. female  who was seen today for physical therapy evaluation and treatment for stress incontinence. Pt found to have pain from L3-S1 and pain with all mobility of spine and hips, decreased flexibility in spine and hips, decreased strength at hips and core. Pt (+) for pain with single leg stance with pain bil however noted more instability and pain with RT stance leg. Pt consented to internal vaginal assessment this date and found to have decreased strength, coordination, and endurance and reports decreased feeling on contractions. Pt reports she is limited in caring for children due to pain, has leakage with  sneezing/laughing/coughing. Pt would benefit from additional PT to further address deficits.      OBJECTIVE IMPAIRMENTS decreased cognition, decreased coordination, decreased mobility, decreased strength, increased fascial restrictions, increased muscle spasms, impaired flexibility, improper body mechanics, postural dysfunction, and pain.   ACTIVITY LIMITATIONS carrying, lifting, bending, standing, squatting, transfers, continence, locomotion level, and caring for others  PARTICIPATION LIMITATIONS: cleaning, laundry, interpersonal relationship, shopping, community activity, and yard work  PERSONAL FACTORS Age, Fitness, Time since onset of injury/illness/exacerbation, and 1 comorbidity: medical history   are also affecting patient's functional outcome.   REHAB POTENTIAL: Good  CLINICAL DECISION MAKING: Stable/uncomplicated  EVALUATION COMPLEXITY: Low   GOALS: Goals reviewed with patient? Yes  SHORT TERM GOALS: Target date: 11/16/2021  Pt to be I with HEP.  Baseline: Goal status: INITIAL  2.  Pt to report improved time between bladder voids to at least 1 hours for improved QOL with decreased urinary frequency.   Baseline:  Goal status: INITIAL  3.  Pt to report decreased pad use to 1 pad per day due to improved confidence in continence and decreased leakage.  Baseline:  Goal status: INITIAL  4.  Pt to demonstrate improved coordination of pelvic floor and breathing mechanics with core activation during body weight squat with no more than 2/10 pain for improved pelvic stability. Baseline:  Goal status: INITIAL  LONG TERM GOALS: Target date:  01/19/22    Pt to be I with advanced HEP.  Baseline:  Goal status: INITIAL  2.  Pt to demonstrate at least 5/5 bil hip strength for improved pelvic stability and functional squats without leakage.  Baseline:  Goal status: INITIAL  3.  Pt to report improved time between bladder voids to at least 2 hours for improved QOL with decreased  urinary frequency.   Baseline:  Goal status: INITIAL  4.  Pt to report decreased pad use  to no more than 4 pads per week due to improved confidence in continence and decreased leakage.  Baseline:  Goal status: INITIAL  5.  Pt to demonstrate improved coordination of pelvic floor and breathing mechanics with core activation during 25# squat with no more than 2/10 pain for improved pelvic stability. Baseline:  Goal status: INITIAL  6.  Pt to demonstrate at least 4/5 pelvic floor strength for improved pelvic stability and decreased strain at pelvic floor/ decrease leakage.  Baseline:  Goal status: INITIAL  PLAN: PT FREQUENCY: 1x/week  PT DURATION:  8 sessions  PLANNED INTERVENTIONS: Therapeutic exercises, Therapeutic activity, Neuromuscular re-education, Patient/Family education, Self Care, Joint mobilization, Aquatic Therapy, Dry Needling, Spinal mobilization, Cryotherapy, Moist heat, scar mobilization, Taping, Biofeedback, and Manual therapy  PLAN FOR NEXT SESSION: core and hip strengthening; pelvic floor coordination with functional tasks; breathing mechanics   Stacy Gardner, PT, DPT 10/20/2310:06 PM

## 2022-04-24 IMAGING — DX DG CHEST 2V
2 series · 2 of 2 positions shown · non-contrast
Comparison: 06/28/2020

CLINICAL DATA: Renal cell carcinoma.

EXAM:
CHEST - 2 VIEW

[chest pa]
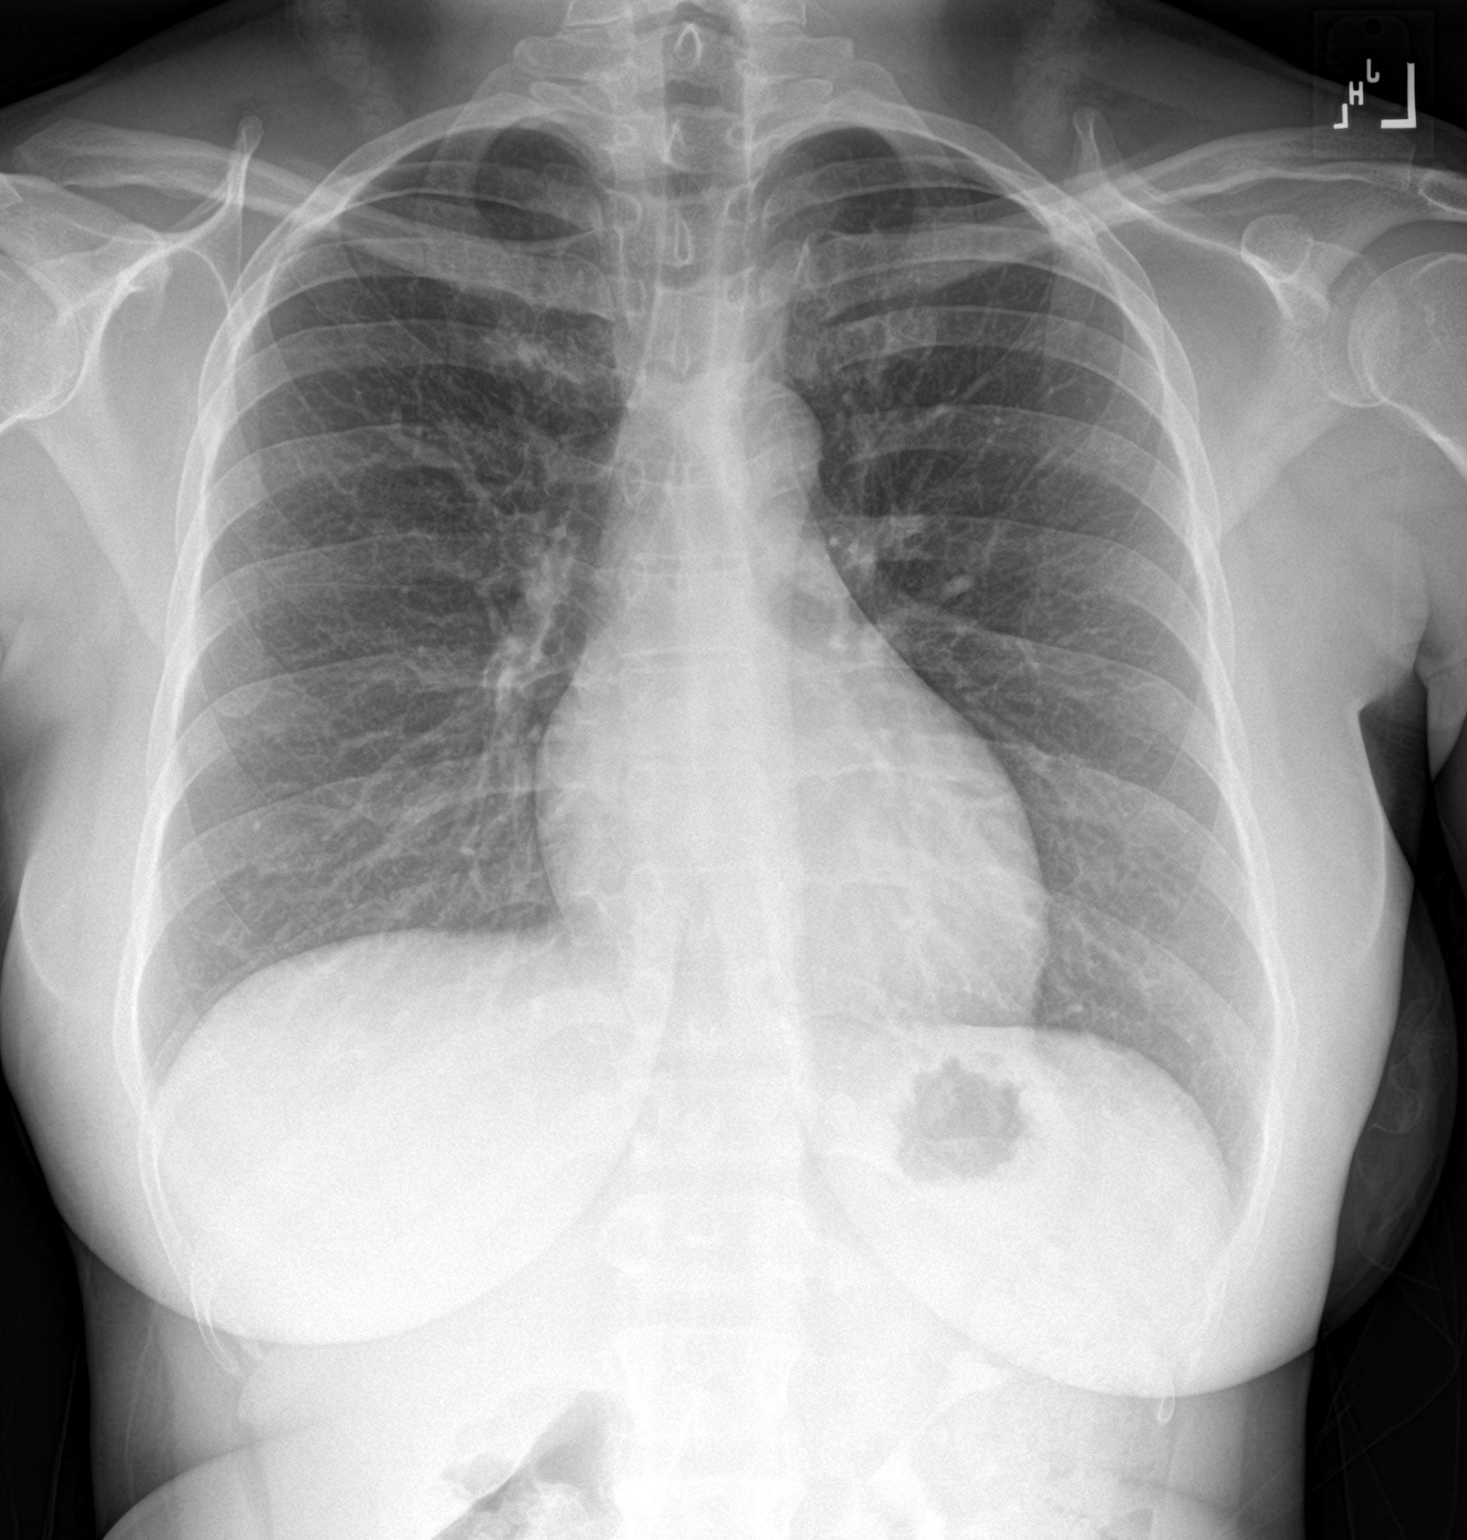

[chest lat]
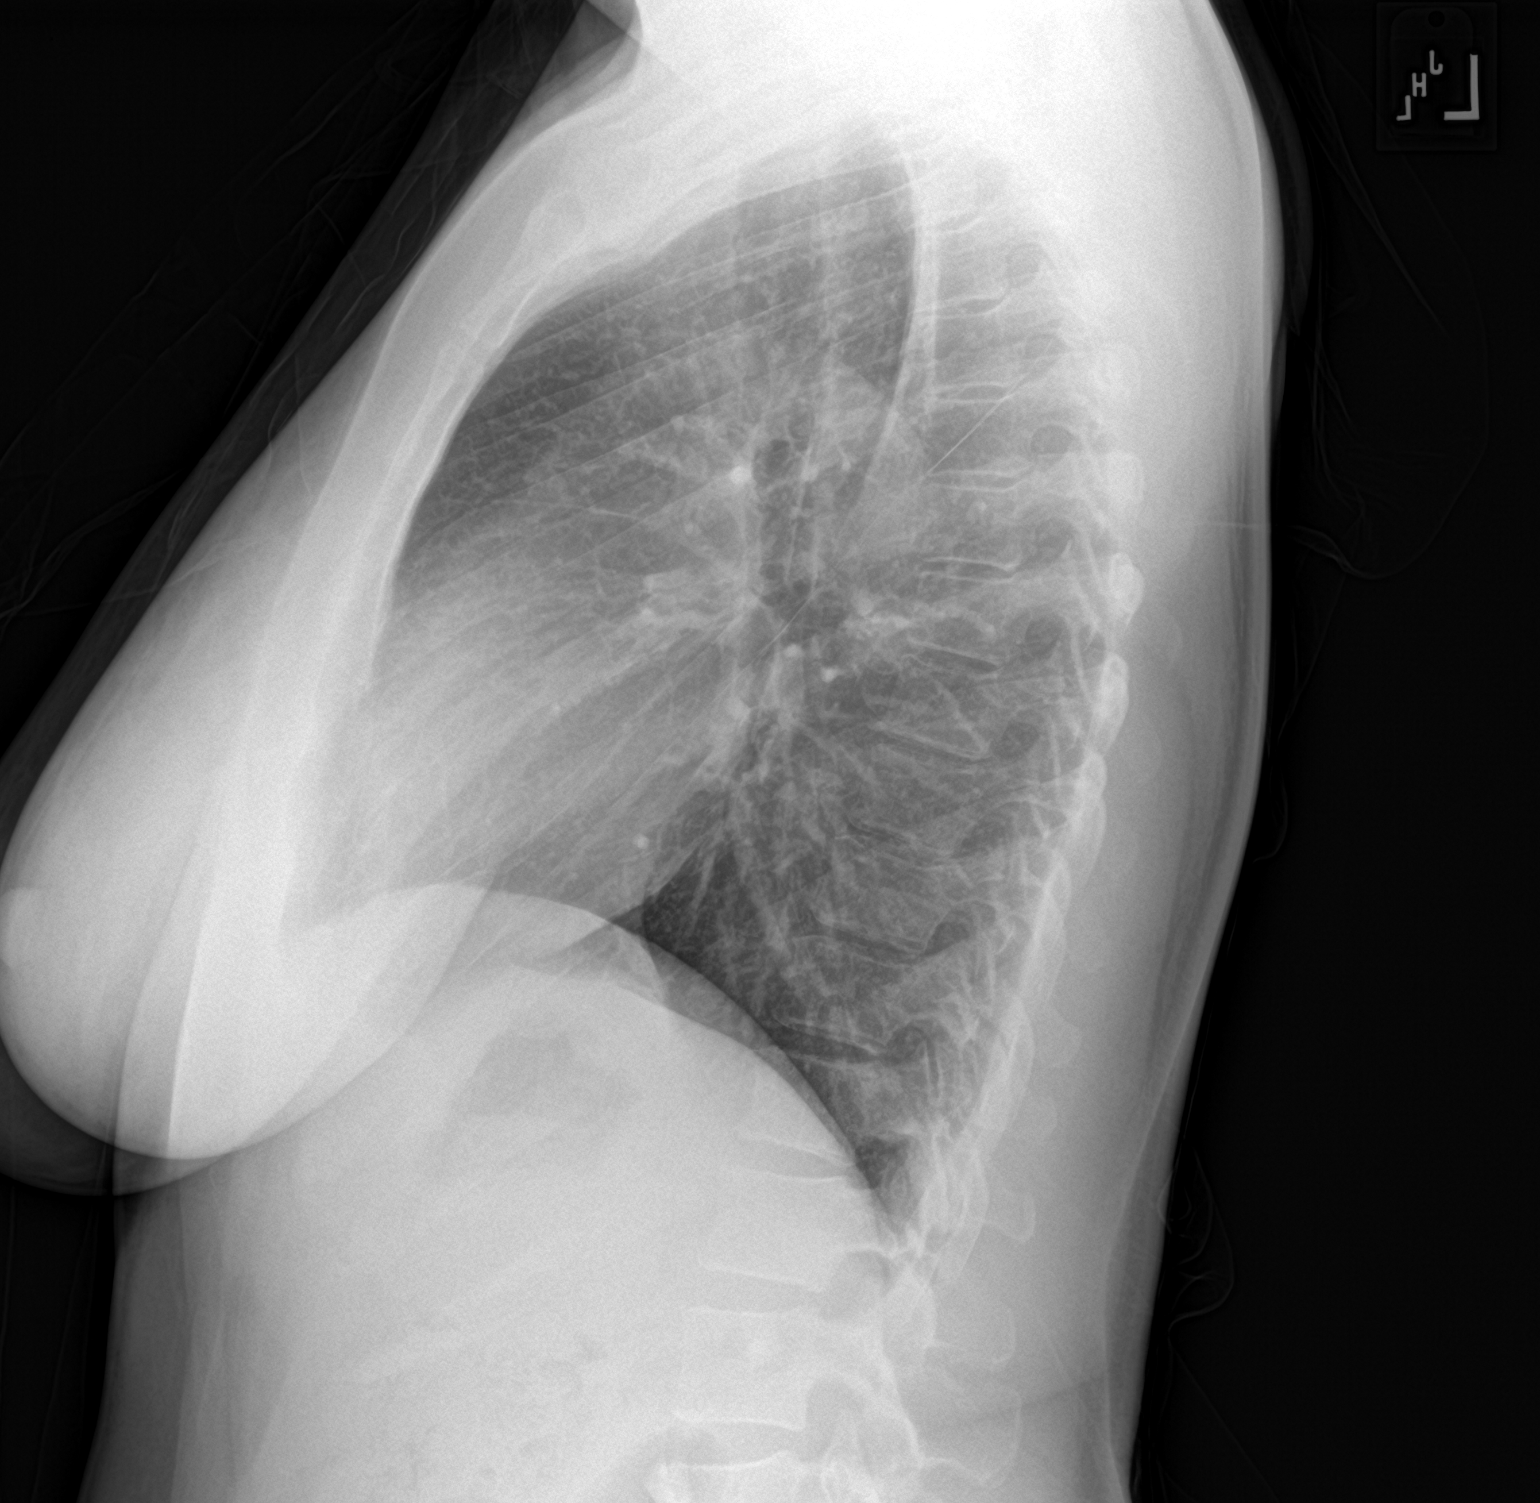

[2 of 2 positions shown; findings below may reference images not displayed]

FINDINGS: The heart size and mediastinal contours are within normal limits.
Both lungs are clear. The visualized skeletal structures are
unremarkable.
IMPRESSION: No active cardiopulmonary disease.

## 2022-07-30 ENCOUNTER — Other Ambulatory Visit: Payer: Self-pay

## 2022-07-30 ENCOUNTER — Encounter (HOSPITAL_BASED_OUTPATIENT_CLINIC_OR_DEPARTMENT_OTHER): Payer: Self-pay

## 2022-07-30 ENCOUNTER — Emergency Department (HOSPITAL_BASED_OUTPATIENT_CLINIC_OR_DEPARTMENT_OTHER)
Admission: EM | Admit: 2022-07-30 | Discharge: 2022-07-30 | Disposition: A | Discharge status: Home / Self Care | Payer: 59 | Attending: Emergency Medicine | Admitting: Emergency Medicine

## 2022-07-30 ENCOUNTER — Emergency Department (HOSPITAL_BASED_OUTPATIENT_CLINIC_OR_DEPARTMENT_OTHER): Payer: 59

## 2022-07-30 DIAGNOSIS — R030 Elevated blood-pressure reading, without diagnosis of hypertension: Secondary | ICD-10-CM

## 2022-07-30 DIAGNOSIS — I161 Hypertensive emergency: Secondary | ICD-10-CM | POA: Insufficient documentation

## 2022-07-30 DIAGNOSIS — H538 Other visual disturbances: Secondary | ICD-10-CM | POA: Insufficient documentation

## 2022-07-30 DIAGNOSIS — R519 Headache, unspecified: Secondary | ICD-10-CM | POA: Insufficient documentation

## 2022-07-30 DIAGNOSIS — Z79899 Other long term (current) drug therapy: Secondary | ICD-10-CM | POA: Insufficient documentation

## 2022-07-30 LAB — CBC WITH DIFFERENTIAL/PLATELET
Abs Immature Granulocytes: 0.01 10*3/uL (ref 0.00–0.07)
Basophils Absolute: 0 10*3/uL (ref 0.0–0.1)
Basophils Relative: 1 %
Eosinophils Absolute: 0 10*3/uL (ref 0.0–0.5)
Eosinophils Relative: 0 %
HCT: 42.1 % (ref 36.0–46.0)
Hemoglobin: 14.4 g/dL (ref 12.0–15.0)
Immature Granulocytes: 0 %
Lymphocytes Relative: 28 %
Lymphs Abs: 1.9 10*3/uL (ref 0.7–4.0)
MCH: 29.8 pg (ref 26.0–34.0)
MCHC: 34.2 g/dL (ref 30.0–36.0)
MCV: 87 fL (ref 80.0–100.0)
Monocytes Absolute: 0.5 10*3/uL (ref 0.1–1.0)
Monocytes Relative: 7 %
Neutro Abs: 4.5 10*3/uL (ref 1.7–7.7)
Neutrophils Relative %: 64 %
Platelets: 312 10*3/uL (ref 150–400)
RBC: 4.84 MIL/uL (ref 3.87–5.11)
RDW: 12.4 % (ref 11.5–15.5)
WBC: 7 10*3/uL (ref 4.0–10.5)
nRBC: 0 % (ref 0.0–0.2)

## 2022-07-30 LAB — COMPREHENSIVE METABOLIC PANEL
ALT: 17 U/L (ref 0–44)
AST: 23 U/L (ref 15–41)
Albumin: 4.8 g/dL (ref 3.5–5.0)
Alkaline Phosphatase: 68 U/L (ref 38–126)
Anion gap: 10 (ref 5–15)
BUN: 12 mg/dL (ref 6–20)
CO2: 25 mmol/L (ref 22–32)
Calcium: 9.6 mg/dL (ref 8.9–10.3)
Chloride: 102 mmol/L (ref 98–111)
Creatinine, Ser: 0.98 mg/dL (ref 0.44–1.00)
GFR, Estimated: >60 mL/min (ref >60)
Glucose, Bld: 82 mg/dL (ref 70–99)
Potassium: 4.1 mmol/L (ref 3.5–5.1)
Sodium: 137 mmol/L (ref 135–145)
Total Bilirubin: 0.6 mg/dL (ref 0.3–1.2)
Total Protein: 8.7 g/dL — ABNORMAL HIGH (ref 6.5–8.1)

## 2022-07-30 MED ORDER — AMLODIPINE BESYLATE 5 MG PO TABS: 10.0000 mg | ORAL_TABLET | Freq: Every day | ORAL | 0 refills | Status: AC

## 2022-07-30 MED ORDER — PROCHLORPERAZINE EDISYLATE 10 MG/2ML IJ SOLN
10.0000 mg | Freq: Once | INTRAMUSCULAR | Status: AC
  Administered 2022-07-30: 10 mg via INTRAVENOUS
  Filled 2022-07-30: qty 2

## 2022-07-30 MED ORDER — AMLODIPINE BESYLATE 5 MG PO TABS
10.0000 mg | ORAL_TABLET | Freq: Once | ORAL | Status: AC
  Administered 2022-07-30: 10 mg via ORAL
  Filled 2022-07-30: qty 2

## 2022-07-30 MED ORDER — SODIUM CHLORIDE 0.9 % IV BOLUS
1000.0000 mL | Freq: Once | INTRAVENOUS | Status: AC
  Administered 2022-07-30: 1000 mL via INTRAVENOUS

## 2022-07-30 MED ORDER — DIPHENHYDRAMINE HCL 50 MG/ML IJ SOLN
12.5000 mg | Freq: Once | INTRAMUSCULAR | Status: AC
Start: 2022-07-30 — End: 2022-07-30
  Administered 2022-07-30: 12.5 mg via INTRAVENOUS
  Filled 2022-07-30: qty 1

## 2022-07-30 NOTE — Discharge Instructions (Signed)
Migraines-I like you to take ibuprofen and Tylenol as needed for pain management, please remember to get plenty of sleep stay hydrated this can help decrease frequency of migraines.  I like to follow-up with the neurologist for further evaluation. High blood pressure-your blood pressure was elevated today, starting you on amlodipine please take as prescribed, I would like you to start tracking your blood pressure, it take  twice daily, if you start to notice that your blood pressure is becoming too low or you become lightheaded dizziness please go from 10 mg to 5 mg daily.  Leg follow-up your primary care doctor for further assessment.  Come back to the emergency department if you develop chest pain, shortness of breath, severe abdominal pain, uncontrolled nausea, vomiting, diarrhea.

## 2022-07-30 NOTE — ED Provider Notes (Signed)
[place] [facility] Provider Note   CSN: [id number] Arrival date & time: 07/30/22  1504     History  Chief Complaint  Patient presents with   Headache    [name] [name] is a 42 y.o. female.  HPI   Patient with medical history including preeclampsia, right-sided nephrectomy, presenting with complaints of headaches.  Patient states that headache started on Saturday, states it remains constant, does wax and wane in intensity, states she feels pressure in the front of her head and has increased photophobia.  She states that she will occasionally have blurred vision, denies any paresthesias or weakness in the upper or lower extremities, no recent head trauma, not on anticoag.  Patient states that she typically gets this when her blood pressure is high, states that she has leftover blotch medication from from when she had preeclampsia fatigue.  States after he takes she normally has a headache.  She denies any chest pain shortness of breath current peripheral edema at this time.    Home Medications Prior to Admission medications   Medication Sig Start Date End Date Taking? Authorizing Provider  amLODipine (NORVASC) 5 MG tablet Take 2 tablets (10 mg total) by mouth daily. 07/30/22 09/28/22 Yes Carroll Sage, PA-C  acetaminophen (TYLENOL) 325 MG tablet Take 2 tablets (650 mg total) by mouth every 4 (four) hours as needed (for pain scale < 4). Patient not taking: Reported on 09/05/2021 07/29/21   Milas Hock, MD  fluticasone [facility]) 50 MCG/ACT nasal spray Place into both nostrils daily.    [provider]  NIFEdipine (ADALAT CC) 60 MG 24 hr tablet Take 1 tablet (60 mg total) by mouth daily. 09/05/21   Bernerd Limbo, CNM  oxyCODONE (OXY IR/ROXICODONE) 5 MG immediate release tablet Take 1 tablet (5 mg total) by mouth every 4 (four) hours as needed (pain scale 4-7). Patient not taking: Reported on 09/05/2021 07/29/21   Milas Hock, MD   Prenatal MV-Min-Fe Fum-FA-DHA (PRENATAL 1 PO) Take 1 tablet by mouth daily.    [provider]  sertraline (ZOLOFT) 25 MG tablet Take 1 tablet (25 mg total) by mouth daily. 03/28/21   Bernerd Limbo, CNM  sucralfate (CARAFATE) 1 g tablet Take 1 tablet (1 g total) by mouth 4 (four) times daily -  with meals and at bedtime for 7 days. 09/26/21 10/03/21  Sloan Leiter, DO      Allergies    Lisinopril, Sulfa antibiotics, Bactrim [sulfamethoxazole-trimethoprim], Dust mite extract, and Penicillins    Review of Systems   Review of Systems  Constitutional:  Negative for chills and fever.  Respiratory:  Negative for shortness of breath.   Cardiovascular:  Negative for chest pain.  Gastrointestinal:  Negative for abdominal pain.  Neurological:  Positive for headaches.    Physical Exam Updated Vital Signs BP (!) 163/94   Pulse 71   Temp 98 F (36.7 C)   Resp 13   Ht 5' (1.524 m)   Wt 72.6 kg   SpO2 98%   Breastfeeding Yes   BMI 31.25 kg/m  Physical Exam Vitals and nursing note reviewed.  Constitutional:      General: She is not in acute distress.    Appearance: She is not ill-appearing.  HENT:     Head: Normocephalic and atraumatic.     Nose: No congestion.  Eyes:     Conjunctiva/sclera: Conjunctivae normal.  Cardiovascular:     Rate and Rhythm: Normal rate and regular rhythm.  Pulses: Normal pulses.     Heart sounds: No murmur heard.    No friction rub. No gallop.  Pulmonary:     Effort: No respiratory distress.     Breath sounds: No wheezing, rhonchi or rales.  Skin:    General: Skin is warm and dry.  Neurological:     Mental Status: She is alert.     GCS: GCS eye subscore is 4. GCS verbal subscore is 5. GCS motor subscore is 6.     Cranial Nerves: Cranial nerves 2-12 are intact. No cranial nerve deficit.     Sensory: Sensation is intact.     Motor: No weakness.     Comments: Cranial nerves II through XII grossly intact no difficulty with word finding  following two-step commands there is no unilateral weakness present.  Psychiatric:        Mood and Affect: Mood normal.     ED Results / Procedures / Treatments   Labs (all labs ordered are listed, but only abnormal results are displayed) Labs Reviewed  COMPREHENSIVE METABOLIC PANEL - Abnormal; Notable for the following components:      Result Value   Total Protein 8.7 (*)    All other components within normal limits  CBC WITH DIFFERENTIAL/PLATELET    EKG None  Radiology CT Head Wo Contrast  Result Date: 07/30/2022 CLINICAL DATA:  Migraine headache. EXAM: CT HEAD WITHOUT CONTRAST TECHNIQUE: Contiguous axial images were obtained from the base of the skull through the vertex without intravenous contrast. RADIATION DOSE REDUCTION: This exam was performed according to the departmental dose-optimization program which includes automated exposure control, adjustment of the mA and/or kV according to patient size and/or use of iterative reconstruction technique. COMPARISON:  None Available. FINDINGS: Brain: No evidence of acute infarction, hemorrhage, hydrocephalus, extra-axial collection or mass lesion/mass effect. Vascular: No hyperdense vessel or unexpected calcification. Skull: Normal. Negative for fracture or focal lesion. Sinuses/Orbits: No acute finding. Other: None. IMPRESSION: No acute intracranial pathology. Electronically Signed   By: Aram Candela M.D.   On: 07/30/2022 20:42    Procedures Procedures    Medications Ordered in ED Medications  sodium chloride 0.9 % bolus 1,000 mL (0 mLs Intravenous Stopped 07/30/22 2236)  prochlorperazine (COMPAZINE) injection 10 mg (10 mg Intravenous Given 07/30/22 2012)  diphenhydrAMINE (BENADRYL) injection 12.5 mg (12.5 mg Intravenous Given 07/30/22 2011)  amLODipine (NORVASC) tablet 10 mg (10 mg Oral Given 07/30/22 2124)    ED Course/ Medical Decision Making/ A&P                             Medical Decision Making Amount and/or  Complexity of Data Reviewed Labs: ordered. Radiology: ordered.  Risk Prescription drug management.   This patient presents to the ED for concern of headache, this involves an extensive number of treatment options, and is a complaint that carries with it a high risk of complications and morbidity.  The differential diagnosis includes intracranial bleed, intracranial mass, CVA, hypertensive urgency or emergency, meningitis    Additional history obtained:  Additional history obtained from N/A External records from outside source obtained and reviewed including OB/GYN notes   Co morbidities that complicate the patient evaluation  Preeclampsia  Social Determinants of Health:  No primary care provider    Lab Tests:  I Ordered, and personally interpreted labs.  The pertinent results include: CBC is unremarkable, CMP reveals total protein 8.7   Imaging Studies ordered:  I ordered imaging  studies including CT head I independently visualized and interpreted imaging which showed negative acute findings I agree with the radiologist interpretation   Cardiac Monitoring:  The patient was maintained on a cardiac monitor.  I personally viewed and interpreted the cardiac monitored which showed an underlying rhythm of: EKG without signs of ischemia   Medicines ordered and prescription drug management:  I ordered medication including migraine cocktail I have reviewed the patients home medicines and have made adjustments as needed  Critical Interventions:  N/A   Reevaluation:  Presenting with a headache, triage obtain basic lab workup which I personally reviewed is unremarkable, due to complaints of monitoring which is worse than usual, and elevated BP, concern for possible mass versus intracranial bleed will obtain CT imaging for further evaluation.  Will also provide with a migraine cocktail  Reassessed the patient resting comfortably headache has completely resolved, BP is  improving, agreement discharge at this time.   Consultations Obtained:  N/a   Test Considered:  N/a   Rule out low suspicion for internal head bleed and or mass as CT imaging is negative for acute findings.  Low suspicion for CVA she has no focal deficit present my exam.  Low suspicion for dissection of the vertebral or carotid artery as presentation atypical of etiology.  Low suspicion for meningitis as she has no meningeal sign present.  Hypertensive emergency urgency no evidence of organ damage present on lab work, her symptoms completely resolved after migraine cocktail.  BP is also trending down.     Dispostion and problem list  After consideration of the diagnostic results and the patients response to treatment, I feel that the patent would benefit from discharge.  Headache-likely a migraine, will have her continue with over-the-counter pain medications, follow-up neurology for further evaluation Elevated blood pressure-BP was significant elevated today, will start her on amlodipine, have her follow-up with her PCP for further evaluation.            Final Clinical Impression(s) / ED Diagnoses Final diagnoses:  Bad headache  Elevated blood pressure reading    Rx / DC Orders ED Discharge Orders          Ordered    amLODipine (NORVASC) 5 MG tablet  Daily        07/30/22 2243              Carroll Sage, PA-C 07/30/22 2249    Rolan Bucco, MD 07/31/22 1341

## 2022-07-30 NOTE — ED Triage Notes (Signed)
Pt reports headache that started 15 minutes after taking her BP med Nifedipine last night at 10pm. She reports she took her BP last night and it was 175/115. She also report nausea and vomiting since last night as well. She also reports the migraine is causing photosensitivity to left eye.

## 2022-07-31 ENCOUNTER — Telehealth (HOSPITAL_BASED_OUTPATIENT_CLINIC_OR_DEPARTMENT_OTHER): Payer: Self-pay | Admitting: Emergency Medicine

## 2022-11-21 IMAGING — US US MFM OB FOLLOW-UP
1 series · 13 of 20 positions shown · non-contrast
Comparison: none

[Series 1: us mfm ob follow-up · 13 of 20 slices shown]
[im 1/20]
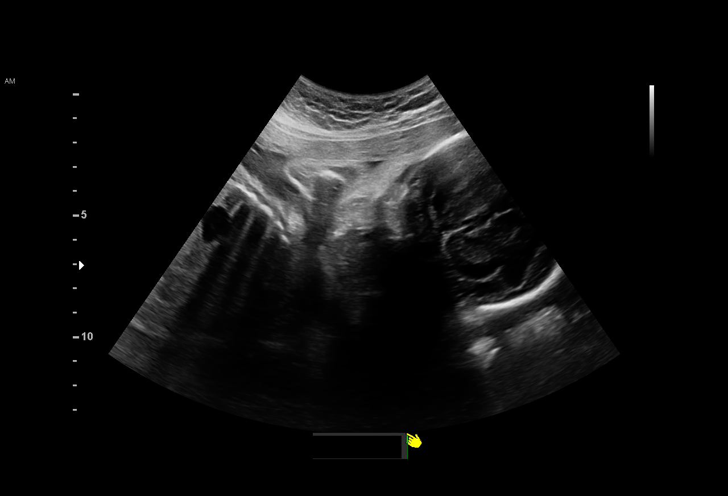
[im 3/20]
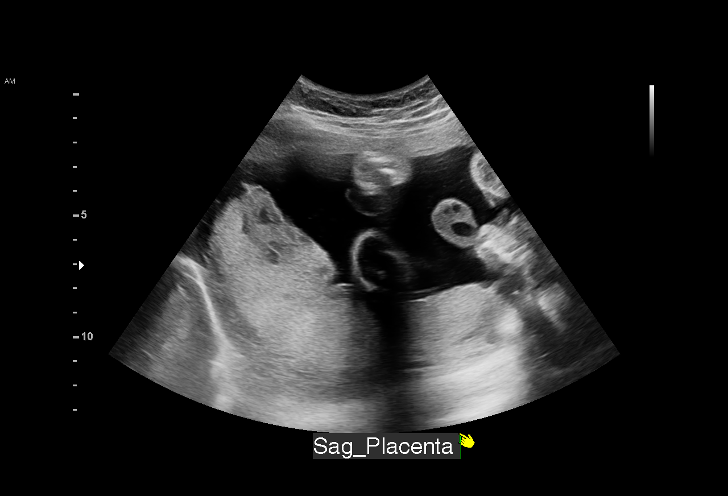
[im 4/20]
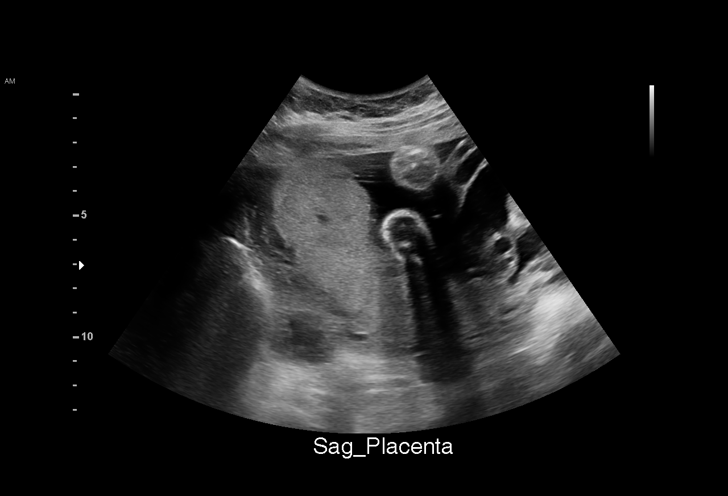
[im 6/20]
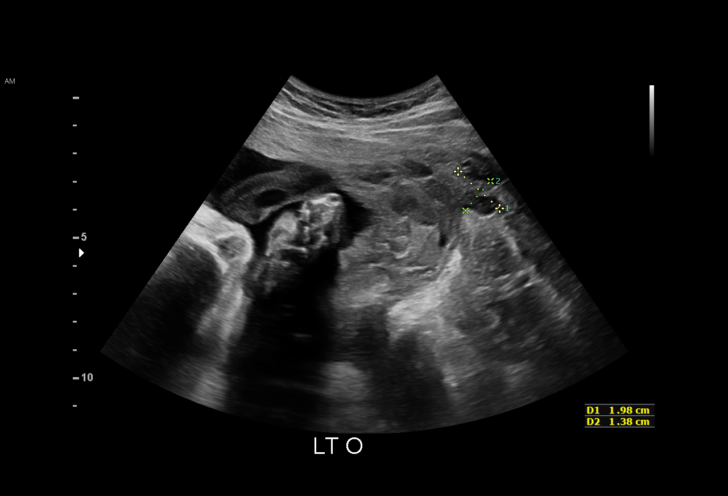
[im 7/20]
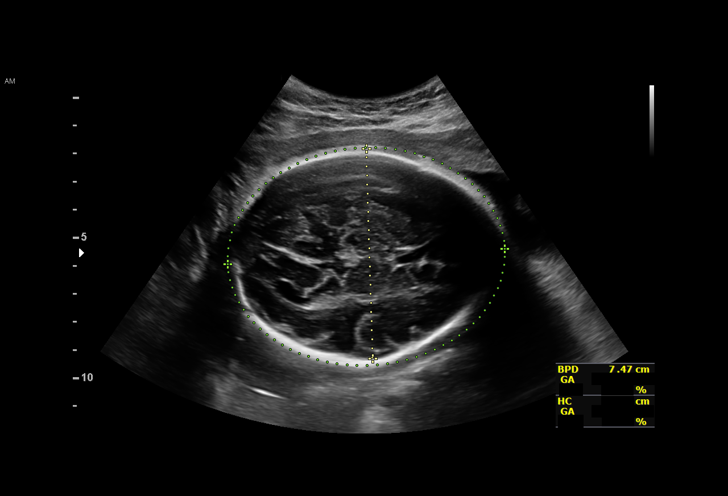
[im 9/20]
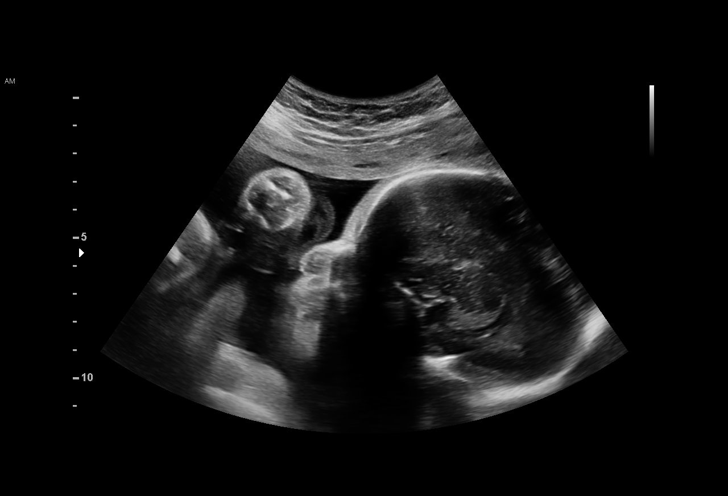
[im 11/20]
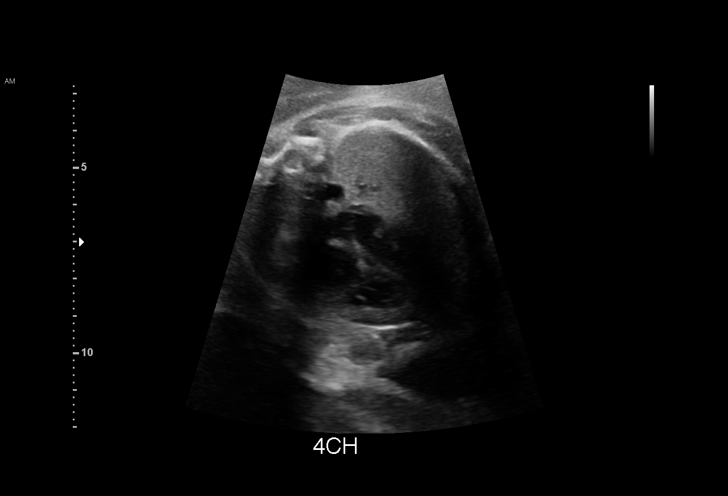
[im 12/20]
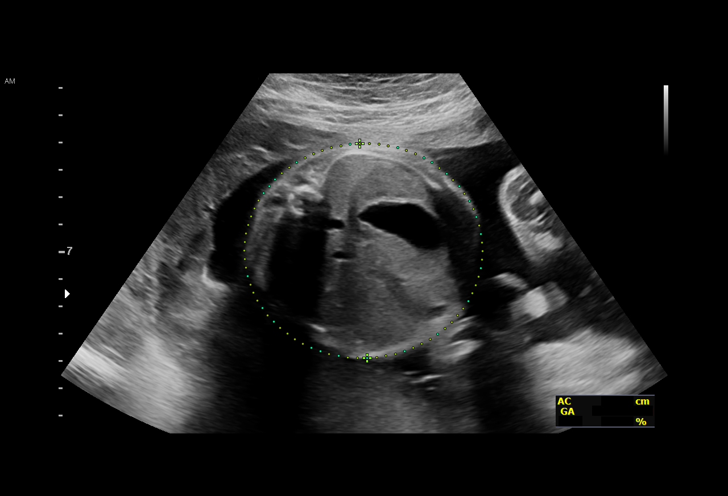
[im 14/20]
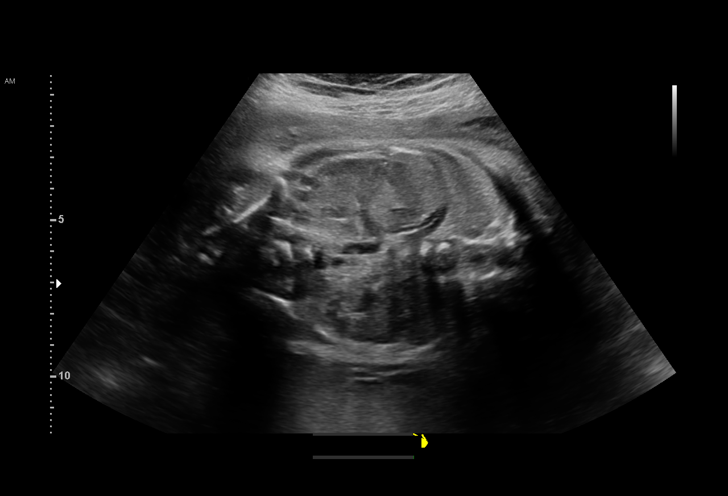
[im 15/20]
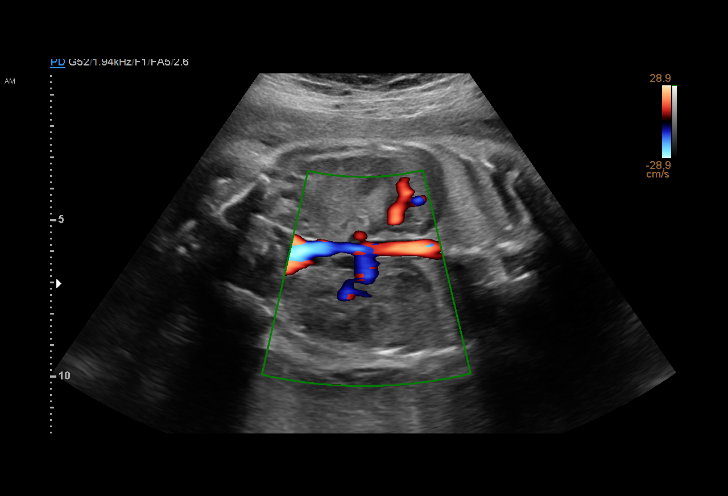
[im 17/20]
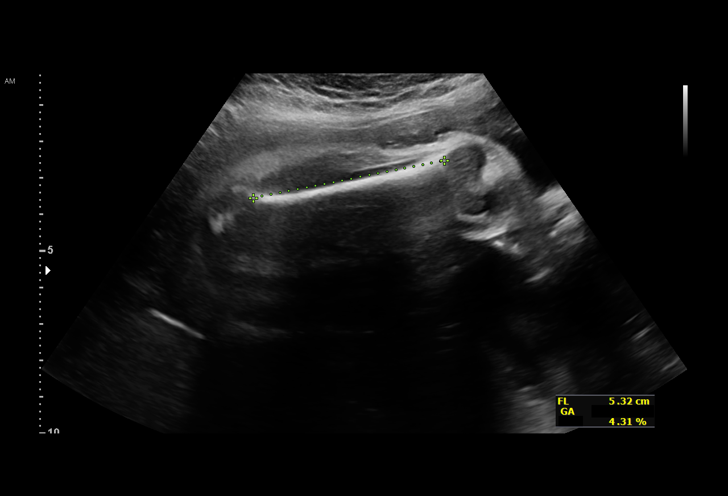
[im 18/20]
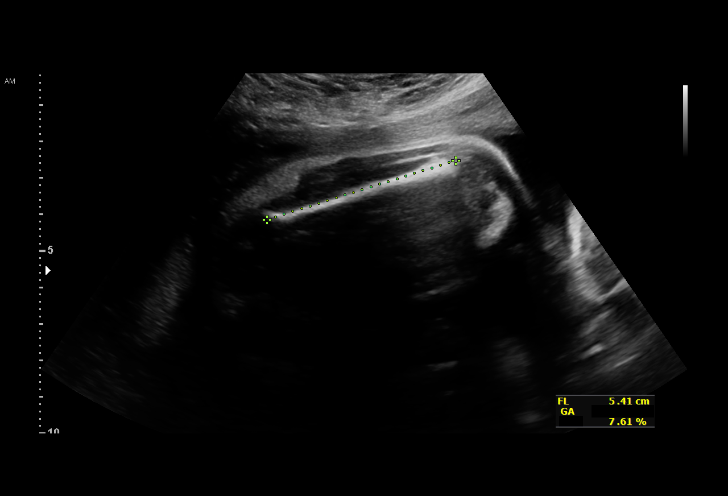
[im 20/20]
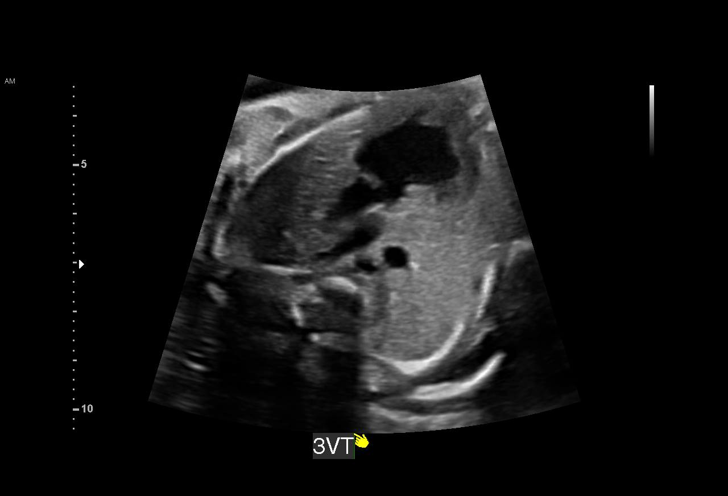

[13 of 20 positions shown; findings below may reference images not displayed]

[REDACTED]care
                   WALKER CNM

Indications

 Advanced maternal age multigravida 35+,
 third trimester
 Echogenic intracardiac focus of the heart
 (EIF)
 Uterine fibroids affecting pregnancy in first   O34.11,
 trimester, antepartum
 AFP Neg
 30 weeks gestation of pregnancy
Fetal Evaluation

 Num Of Fetuses:          1
 Fetal Heart Rate(bpm):   162
 Cardiac Activity:        Observed
 Presentation:            Cephalic
 Placenta:                Posterior
 P. Cord Insertion:       Previously Visualized

 Amniotic Fluid
 AFI FV:      Within normal limits

 AFI Sum(cm)     %Tile       Largest Pocket(cm)
 14.75           51

 RUQ(cm)       RLQ(cm)       LUQ(cm)        LLQ(cm)

Biometry
 BPD:      74.3  mm     G. Age:  29w 6d         32  %    CI:        70.81   %    70 - 86
                                                         FL/HC:       19.1  %    19.2 -
 HC:      281.4  mm     G. Age:  30w 6d         38  %    HC/AC:       1.08       0.99 -
 AC:      260.4  mm     G. Age:  30w 1d         51  %    FL/BPD:      72.3  %    71 - 87
 FL:       53.7  mm     G. Age:  28w 3d          6  %    FL/AC:       20.6  %    20 - 24

 LV:        4.9  mm

 Est. FW:    2636   gm     3 lb 3 oz     26  %
OB History

 Gravidity:    6         Term:   2        Prem:   0        SAB:   1
 TOP:          0       Ectopic:  0        Living: 2
Gestational Age

 LMP:           29w 2d        Date:  11/15/20                  EDD:   08/22/21
 U/S Today:     29w 6d                                        EDD:   08/18/21
 Best:          30w 0d     Det. By:  Previous Ultrasound      EDD:   08/17/21
                                     (02/06/21)
Anatomy

 Cranium:               Appears normal         Aortic Arch:            Previously seen
 Cavum:                 Previously seen        Ductal Arch:            Previously seen
 Ventricles:            Appears normal         Diaphragm:              Previously seen
 Choroid Plexus:        Previously seen        Stomach:                Appears normal, left
                                                                       sided
 Cerebellum:            Previously seen        Abdomen:                Previously seen
 Posterior Fossa:       Previously seen        Abdominal Wall:         Previously seen
 Nuchal Fold:           Not applicable (>20    Cord Vessels:           Previously seen
                        wks GA)
 Face:                  Orbits and profile     Kidneys:                Appear normal
                        previously seen
 Lips:                  Previously seen        Bladder:                Appears normal
 Thoracic:              Previously seen        Spine:                  Previously seen
 Heart:                 Appears normal; EIF    Upper Extremities:      Previously seen
 RVOT:                  Previously seen        Lower Extremities:      Previously seen
 LVOT:                  Previously seen

 Other:  3VTV visualized. Fetus appears to be male. Heels/feet, open
         hands/5th digits, VC, 3VV and 3VTV previously visualized.
Cervix Uterus Adnexa

 Cervix
 Not visualized (advanced GA >15wks)

 Right Ovary
 Visualized.

 Left Ovary
 Visualized.
Impression
 Advanced maternal age.  Patient return for fetal growth
 assessment.  She is screening for gestational diabetes today.
 Blood pressure today at her office is 125/63 mmHg.
 Fetal growth is appropriate for gestational age.  Amniotic fluid
 is normal and good fetal activity seen.  The myoma could not
 be assessed on today's ultrasound.
 I reassured the patient of the findings.
Recommendations

 -No follow-up appointments were made .
                Tiger, Keyli

## 2023-09-12 ENCOUNTER — Other Ambulatory Visit: Payer: Self-pay

## 2023-09-12 ENCOUNTER — Emergency Department (HOSPITAL_BASED_OUTPATIENT_CLINIC_OR_DEPARTMENT_OTHER)
Admission: EM | Admit: 2023-09-12 | Discharge: 2023-09-12 | Disposition: A | Discharge status: Home / Self Care | Attending: Emergency Medicine | Admitting: Emergency Medicine

## 2023-09-12 ENCOUNTER — Encounter (HOSPITAL_BASED_OUTPATIENT_CLINIC_OR_DEPARTMENT_OTHER): Payer: Self-pay

## 2023-09-12 DIAGNOSIS — Z85528 Personal history of other malignant neoplasm of kidney: Secondary | ICD-10-CM | POA: Diagnosis not present

## 2023-09-12 DIAGNOSIS — R42 Dizziness and giddiness: Secondary | ICD-10-CM | POA: Diagnosis not present

## 2023-09-12 DIAGNOSIS — I10 Essential (primary) hypertension: Secondary | ICD-10-CM | POA: Insufficient documentation

## 2023-09-12 DIAGNOSIS — Z79899 Other long term (current) drug therapy: Secondary | ICD-10-CM | POA: Insufficient documentation

## 2023-09-12 DIAGNOSIS — R1110 Vomiting, unspecified: Secondary | ICD-10-CM | POA: Insufficient documentation

## 2023-09-12 DIAGNOSIS — R519 Headache, unspecified: Secondary | ICD-10-CM | POA: Insufficient documentation

## 2023-09-12 DIAGNOSIS — R5383 Other fatigue: Secondary | ICD-10-CM | POA: Insufficient documentation

## 2023-09-12 LAB — PREGNANCY, URINE: Preg Test, Ur: NEGATIVE

## 2023-09-12 LAB — URINALYSIS, ROUTINE W REFLEX MICROSCOPIC
Bilirubin Urine: NEGATIVE
Glucose, UA: NEGATIVE mg/dL
Ketones, ur: 40 mg/dL — AB
Leukocytes,Ua: NEGATIVE
Nitrite: NEGATIVE
Protein, ur: 100 mg/dL — AB
Specific Gravity, Urine: >=1.03 (ref 1.005–1.030)
pH: 6 (ref 5.0–8.0)

## 2023-09-12 LAB — CBC WITH DIFFERENTIAL/PLATELET
Abs Immature Granulocytes: 0.04 10*3/uL (ref 0.00–0.07)
Basophils Absolute: 0 10*3/uL (ref 0.0–0.1)
Basophils Relative: 0 %
Eosinophils Absolute: 0 10*3/uL (ref 0.0–0.5)
Eosinophils Relative: 0 %
HCT: 43.9 % (ref 36.0–46.0)
Hemoglobin: 14.6 g/dL (ref 12.0–15.0)
Immature Granulocytes: 0 %
Lymphocytes Relative: 6 %
Lymphs Abs: 0.7 10*3/uL (ref 0.7–4.0)
MCH: 29.3 pg (ref 26.0–34.0)
MCHC: 33.3 g/dL (ref 30.0–36.0)
MCV: 88 fL (ref 80.0–100.0)
Monocytes Absolute: 0.2 10*3/uL (ref 0.1–1.0)
Monocytes Relative: 2 %
Neutro Abs: 10 10*3/uL — ABNORMAL HIGH (ref 1.7–7.7)
Neutrophils Relative %: 92 %
Platelets: 291 10*3/uL (ref 150–400)
RBC: 4.99 MIL/uL (ref 3.87–5.11)
RDW: 12.9 % (ref 11.5–15.5)
WBC: 10.9 10*3/uL — ABNORMAL HIGH (ref 4.0–10.5)
nRBC: 0 % (ref 0.0–0.2)

## 2023-09-12 LAB — COMPREHENSIVE METABOLIC PANEL WITH GFR
ALT: 25 U/L (ref 0–44)
AST: 40 U/L (ref 15–41)
Albumin: 4.9 g/dL (ref 3.5–5.0)
Alkaline Phosphatase: 80 U/L (ref 38–126)
Anion gap: 15 (ref 5–15)
BUN: 14 mg/dL (ref 6–20)
CO2: 23 mmol/L (ref 22–32)
Calcium: 9.8 mg/dL (ref 8.9–10.3)
Chloride: 99 mmol/L (ref 98–111)
Creatinine, Ser: 0.95 mg/dL (ref 0.44–1.00)
GFR, Estimated: >60 mL/min (ref >60)
Glucose, Bld: 123 mg/dL — ABNORMAL HIGH (ref 70–99)
Potassium: 4.1 mmol/L (ref 3.5–5.1)
Sodium: 137 mmol/L (ref 135–145)
Total Bilirubin: 0.3 mg/dL (ref 0.0–1.2)
Total Protein: 8.4 g/dL — ABNORMAL HIGH (ref 6.5–8.1)

## 2023-09-12 LAB — URINALYSIS, MICROSCOPIC (REFLEX)

## 2023-09-12 MED ORDER — DIPHENHYDRAMINE HCL 50 MG/ML IJ SOLN
12.5000 mg | Freq: Once | INTRAMUSCULAR | Status: AC
  Administered 2023-09-12: 12.5 mg via INTRAVENOUS
  Filled 2023-09-12: qty 1

## 2023-09-12 MED ORDER — METOCLOPRAMIDE HCL 5 MG/ML IJ SOLN
10.0000 mg | Freq: Once | INTRAMUSCULAR | Status: AC
  Administered 2023-09-12: 10 mg via INTRAVENOUS
  Filled 2023-09-12: qty 2

## 2023-09-12 MED ORDER — SODIUM CHLORIDE 0.9 % IV BOLUS
1000.0000 mL | Freq: Once | INTRAVENOUS | Status: AC
  Administered 2023-09-12: 1000 mL via INTRAVENOUS

## 2023-09-12 MED ORDER — PROMETHAZINE HCL 25 MG PO TABS: 25.0000 mg | ORAL_TABLET | Freq: Four times a day (QID) | ORAL | 0 refills | Status: AC | PRN

## 2023-09-12 MED ORDER — DEXAMETHASONE 4 MG PO TABS
10.0000 mg | ORAL_TABLET | Freq: Once | ORAL | Status: AC
  Administered 2023-09-12: 10 mg via ORAL
  Filled 2023-09-12: qty 3

## 2023-09-12 NOTE — ED Provider Notes (Signed)
 [facility] Provider Note   CSN: [id number] Arrival date & time: 09/12/23  0037     Patient presents with: Headache   [name] [name] is a 43 y.o. female.   The history is provided by the patient and a relative.  Headache [name] [name] is a 43 y.o. female who presents to the Emergency Department complaining of headache.  She presents to the emergency department for evaluation of headache that started Wednesday night with gradual onset.  When she woke up Thursday morning she tried to take some Tylenol  for it and started vomiting.  She has experienced dizziness and fatigue with photophobia throughout the day.  She has experienced ongoing emesis.  No fever.  She does have a history of migraine headaches, this is more intense than prior headaches.  She also has a history of renal cell carcinoma in 2022 status post rib resection, anxiety, hypertension.       Prior to Admission medications   Medication Sig Start Date End Date Taking? Authorizing Provider  promethazine  (PHENERGAN ) 25 MG tablet Take 1 tablet (25 mg total) by mouth every 6 (six) hours as needed for nausea or vomiting. 09/12/23  Yes Griselda Norris, MD  acetaminophen  (TYLENOL ) 325 MG tablet Take 2 tablets (650 mg total) by mouth every 4 (four) hours as needed (for pain scale < 4). Patient not taking: Reported on 09/05/2021 07/29/21   Cleatus Moccasin, MD  amLODipine  (NORVASC ) 5 MG tablet Take 2 tablets (10 mg total) by mouth daily. 07/30/22 09/28/22  Waylan Elsie PARAS, PA-C  fluticasone (FLONASE) 50 MCG/ACT nasal spray Place into both nostrils daily.    [provider]  NIFEdipine  (ADALAT  CC) 60 MG 24 hr tablet Take 1 tablet (60 mg total) by mouth daily. 09/05/21   Vannie Cornell SAUNDERS, CNM  oxyCODONE  (OXY IR/ROXICODONE ) 5 MG immediate release tablet Take 1 tablet (5 mg total) by mouth every 4 (four) hours as needed (pain scale 4-7). Patient not taking: Reported on 09/05/2021 07/29/21    Cleatus Moccasin, MD  Prenatal MV-Min-Fe Fum-FA-DHA (PRENATAL 1 PO) Take 1 tablet by mouth daily.    [provider]  sertraline  (ZOLOFT ) 25 MG tablet Take 1 tablet (25 mg total) by mouth daily. 03/28/21   Vannie Cornell R, CNM  sucralfate  (CARAFATE ) 1 g tablet Take 1 tablet (1 g total) by mouth 4 (four) times daily -  with meals and at bedtime for 7 days. 09/26/21 10/03/21  Elnor Jayson LABOR, DO    Allergies: Lisinopril , Sulfa antibiotics, Bactrim [sulfamethoxazole-trimethoprim], Dust mite extract, and Penicillins    Review of Systems  Neurological:  Positive for headaches.  All other systems reviewed and are negative.   Updated Vital Signs BP (!) 144/88   Pulse 78   Temp 98.2 F (36.8 C) (Oral)   Resp 15   Ht 5' (1.524 m)   Wt 68 kg   SpO2 100%   Breastfeeding Yes   BMI 29.29 kg/m   Physical Exam Vitals and nursing note reviewed.  Constitutional:      Appearance: She is well-developed.  HENT:     Head: Normocephalic and atraumatic.   Cardiovascular:     Rate and Rhythm: Normal rate and regular rhythm.     Heart sounds: No murmur heard. Pulmonary:     Effort: Pulmonary effort is normal. No respiratory distress.     Breath sounds: Normal breath sounds.  Abdominal:     Palpations: Abdomen is soft.     Tenderness:  There is no abdominal tenderness. There is no guarding or rebound.   Musculoskeletal:        General: No tenderness.   Skin:    General: Skin is warm and dry.   Neurological:     Mental Status: She is alert and oriented to person, place, and time.   Psychiatric:        Behavior: Behavior normal.     (all labs ordered are listed, but only abnormal results are displayed) Labs Reviewed  CBC WITH DIFFERENTIAL/PLATELET - Abnormal; Notable for the following components:      Result Value   WBC 10.9 (*)    Neutro Abs 10.0 (*)    All other components within normal limits  COMPREHENSIVE METABOLIC PANEL WITH GFR - Abnormal; Notable for the following  components:   Glucose, Bld 123 (*)    Total Protein 8.4 (*)    All other components within normal limits  URINALYSIS, ROUTINE W REFLEX MICROSCOPIC - Abnormal; Notable for the following components:   APPearance HAZY (*)    Hgb urine dipstick TRACE (*)    Ketones, ur 40 (*)    Protein, ur 100 (*)    All other components within normal limits  URINALYSIS, MICROSCOPIC (REFLEX) - Abnormal; Notable for the following components:   Bacteria, UA FEW (*)    All other components within normal limits  PREGNANCY, URINE    EKG: None  Radiology: No results found.   Procedures   Medications Ordered in the ED  sodium chloride  0.9 % bolus 1,000 mL (0 mLs Intravenous Stopped 09/12/23 0453)  metoCLOPramide  (REGLAN ) injection 10 mg (10 mg Intravenous Given 09/12/23 0342)  diphenhydrAMINE  (BENADRYL ) injection 12.5 mg (12.5 mg Intravenous Given 09/12/23 0342)  dexamethasone  (DECADRON ) tablet 10 mg (10 mg Oral Given 09/12/23 0453)                                    Medical Decision Making Amount and/or Complexity of Data Reviewed Labs: ordered.  Risk Prescription drug management.   Patient with history of migraines, renal cell carcinoma here for evaluation of headache, vomiting.  She is nontoxic-appearing on evaluation with no focal neurologic deficits.  UA not consistent with UTI.  Labs are unremarkable.  She was treated with medications in the emergency department with improvement in her symptoms.  Will provide a dose of Decadron  to prevent rebound headache.  Will prescribe promethazine  if she were to develop recurrent nausea and vomiting.  Discussed outpatient follow-up for headaches with return precautions. Current clinical picture is not consistent with CVA, meningitis, subarachnoid hemorrhage, dural sinus thrombosis.    Final diagnoses:  Bad headache    ED Discharge Orders          Ordered    promethazine  (PHENERGAN ) 25 MG tablet  Every 6 hours PRN        09/12/23 0448                Griselda Norris, MD 09/12/23 0510

## 2023-09-12 NOTE — ED Triage Notes (Signed)
 Pt presents via POV c/o N/V and headache. Report is unable to keep anything down including BP medication.   Reports throbbing headache.
